# Patient Record
Sex: Female | Born: 1940 | Race: Asian | Hispanic: No | Smoking: Never smoker
Health system: Southern US, Community
[De-identification: ages and names within clinical notes are randomized; demographics above are authoritative.]

## PROBLEM LIST (undated history)

## (undated) DIAGNOSIS — I5042 Chronic combined systolic (congestive) and diastolic (congestive) heart failure: Secondary | ICD-10-CM

## (undated) DIAGNOSIS — E785 Hyperlipidemia, unspecified: Secondary | ICD-10-CM

## (undated) DIAGNOSIS — N183 Chronic kidney disease, stage 3 (moderate): Secondary | ICD-10-CM

## (undated) DIAGNOSIS — N2889 Other specified disorders of kidney and ureter: Secondary | ICD-10-CM

## (undated) DIAGNOSIS — D649 Anemia, unspecified: Secondary | ICD-10-CM

## (undated) DIAGNOSIS — R945 Abnormal results of liver function studies: Secondary | ICD-10-CM

## (undated) DIAGNOSIS — R079 Chest pain, unspecified: Secondary | ICD-10-CM

## (undated) DIAGNOSIS — R7303 Prediabetes: Secondary | ICD-10-CM

## (undated) DIAGNOSIS — I639 Cerebral infarction, unspecified: Secondary | ICD-10-CM

## (undated) DIAGNOSIS — G459 Transient cerebral ischemic attack, unspecified: Secondary | ICD-10-CM

## (undated) DIAGNOSIS — R001 Bradycardia, unspecified: Secondary | ICD-10-CM

## (undated) DIAGNOSIS — R7989 Other specified abnormal findings of blood chemistry: Secondary | ICD-10-CM

## (undated) DIAGNOSIS — I340 Nonrheumatic mitral (valve) insufficiency: Secondary | ICD-10-CM

## (undated) HISTORY — DX: Hyperlipidemia, unspecified: E78.5

## (undated) HISTORY — DX: Other specified disorders of kidney and ureter: N28.89

## (undated) HISTORY — DX: Chest pain, unspecified: R07.9

## (undated) HISTORY — DX: Anemia, unspecified: D64.9

## (undated) HISTORY — DX: Bradycardia, unspecified: R00.1

## (undated) HISTORY — PX: APPENDECTOMY: SHX54

## (undated) HISTORY — DX: Chronic combined systolic (congestive) and diastolic (congestive) heart failure: I50.42

## (undated) HISTORY — DX: Cerebral infarction, unspecified: I63.9

## (undated) HISTORY — DX: Other specified abnormal findings of blood chemistry: R79.89

## (undated) HISTORY — DX: Chronic kidney disease, stage 3 (moderate): N18.3

## (undated) HISTORY — DX: Nonrheumatic mitral (valve) insufficiency: I34.0

## (undated) HISTORY — DX: Abnormal results of liver function studies: R94.5

---

## 2015-09-03 HISTORY — PX: OTHER SURGICAL HISTORY: SHX169

## 2017-12-27 ENCOUNTER — Inpatient Hospital Stay (HOSPITAL_COMMUNITY)
Admission: EM | Admit: 2017-12-27 | Discharge: 2017-12-29 | DRG: 065 | Disposition: A | Discharge status: Home Health | Payer: Medicaid Other | Attending: Internal Medicine | Admitting: Internal Medicine

## 2017-12-27 ENCOUNTER — Emergency Department (HOSPITAL_COMMUNITY): Payer: Medicaid Other

## 2017-12-27 ENCOUNTER — Inpatient Hospital Stay (HOSPITAL_COMMUNITY): Payer: Medicaid Other

## 2017-12-27 ENCOUNTER — Other Ambulatory Visit: Payer: Self-pay

## 2017-12-27 ENCOUNTER — Encounter (HOSPITAL_COMMUNITY): Payer: Self-pay | Admitting: Emergency Medicine

## 2017-12-27 DIAGNOSIS — I63511 Cerebral infarction due to unspecified occlusion or stenosis of right middle cerebral artery: Secondary | ICD-10-CM | POA: Diagnosis not present

## 2017-12-27 DIAGNOSIS — I248 Other forms of acute ischemic heart disease: Secondary | ICD-10-CM | POA: Diagnosis present

## 2017-12-27 DIAGNOSIS — E1122 Type 2 diabetes mellitus with diabetic chronic kidney disease: Secondary | ICD-10-CM | POA: Diagnosis present

## 2017-12-27 DIAGNOSIS — R1310 Dysphagia, unspecified: Secondary | ICD-10-CM | POA: Diagnosis present

## 2017-12-27 DIAGNOSIS — I517 Cardiomegaly: Secondary | ICD-10-CM | POA: Diagnosis present

## 2017-12-27 DIAGNOSIS — R7989 Other specified abnormal findings of blood chemistry: Secondary | ICD-10-CM

## 2017-12-27 DIAGNOSIS — R471 Dysarthria and anarthria: Secondary | ICD-10-CM | POA: Diagnosis present

## 2017-12-27 DIAGNOSIS — R0609 Other forms of dyspnea: Secondary | ICD-10-CM | POA: Diagnosis present

## 2017-12-27 DIAGNOSIS — R748 Abnormal levels of other serum enzymes: Secondary | ICD-10-CM

## 2017-12-27 DIAGNOSIS — R5383 Other fatigue: Secondary | ICD-10-CM | POA: Diagnosis not present

## 2017-12-27 DIAGNOSIS — I10 Essential (primary) hypertension: Secondary | ICD-10-CM

## 2017-12-27 DIAGNOSIS — R2681 Unsteadiness on feet: Secondary | ICD-10-CM | POA: Diagnosis present

## 2017-12-27 DIAGNOSIS — Z79899 Other long term (current) drug therapy: Secondary | ICD-10-CM

## 2017-12-27 DIAGNOSIS — G8194 Hemiplegia, unspecified affecting left nondominant side: Secondary | ICD-10-CM | POA: Diagnosis present

## 2017-12-27 DIAGNOSIS — N183 Chronic kidney disease, stage 3 (moderate): Secondary | ICD-10-CM | POA: Diagnosis present

## 2017-12-27 DIAGNOSIS — R29701 NIHSS score 1: Secondary | ICD-10-CM | POA: Diagnosis present

## 2017-12-27 DIAGNOSIS — H538 Other visual disturbances: Secondary | ICD-10-CM | POA: Diagnosis present

## 2017-12-27 DIAGNOSIS — R531 Weakness: Secondary | ICD-10-CM | POA: Diagnosis not present

## 2017-12-27 DIAGNOSIS — R29810 Facial weakness: Secondary | ICD-10-CM | POA: Diagnosis present

## 2017-12-27 DIAGNOSIS — R4701 Aphasia: Secondary | ICD-10-CM | POA: Diagnosis present

## 2017-12-27 DIAGNOSIS — R778 Other specified abnormalities of plasma proteins: Secondary | ICD-10-CM

## 2017-12-27 DIAGNOSIS — G479 Sleep disorder, unspecified: Secondary | ICD-10-CM | POA: Diagnosis present

## 2017-12-27 DIAGNOSIS — I639 Cerebral infarction, unspecified: Secondary | ICD-10-CM | POA: Diagnosis not present

## 2017-12-27 DIAGNOSIS — R079 Chest pain, unspecified: Secondary | ICD-10-CM

## 2017-12-27 DIAGNOSIS — I129 Hypertensive chronic kidney disease with stage 1 through stage 4 chronic kidney disease, or unspecified chronic kidney disease: Secondary | ICD-10-CM | POA: Diagnosis present

## 2017-12-27 HISTORY — DX: Other specified abnormalities of plasma proteins: R77.8

## 2017-12-27 HISTORY — DX: Transient cerebral ischemic attack, unspecified: G45.9

## 2017-12-27 HISTORY — DX: Other specified abnormal findings of blood chemistry: R79.89

## 2017-12-27 HISTORY — DX: Prediabetes: R73.03

## 2017-12-27 LAB — CBC
HCT: 36.4 % (ref 36.0–46.0)
Hemoglobin: 12.1 g/dL (ref 12.0–15.0)
MCH: 32 pg (ref 26.0–34.0)
MCHC: 33.2 g/dL (ref 30.0–36.0)
MCV: 96.3 fL (ref 78.0–100.0)
PLATELETS: 180 10*3/uL (ref 150–400)
RBC: 3.78 MIL/uL — AB (ref 3.87–5.11)
RDW: 13.5 % (ref 11.5–15.5)
WBC: 6.5 10*3/uL (ref 4.0–10.5)

## 2017-12-27 LAB — I-STAT CHEM 8, ED
BUN: 17 mg/dL (ref 6–20)
CALCIUM ION: 1.08 mmol/L — AB (ref 1.15–1.40)
CHLORIDE: 103 mmol/L (ref 101–111)
Creatinine, Ser: 1.1 mg/dL — ABNORMAL HIGH (ref 0.44–1.00)
Glucose, Bld: 107 mg/dL — ABNORMAL HIGH (ref 65–99)
HCT: 37 % (ref 36.0–46.0)
Hemoglobin: 12.6 g/dL (ref 12.0–15.0)
POTASSIUM: 4.2 mmol/L (ref 3.5–5.1)
SODIUM: 139 mmol/L (ref 135–145)
TCO2: 26 mmol/L (ref 22–32)

## 2017-12-27 LAB — PROTIME-INR
INR: 1.12
PROTHROMBIN TIME: 14.3 s (ref 11.4–15.2)

## 2017-12-27 LAB — DIFFERENTIAL
Basophils Absolute: 0 10*3/uL (ref 0.0–0.1)
Basophils Relative: 1 %
EOS PCT: 1 %
Eosinophils Absolute: 0 10*3/uL (ref 0.0–0.7)
LYMPHS PCT: 32 %
Lymphs Abs: 2.1 10*3/uL (ref 0.7–4.0)
Monocytes Absolute: 0.5 10*3/uL (ref 0.1–1.0)
Monocytes Relative: 8 %
NEUTROS ABS: 3.8 10*3/uL (ref 1.7–7.7)
NEUTROS PCT: 58 %

## 2017-12-27 LAB — COMPREHENSIVE METABOLIC PANEL
ALBUMIN: 3.5 g/dL (ref 3.5–5.0)
ALK PHOS: 86 U/L (ref 38–126)
ALT: 25 U/L (ref 14–54)
ANION GAP: 10 (ref 5–15)
AST: 41 U/L (ref 15–41)
BUN: 16 mg/dL (ref 6–20)
CALCIUM: 9 mg/dL (ref 8.9–10.3)
CHLORIDE: 101 mmol/L (ref 101–111)
CO2: 24 mmol/L (ref 22–32)
Creatinine, Ser: 1.16 mg/dL — ABNORMAL HIGH (ref 0.44–1.00)
GFR calc Af Amer: 52 mL/min — ABNORMAL LOW (ref >60)
GFR calc non Af Amer: 45 mL/min — ABNORMAL LOW (ref >60)
Glucose, Bld: 108 mg/dL — ABNORMAL HIGH (ref 65–99)
Potassium: 4.1 mmol/L (ref 3.5–5.1)
SODIUM: 135 mmol/L (ref 135–145)
Total Bilirubin: 1.6 mg/dL — ABNORMAL HIGH (ref 0.3–1.2)
Total Protein: 7.7 g/dL (ref 6.5–8.1)

## 2017-12-27 LAB — TROPONIN I
TROPONIN I: 0.1 ng/mL — AB (ref <0.03)
TROPONIN I: 0.13 ng/mL — AB (ref <0.03)

## 2017-12-27 LAB — APTT: aPTT: 31 seconds (ref 24–36)

## 2017-12-27 LAB — I-STAT TROPONIN, ED: Troponin i, poc: 0.1 ng/mL (ref 0.00–0.08)

## 2017-12-27 MED ORDER — IOPAMIDOL (ISOVUE-370) INJECTION 76%
50.0000 mL | Freq: Once | INTRAVENOUS | Status: AC | PRN
  Administered 2017-12-27: 50 mL via INTRAVENOUS

## 2017-12-27 MED ORDER — SODIUM CHLORIDE 0.9 % IV SOLN
INTRAVENOUS | Status: DC
  Administered 2017-12-27: 50 mL/h via INTRAVENOUS
  Administered 2017-12-28: 18:00:00 via INTRAVENOUS

## 2017-12-27 MED ORDER — ASPIRIN EC 81 MG PO TBEC
81.0000 mg | DELAYED_RELEASE_TABLET | Freq: Every day | ORAL | Status: DC
Start: 2017-12-27 — End: 2017-12-29
  Administered 2017-12-29: 81 mg via ORAL
  Filled 2017-12-27: qty 1

## 2017-12-27 MED ORDER — STROKE: EARLY STAGES OF RECOVERY BOOK
Freq: Once | Status: DC
  Filled 2017-12-27: qty 1

## 2017-12-27 MED ORDER — ASPIRIN 325 MG PO TABS: 325.0000 mg | ORAL_TABLET | Freq: Every day | ORAL | Status: DC

## 2017-12-27 MED ORDER — ASPIRIN 300 MG RE SUPP
300.0000 mg | Freq: Every day | RECTAL | Status: DC
  Administered 2017-12-27: 300 mg via RECTAL
  Filled 2017-12-27: qty 1

## 2017-12-27 MED ORDER — ATORVASTATIN CALCIUM 40 MG PO TABS
40.0000 mg | ORAL_TABLET | Freq: Every day | ORAL | Status: DC
  Administered 2017-12-28: 40 mg via ORAL
  Filled 2017-12-27 (×2): qty 1

## 2017-12-27 MED ORDER — IOPAMIDOL (ISOVUE-370) INJECTION 76%
INTRAVENOUS | Status: AC
  Filled 2017-12-27: qty 50

## 2017-12-27 NOTE — ED Triage Notes (Signed)
Family reports Pt had surgery on the Lt  Index finger and  Lt middle finger . Pt normal base line is to not bend those two fingers.  The grip is weak with the remaining fingers.

## 2017-12-27 NOTE — Consult Note (Signed)
Patient ID: Mikayla Reynolds MRN: [id number], DOB/AGE: 76-Aug-1942  Admit date: 12/27/2017 Primary Physician: Patient, No Pcp Per Primary Cardiologist: None  CARDIOLOGY ADMISSION History & Physical   HISTORY OF PRESENT ILLNESS   77 year old woman with history of TIA, hypertension, who presents to the ED with acute CVA. Cardiology is consulted for evaluation of elevated troponin.  Patient is [place]. Family interpreted for her. Thursday she was found to have confusion, slurred speech, and left sided weakness. She refused to seek medical care because she thought she would improve, but today when her symptoms persisted they brought her to ED. 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  She is comfortable and pleasant. No CP, SOB, edema, PND, syncope or presyncope. She lives with her son. She recently moved from [place]. She rarely leaves the house and is not physically active. She had a cold two weeks ago with some chest discomfort and cough, but that subsequently improved.  Review of Systems General:  No chills, fever, night sweats or weight changes.  Cardiovascular: see above Dermatological: No rash, lesions/masses Respiratory: see above Urologic: No hematuria, dysuria Abdominal:   No nausea, vomiting, diarrhea, bright red blood per rectum, melena, or hematemesis Neurologic:  See above All other systems reviewed and are otherwise negative except as noted above.   MEDICAL, FAMILY, AND SOCIAL HISTORY   Past Medical History:  Diagnosis Date  . Pre-diabetes   . TIA (transient ischemic attack)     Past Surgical History:  Procedure Laterality Date  . APPENDECTOMY      No Known Allergies Prior to Admission medications   Medication Sig Start Date End Date Taking? Authorizing Provider  Omega-3 Fatty Acids (FISH OIL PO) Take 2 capsules by mouth every evening.   Yes [provider]  PRESCRIPTION MEDICATION Take 20 mg by mouth See admin instructions. "Vastarel" 20 mg -trimetazidine  dihydrochloride - from [place] pharmacy/ take one tablet (20 mg) twice daily   Yes [provider]  PRESCRIPTION MEDICATION Take 4 mg by mouth See admin instructions. Candesartan 8 mg - from [place] pharmacy - take 1/2 tablet (4 mg) by mouth every morning   Yes [provider]  PRESCRIPTION MEDICATION Take 0.25 mg by mouth See admin instructions. Alprazolam 0.5 mg - from [place] pharmacy/ take 1/2 tablet (0.25 mg) by mouth daily at bedtime   Yes [provider]   History reviewed. No pertinent family history. Social History   Socioeconomic History  . Marital status: Widowed    Spouse name: Not on file  . Number of children: Not on file  . Years of education: Not on file  . Highest education level: Not on file  Occupational History  . Not on file  Social Needs  . Financial resource strain: Not on file  . Food insecurity:    Worry: Not on file    Inability: Not on file  . Transportation needs:    Medical: Not on file    Non-medical: Not on file  Tobacco Use  . Smoking status: Never Smoker  . Smokeless tobacco: Never Used  Substance and Sexual Activity  . Alcohol use: Not on file  . Drug use: Not on file  . Sexual activity: Not on file  Lifestyle  . Physical activity:    Days per week: Not on file    Minutes per session: Not on file  . Stress: Not on file  Relationships  . Social connections:    Talks on phone: Not on file  Gets together: Not on file    Attends religious service: Not on file    Active member of club or organization: Not on file    Attends meetings of clubs or organizations: Not on file    Relationship status: Not on file  . Intimate partner violence:    Fear of current or ex partner: Not on file    Emotionally abused: Not on file    Physically abused: Not on file    Forced sexual activity: Not on file  Other Topics Concern  . Not on file  Social History Narrative  . Not on file      PHYSICAL EXAM  Blood pressure 119/70,  pulse 61, temperature 97.8 F (36.6 C), temperature source Oral, resp. rate 20, height 5\' 3"  (1.6 m), weight 54.2 kg (119 lb 7.8 oz), SpO2 98 %.  General: Pleasant, NAD Psych: Normal affect. Neuro: left sided facial droop. HEENT: Sclera anicteric, noninjected. MMM.  Neck: JVP is not elevated. Lungs:  clear Heart: RRR no m/r/g Abdomen: Soft, non-tender, non-distended, BS + x 4.  Extremities: No clubbing or cyanosis. Edema: none. DP/PT/Radials 2+ and equal bilaterally.   LABS and STUDIES  Troponin (Point of Care Test) Recent Labs    12/27/17 1315  TROPIPOC 0.10*   Recent Labs    12/27/17 1630 12/27/17 2139  TROPONINI 0.10* 0.13*   Lab Results  Component Value Date   WBC 6.5 12/27/2017   HGB 12.6 12/27/2017   HCT 37.0 12/27/2017   MCV 96.3 12/27/2017   PLT 180 12/27/2017    Recent Labs  Lab 12/27/17 1252 12/27/17 1316  NA 135 139  K 4.1 4.2  CL 101 103  CO2 24  --   BUN 16 17  CREATININE 1.16* 1.10*  CALCIUM 9.0  --   PROT 7.7  --   BILITOT 1.6*  --   ALKPHOS 86  --   ALT 25  --   AST 41  --   GLUCOSE 108* 107*   No results found for: CHOL, HDL, LDLCALC, TRIG No results found for: DDIMER   Radiology/Studies. Independently Reviewed CXR shows clear lungs  ECG was independently reviewed. Sinus with PACs    ASSESSMENT and PLAN   77 year old woman with history of HTN, TIAs, who presents with MCA infarct. Troponin mildly elevated.  Mild troponin elevation. May be 2/2 demand ischemia (CVA). No signs of active ischemia or acute coronary syndrome. - Aspirin 81 mg daily - Atorvastatin 80 mg daily or other high potency statin - Please check lipids - Do not recommend heparin given low suspicion for ACS and potential for hemorrhagic conversion of recent stroke - Echocardiogram ordered - Consider ischemic evaluation (inpatient vs outpatient) - Continue to trend troponin until downtrending (then stop)  Recent CVA Outside of window for TPA or intervention -  Recommendations per neurology - Will need to evaluate for arhythmia (afib)   Hypertension - Per primary team and neurology   PAULEY, ERIC D, MD 12/27/2017, 10:38 PM

## 2017-12-27 NOTE — ED Notes (Signed)
Neuro MD at bedside with family.

## 2017-12-27 NOTE — ED Triage Notes (Signed)
Pt speaks vietnamese. Thursday evening her family noted she had sudden onset slurred speech, some confusion, expressive aphasia, right sided facial droop, disoriented to month at present, thinks it is march. Unable to answer her age but can speak to DOB. Some left arm and left hand grip weakness noted. Pt family also states she has trouble swallowing.

## 2017-12-27 NOTE — Consult Note (Addendum)
Referring Physician: Gareth Morgan, MD    Chief Complaint: Aphasia; difficulty swallowing; and Altered Mental Status   HPI: Patriece Reynolds is an 77 y.o. [place] female with a past medical history of 2 previous TIAs, hypertension who presents to the ED with a 2-day complaint of left sided hemiparesis, left paresthesia, dysphagia, dysarthria left facial droop and impaired mobility.  Patient is [place] speaking and she refused for iPad [place] interpreter, and opted for granddaughter at the bedside to interpret for her. Per family, patient's son last saw her at 1 PM on Wednesday, 12/25/2017, he went out and returned around 8 PM.  At that time patient was confused, was speaking gibberish, with slurred speech, left-sided facial droop, unsteady gait but was still walking around as well as left sided weakness.  At baseline patient is left-handed, when she was eating dinner she was unable to hold the spoon in her left hand and had drooling of the fluid out of the left side of her mouth with some difficulty swallowing.  Patient refused to come to the ER per family but they just had to bring her in today due to the persistency of her symptoms. Per patient through interpretation by her granddaughter, she had a headache starting late Thursday, which continues to be persistent up until now, difficulty sleeping, blurred vision which has since resolved, denies trauma or falls. Per family patient has had 2 previous TIAs, first 1 in [place] 2 years ago with the exact same symptoms which resolved spontaneously, second one was about 9 months ago with the exact same symptoms, patient refused to go to the hospital at that time and by the time she had woken up the next morning symptoms had resolved. On admission to the ER patient is very compliant, does answer questions appropriately, per family continues to have slurred speech, but more lucid with more coherent speech.  Initial CT of the head done in the ER showed  acute right MCA distribution infarct.  Neuro was consulted for further evaluation.  Patient denies dizziness, nausea, vomiting she admits to intermittent chest pain and family says has some anxiety.  Troponin on admission was elevated at 0.1, EKG was normal sinus rhythm no ST-t wave changes consistent with ischemia, chest x-ray pending, normal electrolytes within normal limits.  LSN: 12/25/17 at 1pm tPA Given: No: Out of the time frame window  Premorbid modified Rankin scale (mRS): 1   Past Medical History:  Diagnosis Date  . Pre-diabetes   . TIA (transient ischemic attack)   HTN  Past Surgical History:  Procedure Laterality Date  . APPENDECTOMY     Family hx; Discussed with patient and family, deny hx of Stroke  Social History:  reports that she has never smoked. She has never used smokeless tobacco. Her alcohol and drug histories are not on file.  Allergies: No Known Allergies  Medications:  .  stroke: mapping our early stages of recovery book   Does not apply Once    ROS: 10 point systems reviewed with patient and are negative except for above in HPI.  Physical Examination: Blood pressure (!) 123/57, pulse (!) 59, temperature 98 F (36.7 C), resp. rate 20, height _0  (1.626 m), weight 60 kg (132 lb 4.4 oz), SpO2 99 %. HEENT-  Normocephalic, no lesions, without obvious abnormality.  Normal external eye and conjunctiva.   Cardiovascular- S1-S2 audible, pulses palpable throughout   Lungs-no rhonchi or wheezing noted, no excessive working breathing.  Saturations within normal limits Abdomen- All 4 quadrants  palpated and nontender Musculoskeletal-no joint tenderness, deformity or swelling Skin-warm and dry,   Neurological Examination Mental Status: Alert, oriented, to self place time and situation.  Patient is [place] speaking with mild dysarthria without evidence of aphasia.  Able to follow 3 step commands without difficulty. Cranial Nerves: II: Visual fields grossly  normal,  III,IV, VI: ptosis not present, extra-ocular motions intact bilaterally pupils equal, round, reactive to light and accommodation V,VII: Left facial droop with decreased sensation to sharp and dull, decreased sensation to light touch. VIII: Hearing intact to voice IX,X: uvula rises symmetrically XI: bilateral shoulder shrug XII: midline tongue extension Motor: Patient with good hip flexion, abduction and abduction bilaterally, at baseline patient is unable to bend left hand index and middle finger due to surgery in [place] many years ago.  Has impaired left hand 5 motor skills.   Right : Upper extremity   5/5    Left:     Upper extremity   3/5  Lower extremity   5/5     Lower extremity   4/5 Tone and bulk:normal tone throughout; no atrophy noted Sensory: Left hemiparesis Deep Tendon Reflexes: 2+ and symmetric throughout Plantars: Right: downgoing   Left: downgoing Cerebellar: normal finger-to-nose, normal rapid alternating movements and normal heel-to-shin test Gait: Not assessed secondary to safety    Results for orders placed or performed during the hospital encounter of 12/27/17 (from the past 48 hour(s))  Protime-INR     Status: None   Collection Time: 12/27/17 12:52 PM  Result Value Ref Range   Prothrombin Time 14.3 11.4 - 15.2 seconds   INR 1.12     Comment: Performed at [facility], [place] [address] [place], [place] [address]  APTT     Status: None   Collection Time: 12/27/17 12:52 PM  Result Value Ref Range   aPTT 31 24 - 36 seconds    Comment: Performed at [facility] [address] [place], [place] [address]  CBC     Status: Abnormal   Collection Time: 12/27/17 12:52 PM  Result Value Ref Range   WBC 6.5 4.0 - 10.5 K/uL   RBC 3.78 (L) 3.87 - 5.11 MIL/uL   Hemoglobin 12.1 12.0 - 15.0 g/dL   HCT 36.4 36.0 - 46.0 %   MCV 96.3 78.0 - 100.0 fL   MCH 32.0 26.0 - 34.0 pg   MCHC 33.2 30.0 - 36.0 g/dL   RDW 13.5 11.5 - 15.5 %   Platelets 180 150 -  400 K/uL    Comment: Performed at [facility], [place] [address] [place], [place] [address]  Differential     Status: None   Collection Time: 12/27/17 12:52 PM  Result Value Ref Range   Neutrophils Relative % 58 %   Neutro Abs 3.8 1.7 - 7.7 K/uL   Lymphocytes Relative 32 %   Lymphs Abs 2.1 0.7 - 4.0 K/uL   Monocytes Relative 8 %   Monocytes Absolute 0.5 0.1 - 1.0 K/uL   Eosinophils Relative 1 %   Eosinophils Absolute 0.0 0.0 - 0.7 K/uL   Basophils Relative 1 %   Basophils Absolute 0.0 0.0 - 0.1 K/uL    Comment: Performed at [facility] [address] [place],  [address]  Comprehensive metabolic panel     Status: Abnormal   Collection Time: 12/27/17 12:52 PM  Result Value Ref Range   Sodium 135 135 - 145 mmol/L   Potassium 4.1 3.5 -  5.1 mmol/L   Chloride 101 101 - 111 mmol/L   CO2 24 22 - 32 mmol/L   Glucose, Bld 108 (H) 65 - 99 mg/dL   BUN 16 6 - 20 mg/dL   Creatinine, Ser 1.16 (H) 0.44 - 1.00 mg/dL   Calcium 9.0 8.9 - 10.3 mg/dL   Total Protein 7.7 6.5 - 8.1 g/dL   Albumin 3.5 3.5 - 5.0 g/dL   AST 41 15 - 41 U/L   ALT 25 14 - 54 U/L   Alkaline Phosphatase 86 38 - 126 U/L   Total Bilirubin 1.6 (H) 0.3 - 1.2 mg/dL   GFR calc non Af Amer 45 (L) >60 mL/min   GFR calc Af Amer 52 (L) >60 mL/min    Comment: (NOTE) The eGFR has been calculated using the CKD EPI equation. This calculation has not been validated in all clinical situations. eGFR's persistently <60 mL/min signify possible Chronic Kidney Disease.    Anion gap 10 5 - 15    Comment: Performed at [facility] [address] [place], [place] [address]  I-stat troponin, ED     Status: Abnormal   Collection Time: 12/27/17  1:15 PM  Result Value Ref Range   Troponin i, poc 0.10 (HH) 0.00 - 0.08 ng/mL   Comment 3            Comment: Due to the release kinetics of cTnI, a negative result within the first hours of the onset of symptoms does not rule out myocardial infarction with  certainty. If myocardial infarction is still suspected, repeat the test at appropriate intervals.   I-Stat Chem 8, ED     Status: Abnormal   Collection Time: 12/27/17  1:16 PM  Result Value Ref Range   Sodium 139 135 - 145 mmol/L   Potassium 4.2 3.5 - 5.1 mmol/L   Chloride 103 101 - 111 mmol/L   BUN 17 6 - 20 mg/dL   Creatinine, Ser 1.10 (H) 0.44 - 1.00 mg/dL   Glucose, Bld 107 (H) 65 - 99 mg/dL   Calcium, Ion 1.08 (L) 1.15 - 1.40 mmol/L   TCO2 26 22 - 32 mmol/L   Hemoglobin 12.6 12.0 - 15.0 g/dL   HCT 37.0 36.0 - 46.0 %   Ct Head Wo Contrast 12/27/2017 IMPRESSION:  Acute right MCA distribution infarct. Moderate confluent infarct centered at the right insula and frontal operculum. Patchy infarct in the right frontal parietal cortex. Negative for hemorrhage. Electronically Signed   By: Monte Fantasia M.D.   On: 12/27/2017 14:53    Assessment: 77 y.o.  [place] female with a past medical history of 2 previous TIAs, hypertension who presents to the ED with a 2-day complaint of left sided hemiparesis, left paresthesia, dysphagia, dysarthria, left facial droop and impaired mobility.  1.  Left acute right MCA infarct.  Essentially started 2 days ago, and presented to the hospital today with symptoms above. CTA shows R M1 occlusion, unfortunately outside window for any intervention ( tPA or thrombectomy)  Will pursue full stroke work-up with MRI of the brain, echocardiogram to rule out cardiogenic source.  Continue secondary stroke prevention with aspirin 81 mg and atorvastatin 40 mg.  Continue early rehabilitation with physical therapy outpatient therapy and speech therapy.  Patient currently has reported dizziness aphasia per family, will recommend early swallow evaluation.  Continue monitoring on telemetry as well as neuro monitoring with frequent neuro checks.  Lipid panel and hemoglobin A1c pending Stroke Risk Factors - hyperlipidemia,  hypertension and 2 previous TIAs  2.  Hypertension 3.   Elevated troponin 4.  Prediabetes with glucose intolerance -  awaiting hemoglobin A1c  Plan: - HgbA1c, fasting lipid panel - CTA of the head and neck   - PT consult, OT consult, Speech consult - Echocardiogram - Prophylactic therapy-Antiplatelet med: Aspirin - dose 73m - Atorvastatin 436m-Check troponin every 6 hours x3 - Allow for permissive hypertension for the first 24-48h - only treat PRN if SBP >220 mmHg.  Blood pressures can be gradually normalized to SBP<140 upon discharge - Risk factor modification - Telemetry monitoring -  Frequent neuro checks   PeLetha CapeNP Neuro-hospitalist Team [phone]/27/2019, 4:01 PM     NEUROHOSPITALIST ADDENDUM Seen and examined the patient today. I have reviewed the contents of history and physical exam as documented by PA/ARNP/Resident and agree with above documentation.  I have discussed and formulated the above plan as documented. Edits to the note have been made as needed.    SuKarena Addisonroor MD [facility] [phone] If 7pm to 7am, please call on call as listed on AMION.

## 2017-12-27 NOTE — ED Provider Notes (Signed)
[facility] EMERGENCY DEPARTMENT Provider Note   CSN: [id number] Arrival date & time: 12/27/17  1208     History   Chief Complaint Chief Complaint  Patient presents with  . Aphasia  . difficulty swallowing  . Altered Mental Status    HPI Mikayla Reynolds is a 77 y.o. female.  HPI   Family reports saw her Thursday am and she was at baseline, however returned home Thursday evening and she had left sided facial droop, seemed like slurred speech Thursday evening developed left sided facial droop, difficulty communicating, seemed altered AMS resolved but developed dysarthria and dysphagia, inability to move left upper extremity, reports couldn't move it then couldn't move it correctly. Did not improve. Had similar symptoms months ago that resolved spontaneously and did not seek medical care.  Granddaughter not sure of medical problems. Intermittently altered and not following commands. History limited by patient's mental status. Has DM that is diet controlled, htn, heart problem of some kind but granddaughter not sure.   Past Medical History:  Diagnosis Date  . Pre-diabetes   . TIA (transient ischemic attack)     Patient Active Problem List   Diagnosis Date Noted  . Stroke ([place]) 12/27/2017  . Elevated troponin 12/27/2017  . Chest pain 12/27/2017    Past Surgical History:  Procedure Laterality Date  . APPENDECTOMY       OB History   None      Home Medications    Prior to Admission medications   Medication Sig Start Date End Date Taking? Authorizing Provider  Omega-3 Fatty Acids (FISH OIL PO) Take 2 capsules by mouth every evening.   Yes [provider]    Family History History reviewed. No pertinent family history.  Social History Social History   Tobacco Use  . Smoking status: Never Smoker  . Smokeless tobacco: Never Used  Substance Use Topics  . Alcohol use: Not on file  . Drug use: Not on file     Allergies   Patient has  no known allergies.   Review of Systems Review of Systems  Unable to perform ROS: Mental status change  Constitutional: Positive for fatigue. Negative for fever (granddaughter did not think so).  Respiratory: Negative for shortness of breath.   Cardiovascular: Positive for chest pain (intermittent over time, none today).  Gastrointestinal: Negative for abdominal pain and nausea.  Neurological: Positive for facial asymmetry, speech difficulty, weakness, numbness and headaches (bifrontal, constant). Negative for dizziness and light-headedness.     Physical Exam Updated Vital Signs BP 126/69   Pulse 64   Temp 98 F (36.7 C)   Resp 14   Ht 5\' 4"  (1.626 m)   Wt 60 kg (132 lb 4.4 oz)   SpO2 100%   BMI 22.71 kg/m   Physical Exam  Constitutional: She appears well-developed and well-nourished. No distress.  HENT:  Head: Normocephalic and atraumatic.  Eyes: Conjunctivae and EOM are normal.  Neck: Normal range of motion.  Cardiovascular: Normal rate, regular rhythm, normal heart sounds and intact distal pulses. Exam reveals no gallop and no friction rub.  No murmur heard. Pulmonary/Chest: Effort normal and breath sounds normal. No respiratory distress. She has no wheezes. She has no rales.  Abdominal: Soft. She exhibits no distension. There is no tenderness. There is no guarding.  Musculoskeletal: She exhibits no edema or tenderness.  Neurological: She is alert. A cranial nerve deficit (left facial droop) and sensory deficit (left upper and lower extremities) is present. Coordination (left  upper and lower likely limited by strength) abnormal.  Left upper extremity numbness Left facial droop   Skin: Skin is warm and dry. No rash noted. She is not diaphoretic. No erythema.  Nursing note and vitals reviewed.    ED Treatments / Results  Labs (all labs ordered are listed, but only abnormal results are displayed) Labs Reviewed  CBC - Abnormal; Notable for the following components:        Result Value   RBC 3.78 (*)    All other components within normal limits  COMPREHENSIVE METABOLIC PANEL - Abnormal; Notable for the following components:   Glucose, Bld 108 (*)    Creatinine, Ser 1.16 (*)    Total Bilirubin 1.6 (*)    GFR calc non Af Amer 45 (*)    GFR calc Af Amer 52 (*)    All other components within normal limits  TROPONIN I - Abnormal; Notable for the following components:   Troponin I 0.10 (*)    All other components within normal limits  I-STAT TROPONIN, ED - Abnormal; Notable for the following components:   Troponin i, poc 0.10 (*)    All other components within normal limits  I-STAT CHEM 8, ED - Abnormal; Notable for the following components:   Creatinine, Ser 1.10 (*)    Glucose, Bld 107 (*)    Calcium, Ion 1.08 (*)    All other components within normal limits  PROTIME-INR  APTT  DIFFERENTIAL  TROPONIN I  TROPONIN I  HEMOGLOBIN A1C  LIPID PANEL  HEMOGLOBIN A1C  LIPID PANEL  COMPREHENSIVE METABOLIC PANEL  CBC  CBG MONITORING, ED    EKG EKG Interpretation  Date/Time:  Saturday December 27 2017 12:49:33 EDT Ventricular Rate:  60 PR Interval:  146 QRS Duration: 102 QT Interval:  516 QTC Calculation: 516 R Axis:   -19 Text Interpretation:  Suspect sinus rhythm, artifact makes interpretation difficult Low voltage QRS Cannot rule out Anterior infarct , age undetermined Abnormal ECG No previous ECGs available Confirmed by Gareth Morgan [phone]) on 12/27/2017 1:32:21 PM   Radiology Ct Angio Head W Or Wo Contrast  Result Date: 12/27/2017 CLINICAL DATA:  Right MCA distribution stroke. EXAM: CT ANGIOGRAPHY HEAD AND NECK TECHNIQUE: Multidetector CT imaging of the head and neck was performed using the standard protocol during bolus administration of intravenous contrast. Multiplanar CT image reconstructions and MIPs were obtained to evaluate the vascular anatomy. Carotid stenosis measurements (when applicable) are obtained utilizing NASCET criteria,  using the distal internal carotid diameter as the denominator. CONTRAST:  51mL ISOVUE-370 IOPAMIDOL (ISOVUE-370) INJECTION 76% COMPARISON:  Noncontrast head CT from earlier today FINDINGS: CTA NECK FINDINGS Aortic arch: Atherosclerotic plaque.  Three vessel branching. Right carotid system: Limited atheromatous changes. No stenosis or ulceration. Left carotid system: No noted atheromatous changes. No stenosis or ulceration. Vertebral arteries: No proximal subclavian stenosis. Mild left vertebral artery dominance. The vertebral arteries are smooth and diffusely patent to the dura Skeleton: No acute or aggressive finding Other neck: Negative Upper chest: No acute finding Review of the MIP images confirms the above findings CTA HEAD FINDINGS Anterior circulation: Atherosclerotic plaque on the carotid siphons without flow limiting stenosis. Right distal M1 occlusion. There is prompt reconstitution but the subsequent branches are under filled compared to the left. Posterior circulation: Vertebrobasilar arteries are smooth and diffusely patent. Robust flow in bilateral posterior cerebral arteries. Negative for aneurysm Venous sinuses: Negative Anatomic variants: None significant Delayed phase: No abnormal intracranial enhancement These results were called by telephone  at the time of interpretation on 12/27/2017 at 6:06 pm to Dr. Jacob Moores , who verbally acknowledged these results. Review of the MIP images confirms the above findings IMPRESSION: 1. Right distal M1 occlusion. Right M2 and M3 branch reconstitution with underfilling compared to the left. 2. No embolic source identified. Mild atheromatous changes without atheromatous stenosis. Electronically Signed   By: Monte Fantasia M.D.   On: 12/27/2017 18:07   Dg Chest 2 View  Result Date: 12/27/2017 CLINICAL DATA:  77 y.o. [place] female with a past medical history of 2 previous TIAs, hypertension who presents to the ED with a 2-day complaint of left sided  hemiparesis, left paresthesia, dysphagia, dysarthria left facial droop and impaired mobility EXAM: CHEST - 2 VIEW COMPARISON:  None. FINDINGS: Cardiac silhouette is mildly enlarged. No mediastinal or hilar masses. No evidence of adenopathy. Mild prominence of the bronchovascular markings diffusely. Lungs are clear. No pleural effusion or pneumothorax. Skeletal structures are demineralized but intact. IMPRESSION: No acute cardiopulmonary disease.  Mild cardiomegaly. Electronically Signed   By: Lajean Manes M.D.   On: 12/27/2017 18:44   Ct Head Wo Contrast  Result Date: 12/27/2017 CLINICAL DATA:  Sudden onset of slurred speech. EXAM: CT HEAD WITHOUT CONTRAST TECHNIQUE: Contiguous axial images were obtained from the base of the skull through the vertex without intravenous contrast. COMPARISON:  None. FINDINGS: Brain: Moderate confluent cortically based infarct in the anterior right insula and frontal operculum. Discrete patchy infarct in the posterior right putamen and along the right frontal parietal cortex. No hemorrhage, hydrocephalus, or masslike finding. Vascular: No definite hyperdense vessel. Tiny area of high density near the right M1 segment on coronal reformats is not localized or persist on the sagittal reformats. Skull: No acute or aggressive finding Sinuses/Orbits: Negative Other: These results were called by telephone at the time of interpretation on 12/27/2017 at 2:45 pm to Dr. Gareth Morgan , who verbally acknowledged these results. IMPRESSION: Acute right MCA distribution infarct. Moderate confluent infarct centered at the right insula and frontal operculum. Patchy infarct in the right frontal parietal cortex. Negative for hemorrhage. Electronically Signed   By: Monte Fantasia M.D.   On: 12/27/2017 14:53   Ct Angio Neck W Or Wo Contrast  Result Date: 12/27/2017 CLINICAL DATA:  Right MCA distribution stroke. EXAM: CT ANGIOGRAPHY HEAD AND NECK TECHNIQUE: Multidetector CT imaging of the head  and neck was performed using the standard protocol during bolus administration of intravenous contrast. Multiplanar CT image reconstructions and MIPs were obtained to evaluate the vascular anatomy. Carotid stenosis measurements (when applicable) are obtained utilizing NASCET criteria, using the distal internal carotid diameter as the denominator. CONTRAST:  18mL ISOVUE-370 IOPAMIDOL (ISOVUE-370) INJECTION 76% COMPARISON:  Noncontrast head CT from earlier today FINDINGS: CTA NECK FINDINGS Aortic arch: Atherosclerotic plaque.  Three vessel branching. Right carotid system: Limited atheromatous changes. No stenosis or ulceration. Left carotid system: No noted atheromatous changes. No stenosis or ulceration. Vertebral arteries: No proximal subclavian stenosis. Mild left vertebral artery dominance. The vertebral arteries are smooth and diffusely patent to the dura Skeleton: No acute or aggressive finding Other neck: Negative Upper chest: No acute finding Review of the MIP images confirms the above findings CTA HEAD FINDINGS Anterior circulation: Atherosclerotic plaque on the carotid siphons without flow limiting stenosis. Right distal M1 occlusion. There is prompt reconstitution but the subsequent branches are under filled compared to the left. Posterior circulation: Vertebrobasilar arteries are smooth and diffusely patent. Robust flow in bilateral posterior cerebral arteries. Negative for aneurysm  Venous sinuses: Negative Anatomic variants: None significant Delayed phase: No abnormal intracranial enhancement These results were called by telephone at the time of interpretation on 12/27/2017 at 6:06 pm to Dr. Jacob Moores , who verbally acknowledged these results. Review of the MIP images confirms the above findings IMPRESSION: 1. Right distal M1 occlusion. Right M2 and M3 branch reconstitution with underfilling compared to the left. 2. No embolic source identified. Mild atheromatous changes without atheromatous stenosis.  Electronically Signed   By: Monte Fantasia M.D.   On: 12/27/2017 18:07    Procedures Procedures (including critical care time)  Medications Ordered in ED Medications   stroke: mapping our early stages of recovery book (has no administration in time range)  aspirin suppository 300 mg (has no administration in time range)    Or  aspirin tablet 325 mg (has no administration in time range)   stroke: mapping our early stages of recovery book (has no administration in time range)  iopamidol (ISOVUE-370) 76 % injection (has no administration in time range)  aspirin EC tablet 81 mg (has no administration in time range)  atorvastatin (LIPITOR) tablet 40 mg (has no administration in time range)  iopamidol (ISOVUE-370) 76 % injection 50 mL (50 mLs Intravenous Contrast Given 12/27/17 1749)     Initial Impression / Assessment and Plan / ED Course  I have reviewed the triage vital signs and the nursing notes.  Pertinent labs & imaging results that were available during my care of the patient were reviewed by me and considered in my medical decision making (see chart for details).     77yo female with history of diet controlled DM, hx of likely TIAs, htn presents with concern for left facial droop, left sided weakness and numbness and slurred speech.  Code stroke not called given last known normal days ago.  History and exam concerning for CVA. CT confirms large right MCA stroke. Neurology consulted.  Admitted to hospitalist for further care.   Final Clinical Impressions(s) / ED Diagnoses   Final diagnoses:  Chest pain  Acute ischemic right MCA stroke [facility])    ED Discharge Orders    None       Gareth Morgan, MD 12/27/17 [phone]

## 2017-12-27 NOTE — H&P (Signed)
[facility] H&P   Patient Demographics:    Mikayla Reynolds, is a 77 y.o. female  MRN: [id number]   DOB - [birth year]  Admit Date - 12/27/2017  Outpatient Primary MD for the patient is No primary care provider on file.  Referring MD/NP/PA: Billy Fischer  Outpatient Specialists:    Patient coming from: home  Chief Complaint  Patient presents with  . Aphasia  . difficulty swallowing  . Altered Mental Status      HPI:    Mikayla Reynolds  is a 77 y.o. female, w ?Glucose intolerance, has tia like symptoms over the past 1 month.  Pt has c/o left arm/ left weakness along with dysphagia, and left facial droop for the past 2 days.  Unable to hold a cup is what prompted her to come in.   Pt also admits to chest pain intermittently over the past 1 month, as well as dyspnea on exertion.    In Ed,  CT brain IMPRESSION: Acute right MCA distribution infarct. Moderate confluent infarct centered at the right insula and frontal operculum. Patchy infarct in the right frontal parietal cortex. Negative for hemorrhage.  INR 1.12 PTT 31 Wbc 6.5, Hgb 12.1, Plt 180 Na 135, K 4.1, Bun 16, Creatinie 1.16 Ast 41, Alt 25,   Trop 0.10  EKG nsr at 60, lad, poor R progression, no st-t changes c/w ischemia  CXR pending  Pt will be admitted for R MCA CVA, and for troponin elevation.      Review of systems:    In addition to the HPI above, + dysphagia, + dysarthria No Fever-chills, No Headache, No changes with Vision or hearing, No problems swallowing food or Liquids, No Cough  No Abdominal pain, No Nausea or Vommitting, Bowel movements are regular, No Blood in stool or Urine, No dysuria, No new skin rashes or bruises, No new joints pains-aches,  + weakness in the left upper and lower ext No recent weight gain or loss, No polyuria, polydypsia or polyphagia, No significant Mental Stressors.  A full  10 point Review of Systems was done, except as stated above, all other Review of Systems were negative.   With Past History of the following :    Past Medical History:  Diagnosis Date  . Pre-diabetes   . TIA (transient ischemic attack)       Past Surgical History:  Procedure Laterality Date  . APPENDECTOMY        Social History:     Social History   Tobacco Use  . Smoking status: Never Smoker  . Smokeless tobacco: Never Used  Substance Use Topics  . Alcohol use: Not on file     Lives - at home  Mobility - walks by self   Family History :    History reviewed. No pertinent family history. Her parents are both healthy,  Mother >33 years old and alive.  No  family hx of stroke   Home Medications:   Prior to Admission medications   Not on File     Allergies:    No Known Allergies   Physical Exam:   Vitals  Blood pressure 118/62, pulse 86, temperature 98.1 F (36.7 C), resp. rate 19, height 5\' 4"  (1.626 m), weight 60 kg (132 lb 4.4 oz), SpO2 100 %.   1. General  lying in bed in NAD,   2. Normal affect and insight, Not Suicidal or Homicidal, Awake Alert, Oriented X 3.  3. No F.N deficits, ALL C.Nerves Intact, Strength 5/5 right upper and lower ext 5-/5 in the left upper and left lower ext, Sensation intact all 4 extremities, Plantars down going on the right, equivocal on the left L facial droop Slight dysarthria ? aphasia  4. Ears and Eyes appear Normal, Conjunctivae clear, PERRLA. Moist Oral Mucosa.  5. Supple Neck, No JVD, No cervical lymphadenopathy appriciated, No Carotid Bruits.  6. Symmetrical Chest wall movement, Good air movement bilaterally, CTAB.  7. RRR, No Gallops, Rubs or Murmurs, No Parasternal Heave.  8. Positive Bowel Sounds, Abdomen Soft, No tenderness, No organomegaly appriciated,No rebound -guarding or rigidity.  9.  No Cyanosis, Normal Skin Turgor, No Skin Rash or Bruise.  10. Good muscle tone,  joints appear normal , no  effusions, Normal ROM.  11. No Palpable Lymph Nodes in Neck or Axillae      Data Review:    CBC Recent Labs  Lab 12/27/17 1252 12/27/17 1316  WBC 6.5  --   HGB 12.1 12.6  HCT 36.4 37.0  PLT 180  --   MCV 96.3  --   MCH 32.0  --   MCHC 33.2  --   RDW 13.5  --   LYMPHSABS 2.1  --   MONOABS 0.5  --   EOSABS 0.0  --   BASOSABS 0.0  --    ------------------------------------------------------------------------------------------------------------------  Chemistries  Recent Labs  Lab 12/27/17 1252 12/27/17 1316  NA 135 139  K 4.1 4.2  CL 101 103  CO2 24  --   GLUCOSE 108* 107*  BUN 16 17  CREATININE 1.16* 1.10*  CALCIUM 9.0  --   AST 41  --   ALT 25  --   ALKPHOS 86  --   BILITOT 1.6*  --    ------------------------------------------------------------------------------------------------------------------ estimated creatinine clearance is 38.2 mL/min (A) (by C-G formula based on SCr of 1.1 mg/dL (H)). ------------------------------------------------------------------------------------------------------------------ No results for input(s): TSH, T4TOTAL, T3FREE, THYROIDAB in the last 72 hours.  Invalid input(s): FREET3  Coagulation profile Recent Labs  Lab 12/27/17 1252  INR 1.12   ------------------------------------------------------------------------------------------------------------------- No results for input(s): DDIMER in the last 72 hours. -------------------------------------------------------------------------------------------------------------------  Cardiac Enzymes No results for input(s): CKMB, TROPONINI, MYOGLOBIN in the last 168 hours.  Invalid input(s): CK ------------------------------------------------------------------------------------------------------------------ No results found for: BNP   ---------------------------------------------------------------------------------------------------------------  Urinalysis No results found  for: COLORURINE, APPEARANCEUR, LABSPEC, PHURINE, GLUCOSEU, HGBUR, BILIRUBINUR, KETONESUR, PROTEINUR, UROBILINOGEN, NITRITE, LEUKOCYTESUR  ----------------------------------------------------------------------------------------------------------------   Imaging Results:    Ct Head Wo Contrast  Result Date: 12/27/2017 CLINICAL DATA:  Sudden onset of slurred speech. EXAM: CT HEAD WITHOUT CONTRAST TECHNIQUE: Contiguous axial images were obtained from the base of the skull through the vertex without intravenous contrast. COMPARISON:  None. FINDINGS: Brain: Moderate confluent cortically based infarct in the anterior right insula and frontal operculum. Discrete patchy infarct in the posterior right putamen and along the right frontal parietal cortex. No hemorrhage, hydrocephalus, or masslike finding. Vascular:  No definite hyperdense vessel. Tiny area of high density near the right M1 segment on coronal reformats is not localized or persist on the sagittal reformats. Skull: No acute or aggressive finding Sinuses/Orbits: Negative Other: These results were called by telephone at the time of interpretation on 12/27/2017 at 2:45 pm to Dr. Gareth Morgan , who verbally acknowledged these results. IMPRESSION: Acute right MCA distribution infarct. Moderate confluent infarct centered at the right insula and frontal operculum. Patchy infarct in the right frontal parietal cortex. Negative for hemorrhage. Electronically Signed   By: Monte Fantasia M.D.   On: 12/27/2017 14:53       Assessment & Plan:    Principal Problem:   Stroke [facility]) Active Problems:   Elevated troponin    Acute R MCA stroke MRI/ MRA brain Carotid ultrasound Cardiac echo Check hga1c, lipid NPO til passes RN bedside swallow Start Aspirin 300mg  pr qday Start Lipitor 80mg  po qhs if able to swallow appropriately  PT/OT, speech therapy to evaluate Neurology consulted by ED, appreciate input  Chest pain , dyspnea with exertion Elevated  troponin Tele Trop I q6h x3 Cpk, ck mb Check cardiac echo  Glucose intolerance Check hga1c  DVT Prophylaxis - SCDs  AM Labs Ordered, also please review Full Orders  Family Communication: Admission, patients condition and plan of care including tests being ordered have been discussed with the patient  who indicate understanding and agree with the plan and Code Status.  Code Status FULL CODE  Likely DC to  home  Condition GUARDED    Consults called: neurology by ED,   Admission status: inpatient  Time spent in minutes : 45   Jani Gravel M.D on 12/27/2017 at 3:17 PM  Between 7am to 7pm - Pager - [phone] . After 7pm go to [email or web address] - password [facility]  [facility] - Office  [phone]

## 2017-12-27 NOTE — ED Notes (Signed)
Report called to [facility]

## 2017-12-27 NOTE — ED Triage Notes (Signed)
Reporft called to [facility]

## 2017-12-28 ENCOUNTER — Encounter (HOSPITAL_COMMUNITY): Payer: Self-pay

## 2017-12-28 LAB — COMPREHENSIVE METABOLIC PANEL
ALBUMIN: 3.2 g/dL — AB (ref 3.5–5.0)
ALK PHOS: 80 U/L (ref 38–126)
ALT: 20 U/L (ref 14–54)
AST: 37 U/L (ref 15–41)
Anion gap: 12 (ref 5–15)
BILIRUBIN TOTAL: 2 mg/dL — AB (ref 0.3–1.2)
BUN: 13 mg/dL (ref 6–20)
CALCIUM: 8.7 mg/dL — AB (ref 8.9–10.3)
CO2: 22 mmol/L (ref 22–32)
Chloride: 103 mmol/L (ref 101–111)
Creatinine, Ser: 1.1 mg/dL — ABNORMAL HIGH (ref 0.44–1.00)
GFR calc Af Amer: 55 mL/min — ABNORMAL LOW (ref >60)
GFR calc non Af Amer: 48 mL/min — ABNORMAL LOW (ref >60)
GLUCOSE: 73 mg/dL (ref 65–99)
POTASSIUM: 3.9 mmol/L (ref 3.5–5.1)
Sodium: 137 mmol/L (ref 135–145)
TOTAL PROTEIN: 6.9 g/dL (ref 6.5–8.1)

## 2017-12-28 LAB — CBC
HEMATOCRIT: 37.1 % (ref 36.0–46.0)
Hemoglobin: 12.2 g/dL (ref 12.0–15.0)
MCH: 31.3 pg (ref 26.0–34.0)
MCHC: 32.9 g/dL (ref 30.0–36.0)
MCV: 95.1 fL (ref 78.0–100.0)
Platelets: 159 10*3/uL (ref 150–400)
RBC: 3.9 MIL/uL (ref 3.87–5.11)
RDW: 13.2 % (ref 11.5–15.5)
WBC: 5.3 10*3/uL (ref 4.0–10.5)

## 2017-12-28 LAB — LIPID PANEL
Cholesterol: 170 mg/dL (ref 0–200)
HDL: 35 mg/dL — ABNORMAL LOW (ref >40)
LDL CALC: 120 mg/dL — AB (ref 0–99)
Total CHOL/HDL Ratio: 4.9 RATIO
Triglycerides: 76 mg/dL (ref <150)
VLDL: 15 mg/dL (ref 0–40)

## 2017-12-28 LAB — HEMOGLOBIN A1C
HEMOGLOBIN A1C: 6 % — AB (ref 4.8–5.6)
Mean Plasma Glucose: 125.5 mg/dL

## 2017-12-28 LAB — TROPONIN I: TROPONIN I: 0.12 ng/mL — AB (ref <0.03)

## 2017-12-28 NOTE — Evaluation (Addendum)
Clinical/Bedside Swallow Evaluation Patient Details  Name: Mikayla Reynolds MRN: [id number] Date of Birth: 08/15/1941  Today's Date: 12/28/2017 Time: SLP Start Time (ACUTE ONLY): 0900 SLP Stop Time (ACUTE ONLY): 0910 SLP Time Calculation (min) (ACUTE ONLY): 10 min  Past Medical History:  Past Medical History:  Diagnosis Date  . Pre-diabetes   . TIA (transient ischemic attack)    Past Surgical History:  Past Surgical History:  Procedure Laterality Date  . APPENDECTOMY     HPI:  Mikayla Reynolds is an 77 y.o. [place] female with a past medical history of 2 previous TIAs, hypertension who presents to the ED with a 2-day complaint of left sided hemiparesis, left paresthesia, dysphagia, dysarthria left facial droop and impaired mobility. 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  Assessment / Plan / Recommendation Clinical Impression   Patient presents with mild to moderate left sided facial and lingual weakness, resulting in mild oral dysphagia. Cognitive impairments also contributing; slight left inattention noted with decreased awareness of left anterior spill of thin liquids, purees; pt clears with cues. Weak labial seal resulting in significant anterior spill with cup sips of liquid; straw aids bolus retrieval and containment. Airway protection appears adequate with thin liquids, purees, and regular solids. Pt passes 3 oz water swallow without difficulty. Will initiate dys 2, thin liquids given prolonged mastication with solids and left sided weakness, inattention with potential for L pocketing. No pocketing observed during examination, however pt consumed limited amounts of solids. Will follow up for tolerance, advancement.     SLP Visit Diagnosis: Dysphagia, oral phase (R13.11)    Aspiration Risk  Mild aspiration risk    Diet Recommendation Thin  liquid;Dysphagia 2 (Fine chop)   Liquid Administration via: Cup;Straw Medication Administration: Whole meds with puree Supervision: Staff to assist with self feeding;Intermittent supervision to cue for compensatory strategies Compensations: Slow rate;Small sips/bites;Minimize environmental distractions    Other  Recommendations Oral Care Recommendations: Oral care BID   Follow up Recommendations Other (comment)(tbd)      Frequency and Duration min 2x/week  2 weeks       Prognosis Prognosis for Safe Diet Advancement: Good Barriers to Reach Goals: Cognitive deficits      Swallow Study   General Date of Onset: 12/27/17 HPI: Mikayla Reynolds is an 77 y.o. [place] female with a past medical history of 2 previous TIAs, hypertension who presents to the ED with a 2-day complaint of left sided hemiparesis, left paresthesia, dysphagia, dysarthria left facial droop and impaired mobility. 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 Pt is left-handed. Type of Study: Bedside Swallow Evaluation Previous Swallow Assessment: none on file Diet Prior to this Study: NPO Temperature Spikes Noted: No Respiratory Status: Room air History of Recent Intubation: No Behavior/Cognition: Alert;Cooperative Oral Cavity Assessment: Within Functional Limits Oral Care Completed by SLP: No Oral Cavity - Dentition: Adequate natural dentition Vision: Functional for self-feeding Self-Feeding Abilities: Able to feed self Patient Positioning: Upright in bed Baseline Vocal Quality: Low vocal intensity Volitional Cough: Weak Volitional Swallow: Able to elicit    Oral/Motor/Sensory Function Overall Oral Motor/Sensory Function: Moderate impairment Facial ROM: Reduced left;Suspected CN VII (facial) dysfunction Facial Symmetry: Abnormal symmetry left;Suspected CN VII (facial)  dysfunction Facial Strength: Reduced left;Suspected CN VII (facial) dysfunction Facial Sensation: Within Functional  Limits Lingual ROM: Reduced left;Suspected CN XII (hypoglossal) dysfunction Lingual Strength: Reduced;Suspected CN XII (hypoglossal) dysfunction Velum: Within Functional Limits Mandible: Within Functional Limits   Ice Chips Ice chips: Within functional limits Presentation: Spoon   Thin Liquid Thin Liquid: Impaired Presentation: Cup;Straw Oral Phase Impairments: Reduced labial seal Oral Phase Functional Implications: Left anterior spillage Pharyngeal  Phase Impairments: Other (comments)(non)    Nectar Thick Nectar Thick Liquid: Not tested   Honey Thick Honey Thick Liquid: Not tested   Puree Puree: Impaired Presentation: Self Fed;Spoon Oral Phase Impairments: Reduced labial seal;Poor awareness of bolus Oral Phase Functional Implications: Left anterior spillage Pharyngeal Phase Impairments: (none)   Solid   GO   Solid: Impaired Presentation: Self Fed Oral Phase Impairments: Impaired mastication;Reduced lingual movement/coordination Oral Phase Functional Implications: Prolonged oral transit;Impaired mastication Pharyngeal Phase Impairments: Other (comments)(none)       Deneise Lever, MS, CCC-SLP Speech-Language Pathologist [phone]  Aliene Altes 12/28/2017,9:49 AM

## 2017-12-28 NOTE — Progress Notes (Signed)
DAILY PROGRESS NOTE   Patient Name: Mikayla Reynolds Date of Encounter: 12/28/2017  Chief Complaint   No chest pain  Patient Profile   77 year old [place] woman with history of HTN, TIAs, who presents with MCA infarct. Troponin mildly elevated.  Subjective   Very mildly elevated troponin, with flat trend (0.1, 0.1, 0.13, 0.12)- not suggestive of ACS. 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 LDL cholesterol elevated at 120 (goal <70)- on atorvastatin 40 mg daily. Echo is pending.  Objective   Vitals:   12/27/17 2102 12/27/17 2248 12/28/17 0048 12/28/17 1042  BP:  120/70 (!) 111/57 117/60  Pulse:  (!) 55 (!) 57 69  Resp:   18   Temp:  98.1 F (36.7 C)  98.2 F (36.8 C)  TempSrc:  Oral  Oral  SpO2:  98% 99% 97%  Weight: 119 lb 7.8 oz (54.2 kg)     Height: 5' 3" (1.6 m)      No intake or output data in the 24 hours ending 12/28/17 1047 Filed Weights   12/27/17 1252 12/27/17 2102  Weight: 132 lb 4.4 oz (60 kg) 119 lb 7.8 oz (54.2 kg)    Physical Exam   General appearance: alert and no distress Neck: no carotid bruit, no JVD and thyroid not enlarged, symmetric, no tenderness/mass/nodules Lungs: clear to auscultation bilaterally Heart: regular rate and rhythm Abdomen: soft, non-tender; bowel sounds normal; no masses,  no organomegaly Extremities: extremities normal, atraumatic, no cyanosis or edema Pulses: 2+ and symmetric Skin: Skin color, texture, turgor normal. No rashes or lesions Neurologic: Grossly normal Psych: Pleasant  Inpatient Medications    Scheduled Meds: .  stroke: mapping our early stages of recovery book   Does not apply Once  .  stroke: mapping our early stages of recovery book   Does not apply Once  . aspirin EC  81 mg Oral Daily  . aspirin  300 mg Rectal Daily   Or  . aspirin  325 mg Oral Daily  . atorvastatin  40 mg Oral q1800    Continuous Infusions: . sodium chloride 50 mL/hr (12/27/17 2330)    PRN Meds:    Labs   Results for orders  placed or performed during the hospital encounter of 12/27/17 (from the past 48 hour(s))  Protime-INR     Status: None   Collection Time: 12/27/17 12:52 PM  Result Value Ref Range   Prothrombin Time 14.3 11.4 - 15.2 seconds   INR 1.12     Comment: Performed at [facility] [address], [place], [place] [address]  APTT     Status: None   Collection Time: 12/27/17 12:52 PM  Result Value Ref Range   aPTT 31 24 - 36 seconds    Comment: Performed at [facility] [address] [place], [place] [address]  CBC     Status: Abnormal   Collection Time: 12/27/17 12:52 PM  Result Value Ref Range   WBC 6.5 4.0 - 10.5 K/uL   RBC 3.78 (L) 3.87 - 5.11 MIL/uL   Hemoglobin 12.1 12.0 - 15.0 g/dL   HCT 36.4 36.0 - 46.0 %   MCV 96.3 78.0 - 100.0 fL   MCH 32.0 26.0 - 34.0 pg   MCHC 33.2 30.0 - 36.0 g/dL   RDW 13.5 11.5 - 15.5 %   Platelets 180 150 - 400 K/uL    Comment: Performed at [facility], [place] [address], [place], [place] [address]  Differential     Status: None   Collection Time: 12/27/17 12:52 PM  Result Value Ref Range   Neutrophils Relative % 58 %   Neutro Abs 3.8 1.7 - 7.7 K/uL   Lymphocytes Relative 32 %   Lymphs Abs 2.1 0.7 - 4.0 K/uL   Monocytes Relative 8 %   Monocytes Absolute 0.5 0.1 - 1.0 K/uL   Eosinophils Relative 1 %   Eosinophils Absolute 0.0 0.0 - 0.7 K/uL   Basophils Relative 1 %   Basophils Absolute 0.0 0.0 - 0.1 K/uL    Comment: Performed at [facility] [address] [place], [place] [address]  Comprehensive metabolic panel     Status: Abnormal   Collection Time: 12/27/17 12:52 PM  Result Value Ref Range   Sodium 135 135 - 145 mmol/L   Potassium 4.1 3.5 - 5.1 mmol/L   Chloride 101 101 - 111 mmol/L   CO2 24 22 - 32 mmol/L   Glucose, Bld 108 (H) 65 - 99 mg/dL   BUN 16 6 - 20 mg/dL   Creatinine, Ser 1.16 (H) 0.44 - 1.00 mg/dL   Calcium 9.0 8.9 - 10.3 mg/dL   Total Protein 7.7 6.5 - 8.1 g/dL   Albumin 3.5 3.5 - 5.0 g/dL   AST 41  15 - 41 U/L   ALT 25 14 - 54 U/L   Alkaline Phosphatase 86 38 - 126 U/L   Total Bilirubin 1.6 (H) 0.3 - 1.2 mg/dL   GFR calc non Af Amer 45 (L) >60 mL/min   GFR calc Af Amer 52 (L) >60 mL/min    Comment: (NOTE) The eGFR has been calculated using the CKD EPI equation. This calculation has not been validated in all clinical situations. eGFR's persistently <60 mL/min signify possible Chronic Kidney Disease.    Anion gap 10 5 - 15    Comment: Performed at [facility] [address] [place], [place] [address]  I-stat troponin, ED     Status: Abnormal   Collection Time: 12/27/17  1:15 PM  Result Value Ref Range   Troponin i, poc 0.10 (HH) 0.00 - 0.08 ng/mL   Comment 3            Comment: Due to the release kinetics of cTnI, a negative result within the first hours of the onset of symptoms does not rule out myocardial infarction with certainty. If myocardial infarction is still suspected, repeat the test at appropriate intervals.   I-Stat Chem 8, ED     Status: Abnormal   Collection Time: 12/27/17  1:16 PM  Result Value Ref Range   Sodium 139 135 - 145 mmol/L   Potassium 4.2 3.5 - 5.1 mmol/L   Chloride 103 101 - 111 mmol/L   BUN 17 6 - 20 mg/dL   Creatinine, Ser 1.10 (H) 0.44 - 1.00 mg/dL   Glucose, Bld 107 (H) 65 - 99 mg/dL   Calcium, Ion 1.08 (L) 1.15 - 1.40 mmol/L   TCO2 26 22 - 32 mmol/L   Hemoglobin 12.6 12.0 - 15.0 g/dL   HCT 37.0 36.0 - 46.0 %  Troponin I (q 6hr x 3)     Status: Abnormal   Collection Time: 12/27/17  4:30 PM  Result Value Ref Range   Troponin I 0.10 (HH) <0.03 ng/mL    Comment: CRITICAL RESULT CALLED TO, READ BACK BY AND VERIFIED WITH: Gordan Payment, RN 1735 12/27/2017 THOMPSON V Performed at [facility], [place]  [address], [place], [place] [address]   Troponin I (q 6hr x 3)     Status: Abnormal   Collection Time: 12/27/17  9:39 PM  Result Value Ref Range   Troponin I 0.13 (HH) <0.03 ng/mL    Comment: CRITICAL VALUE NOTED.  VALUE IS  CONSISTENT WITH PREVIOUSLY REPORTED AND CALLED VALUE. Performed at [facility], [place] [address] [place], [place] [address]   Troponin I (q 6hr x 3)     Status: Abnormal   Collection Time: 12/28/17  5:29 AM  Result Value Ref Range   Troponin I 0.12 (HH) <0.03 ng/mL    Comment: CRITICAL VALUE NOTED.  VALUE IS CONSISTENT WITH PREVIOUSLY REPORTED AND CALLED VALUE. Performed at [facility], [place] [address] [place], [place] [address]   Hemoglobin A1c     Status: Abnormal   Collection Time: 12/28/17  5:29 AM  Result Value Ref Range   Hgb A1c MFr Bld 6.0 (H) 4.8 - 5.6 %    Comment: (NOTE) Pre diabetes:          5.7%-6.4% Diabetes:              >6.4% Glycemic control for   <7.0% adults with diabetes    Mean Plasma Glucose 125.5 mg/dL    Comment: Performed at [facility] [address] [place], [place] [address]  Lipid panel     Status: Abnormal   Collection Time: 12/28/17  5:29 AM  Result Value Ref Range   Cholesterol 170 0 - 200 mg/dL   Triglycerides 76 <150 mg/dL   HDL 35 (L) >40 mg/dL   Total CHOL/HDL Ratio 4.9 RATIO   VLDL 15 0 - 40 mg/dL   LDL Cholesterol 120 (H) 0 - 99 mg/dL    Comment:        Total Cholesterol/HDL:CHD Risk Coronary Heart Disease Risk Table                     Men   Women  1/2 Average Risk   3.4   3.3  Average Risk       5.0   4.4  2 X Average Risk   9.6   7.1  3 X Average Risk  23.4   11.0        Use the calculated Patient Ratio above and the CHD Risk Table to determine the patient's CHD Risk.        ATP III CLASSIFICATION (LDL):  <100     mg/dL   Optimal  100-129  mg/dL   Near or Above                    Optimal  130-159  mg/dL   Borderline  160-189  mg/dL   High  >190     mg/dL   Very High Performed at [facility] [address] [place], [place] [address]   Comprehensive metabolic panel     Status: Abnormal   Collection Time: 12/28/17  5:29 AM  Result Value Ref Range   Sodium 137 135 - 145 mmol/L   Potassium 3.9  3.5 - 5.1 mmol/L   Chloride 103 101 - 111 mmol/L   CO2 22 22 - 32 mmol/L   Glucose, Bld 73 65 - 99 mg/dL   BUN 13 6 - 20 mg/dL   Creatinine, Ser 1.10 (H) 0.44 - 1.00 mg/dL   Calcium 8.7 (L) 8.9 - 10.3 mg/dL   Total Protein  6.9 6.5 - 8.1 g/dL   Albumin 3.2 (L) 3.5 - 5.0 g/dL   AST 37 15 - 41 U/L   ALT 20 14 - 54 U/L   Alkaline Phosphatase 80 38 - 126 U/L   Total Bilirubin 2.0 (H) 0.3 - 1.2 mg/dL   GFR calc non Af Amer 48 (L) >60 mL/min   GFR calc Af Amer 55 (L) >60 mL/min    Comment: (NOTE) The eGFR has been calculated using the CKD EPI equation. This calculation has not been validated in all clinical situations. eGFR's persistently <60 mL/min signify possible Chronic Kidney Disease.    Anion gap 12 5 - 15    Comment: Performed at [facility] [address], [place] [address]  CBC     Status: None   Collection Time: 12/28/17  5:29 AM  Result Value Ref Range   WBC 5.3 4.0 - 10.5 K/uL   RBC 3.90 3.87 - 5.11 MIL/uL   Hemoglobin 12.2 12.0 - 15.0 g/dL   HCT 37.1 36.0 - 46.0 %   MCV 95.1 78.0 - 100.0 fL   MCH 31.3 26.0 - 34.0 pg   MCHC 32.9 30.0 - 36.0 g/dL   RDW 13.2 11.5 - 15.5 %   Platelets 159 150 - 400 K/uL    Comment: Performed at [facility] [address] [place], [place] [address]    ECG   N/A  Telemetry   Sinus rhythm - Personally Reviewed  Radiology    Ct Angio Head W Or Wo Contrast  Result Date: 12/27/2017 CLINICAL DATA:  Right MCA distribution stroke. EXAM: CT ANGIOGRAPHY HEAD AND NECK TECHNIQUE: Multidetector CT imaging of the head and neck was performed using the standard protocol during bolus administration of intravenous contrast. Multiplanar CT image reconstructions and MIPs were obtained to evaluate the vascular anatomy. Carotid stenosis measurements (when applicable) are obtained utilizing NASCET criteria, using the distal internal carotid diameter as the denominator. CONTRAST:  15m ISOVUE-370 IOPAMIDOL (ISOVUE-370) INJECTION  76% COMPARISON:  Noncontrast head CT from earlier today FINDINGS: CTA NECK FINDINGS Aortic arch: Atherosclerotic plaque.  Three vessel branching. Right carotid system: Limited atheromatous changes. No stenosis or ulceration. Left carotid system: No noted atheromatous changes. No stenosis or ulceration. Vertebral arteries: No proximal subclavian stenosis. Mild left vertebral artery dominance. The vertebral arteries are smooth and diffusely patent to the dura Skeleton: No acute or aggressive finding Other neck: Negative Upper chest: No acute finding Review of the MIP images confirms the above findings CTA HEAD FINDINGS Anterior circulation: Atherosclerotic plaque on the carotid siphons without flow limiting stenosis. Right distal M1 occlusion. There is prompt reconstitution but the subsequent branches are under filled compared to the left. Posterior circulation: Vertebrobasilar arteries are smooth and diffusely patent. Robust flow in bilateral posterior cerebral arteries. Negative for aneurysm Venous sinuses: Negative Anatomic variants: None significant Delayed phase: No abnormal intracranial enhancement These results were called by telephone at the time of interpretation on 12/27/2017 at 6:06 pm to Dr. PJacob Moores, who verbally acknowledged these results. Review of the MIP images confirms the above findings IMPRESSION: 1. Right distal M1 occlusion. Right M2 and M3 branch reconstitution with underfilling compared to the left. 2. No embolic source identified. Mild atheromatous changes without atheromatous stenosis. Electronically Signed   By: JMonte FantasiaM.D.   On: 12/27/2017 18:07   Dg Chest 2 View  Result Date: 12/27/2017 CLINICAL DATA:  77y.o. [place] with a past medical history of  2 previous TIAs, hypertension who presents to the ED with a 2-day complaint of left sided hemiparesis, left paresthesia, dysphagia, dysarthria left facial droop and impaired mobility EXAM: CHEST - 2 VIEW COMPARISON:   None. FINDINGS: Cardiac silhouette is mildly enlarged. No mediastinal or hilar masses. No evidence of adenopathy. Mild prominence of the bronchovascular markings diffusely. Lungs are clear. No pleural effusion or pneumothorax. Skeletal structures are demineralized but intact. IMPRESSION: No acute cardiopulmonary disease.  Mild cardiomegaly. Electronically Signed   By: Lajean Manes M.D.   On: 12/27/2017 18:44   Ct Head Wo Contrast  Result Date: 12/27/2017 CLINICAL DATA:  Sudden onset of slurred speech. EXAM: CT HEAD WITHOUT CONTRAST TECHNIQUE: Contiguous axial images were obtained from the base of the skull through the vertex without intravenous contrast. COMPARISON:  None. FINDINGS: Brain: Moderate confluent cortically based infarct in the anterior right insula and frontal operculum. Discrete patchy infarct in the posterior right putamen and along the right frontal parietal cortex. No hemorrhage, hydrocephalus, or masslike finding. Vascular: No definite hyperdense vessel. Tiny area of high density near the right M1 segment on coronal reformats is not localized or persist on the sagittal reformats. Skull: No acute or aggressive finding Sinuses/Orbits: Negative Other: These results were called by telephone at the time of interpretation on 12/27/2017 at 2:45 pm to Dr. Gareth Morgan , who verbally acknowledged these results. IMPRESSION: Acute right MCA distribution infarct. Moderate confluent infarct centered at the right insula and frontal operculum. Patchy infarct in the right frontal parietal cortex. Negative for hemorrhage. Electronically Signed   By: Monte Fantasia M.D.   On: 12/27/2017 14:53   Ct Angio Neck W Or Wo Contrast  Result Date: 12/27/2017 CLINICAL DATA:  Right MCA distribution stroke. EXAM: CT ANGIOGRAPHY HEAD AND NECK TECHNIQUE: Multidetector CT imaging of the head and neck was performed using the standard protocol during bolus administration of intravenous contrast. Multiplanar CT image  reconstructions and MIPs were obtained to evaluate the vascular anatomy. Carotid stenosis measurements (when applicable) are obtained utilizing NASCET criteria, using the distal internal carotid diameter as the denominator. CONTRAST:  57m ISOVUE-370 IOPAMIDOL (ISOVUE-370) INJECTION 76% COMPARISON:  Noncontrast head CT from earlier today FINDINGS: CTA NECK FINDINGS Aortic arch: Atherosclerotic plaque.  Three vessel branching. Right carotid system: Limited atheromatous changes. No stenosis or ulceration. Left carotid system: No noted atheromatous changes. No stenosis or ulceration. Vertebral arteries: No proximal subclavian stenosis. Mild left vertebral artery dominance. The vertebral arteries are smooth and diffusely patent to the dura Skeleton: No acute or aggressive finding Other neck: Negative Upper chest: No acute finding Review of the MIP images confirms the above findings CTA HEAD FINDINGS Anterior circulation: Atherosclerotic plaque on the carotid siphons without flow limiting stenosis. Right distal M1 occlusion. There is prompt reconstitution but the subsequent branches are under filled compared to the left. Posterior circulation: Vertebrobasilar arteries are smooth and diffusely patent. Robust flow in bilateral posterior cerebral arteries. Negative for aneurysm Venous sinuses: Negative Anatomic variants: None significant Delayed phase: No abnormal intracranial enhancement These results were called by telephone at the time of interpretation on 12/27/2017 at 6:06 pm to Dr. PJacob Moores, who verbally acknowledged these results. Review of the MIP images confirms the above findings IMPRESSION: 1. Right distal M1 occlusion. Right M2 and M3 branch reconstitution with underfilling compared to the left. 2. No embolic source identified. Mild atheromatous changes without atheromatous stenosis. Electronically Signed   By: JMonte FantasiaM.D.   On: 12/27/2017 18:07    Cardiac  Studies   Echo pending  Assessment    1. Principal Problem: 2.   Stroke ([place]) 3. Active Problems: 4.   Elevated troponin 5.   Chest pain 6.   Essential hypertension 7.   Plan   1. Doubt ACS - troponin may be related to CKD. Plan echo today. On appropriate medical therapy for stroke. May ultimately need outpatient stress testing.  Time Spent Directly with Patient:  I have spent a total of 15 minutes with the patient reviewing hospital notes, telemetry, EKGs, labs and examining the patient as well as establishing an assessment and plan that was discussed personally with the patient. > 50% of time was spent in direct patient care.  Length of Stay:  LOS: 1 day   Pixie Casino, MD, [facility], [place] Director of the Advanced Lipid Disorders &  Cardiovascular Risk Reduction Clinic Diplomate of the American Board of Clinical Lipidology Attending Cardiologist  Direct Dial: [phone]  Fax: [phone]  Website:  [email or web address] 12/28/2017, 10:47 AM

## 2017-12-28 NOTE — Progress Notes (Signed)
Subjective: Left-sided weakness.  Dysphagia seems to be improving.  Objective: Current vital signs: BP 117/60 (BP Location: Left Arm)   Pulse 69   Temp 98.2 F (36.8 C) (Oral)   Resp 18   Ht 5\' 3"  (1.6 m)   Wt 54.2 kg (119 lb 7.8 oz)   SpO2 97%   BMI 21.17 kg/m  Vital signs in last 24 hours: Temp:  [97.8 F (36.6 C)-98.2 F (36.8 C)] 98.2 F (36.8 C) (04/28 1042) Pulse Rate:  [41-86] 69 (04/28 1042) Resp:  [12-22] 18 (04/28 0048) BP: (105-126)/(54-88) 117/60 (04/28 1042) SpO2:  [86 %-100 %] 97 % (04/28 1042) Weight:  [54.2 kg (119 lb 7.8 oz)-60 kg (132 lb 4.4 oz)] 54.2 kg (119 lb 7.8 oz) (04/27 2102)  Intake/Output from previous day: No intake/output data recorded. Intake/Output this shift: No intake/output data recorded. Nutritional status: Fall precautions Aspiration precautions Fall precautions DIET DYS 2 Room service appropriate? Yes; Fluid consistency: Thin  Neurological exam alert  Pupils are equal and reactive light accommodation, extraocular movement intact, left facial droop upon smiling sensation of the face intact strong shoulder shrug tongue in midline Motor exam left upper extremity 4/5 with mild drift and dysmetria Left lower extremity 4++/5 Right side is within normal limits. Finger-to-nose on left is intact, on the right mild dysmetria Speech mild dysarthria   Lab Results: Basic Metabolic Panel: Recent Labs  Lab 12/27/17 1252 12/27/17 1316 12/28/17 0529  NA 135 139 137  K 4.1 4.2 3.9  CL 101 103 103  CO2 24  --  22  GLUCOSE 108* 107* 73  BUN 16 17 13   CREATININE 1.16* 1.10* 1.10*  CALCIUM 9.0  --  8.7*    Liver Function Tests: Recent Labs  Lab 12/27/17 1252 12/28/17 0529  AST 41 37  ALT 25 20  ALKPHOS 86 80  BILITOT 1.6* 2.0*  PROT 7.7 6.9  ALBUMIN 3.5 3.2*   No results for input(s): AMMONIA in the last 168 hours.  CBC: Recent Labs  Lab 12/27/17 1252 12/27/17 1316 12/28/17 0529  WBC 6.5  --  5.3  NEUTROABS 3.8  --   --    HGB 12.1 12.6 12.2  HCT 36.4 37.0 37.1  MCV 96.3  --  95.1  PLT 180  --  159      Lipid Panel: Recent Labs  Lab 12/28/17 0529  CHOL 170  TRIG 76  HDL 35*  CHOLHDL 4.9  VLDL 15  LDLCALC 120*    CBG: No results for input(s): GLUCAP in the last 168 hours.    Imaging: Ct Angio Head W Or Wo Contrast  Result Date: 12/27/2017 CLINICAL DATA:  Right MCA distribution stroke. EXAM: CT ANGIOGRAPHY HEAD AND NECK TECHNIQUE: Multidetector CT imaging of the head and neck was performed using the standard protocol during bolus administration of intravenous contrast. Multiplanar CT image reconstructions and MIPs were obtained to evaluate the vascular anatomy. Carotid stenosis measurements (when applicable) are obtained utilizing NASCET criteria, using the distal internal carotid diameter as the denominator. CONTRAST:  19mL ISOVUE-370 IOPAMIDOL (ISOVUE-370) INJECTION 76% COMPARISON:  Noncontrast head CT from earlier today FINDINGS: CTA NECK FINDINGS Aortic arch: Atherosclerotic plaque.  Three vessel branching. Right carotid system: Limited atheromatous changes. No stenosis or ulceration. Left carotid system: No noted atheromatous changes. No stenosis or ulceration. Vertebral arteries: No proximal subclavian stenosis. Mild left vertebral artery dominance. The vertebral arteries are smooth and diffusely patent to the dura Skeleton: No acute or aggressive finding Other neck: Negative  Upper chest: No acute finding Review of the MIP images confirms the above findings CTA HEAD FINDINGS Anterior circulation: Atherosclerotic plaque on the carotid siphons without flow limiting stenosis. Right distal M1 occlusion. There is prompt reconstitution but the subsequent branches are under filled compared to the left. Posterior circulation: Vertebrobasilar arteries are smooth and diffusely patent. Robust flow in bilateral posterior cerebral arteries. Negative for aneurysm Venous sinuses: Negative Anatomic variants: None  significant Delayed phase: No abnormal intracranial enhancement These results were called by telephone at the time of interpretation on 12/27/2017 at 6:06 pm to Dr. Jacob Moores , who verbally acknowledged these results. Review of the MIP images confirms the above findings IMPRESSION: 1. Right distal M1 occlusion. Right M2 and M3 branch reconstitution with underfilling compared to the left. 2. No embolic source identified. Mild atheromatous changes without atheromatous stenosis. Electronically Signed   By: Monte Fantasia M.D.   On: 12/27/2017 18:07   Dg Chest 2 View  Result Date: 12/27/2017 CLINICAL DATA:  77 y.o. [place] female with a past medical history of 2 previous TIAs, hypertension who presents to the ED with a 2-day complaint of left sided hemiparesis, left paresthesia, dysphagia, dysarthria left facial droop and impaired mobility EXAM: CHEST - 2 VIEW COMPARISON:  None. FINDINGS: Cardiac silhouette is mildly enlarged. No mediastinal or hilar masses. No evidence of adenopathy. Mild prominence of the bronchovascular markings diffusely. Lungs are clear. No pleural effusion or pneumothorax. Skeletal structures are demineralized but intact. IMPRESSION: No acute cardiopulmonary disease.  Mild cardiomegaly. Electronically Signed   By: Lajean Manes M.D.   On: 12/27/2017 18:44   Ct Head Wo Contrast  Result Date: 12/27/2017 CLINICAL DATA:  Sudden onset of slurred speech. EXAM: CT HEAD WITHOUT CONTRAST TECHNIQUE: Contiguous axial images were obtained from the base of the skull through the vertex without intravenous contrast. COMPARISON:  None. FINDINGS: Brain: Moderate confluent cortically based infarct in the anterior right insula and frontal operculum. Discrete patchy infarct in the posterior right putamen and along the right frontal parietal cortex. No hemorrhage, hydrocephalus, or masslike finding. Vascular: No definite hyperdense vessel. Tiny area of high density near the right M1 segment on coronal  reformats is not localized or persist on the sagittal reformats. Skull: No acute or aggressive finding Sinuses/Orbits: Negative Other: These results were called by telephone at the time of interpretation on 12/27/2017 at 2:45 pm to Dr. Gareth Morgan , who verbally acknowledged these results. IMPRESSION: Acute right MCA distribution infarct. Moderate confluent infarct centered at the right insula and frontal operculum. Patchy infarct in the right frontal parietal cortex. Negative for hemorrhage. Electronically Signed   By: Monte Fantasia M.D.   On: 12/27/2017 14:53   Ct Angio Neck W Or Wo Contrast  Result Date: 12/27/2017 CLINICAL DATA:  Right MCA distribution stroke. EXAM: CT ANGIOGRAPHY HEAD AND NECK TECHNIQUE: Multidetector CT imaging of the head and neck was performed using the standard protocol during bolus administration of intravenous contrast. Multiplanar CT image reconstructions and MIPs were obtained to evaluate the vascular anatomy. Carotid stenosis measurements (when applicable) are obtained utilizing NASCET criteria, using the distal internal carotid diameter as the denominator. CONTRAST:  53mL ISOVUE-370 IOPAMIDOL (ISOVUE-370) INJECTION 76% COMPARISON:  Noncontrast head CT from earlier today FINDINGS: CTA NECK FINDINGS Aortic arch: Atherosclerotic plaque.  Three vessel branching. Right carotid system: Limited atheromatous changes. No stenosis or ulceration. Left carotid system: No noted atheromatous changes. No stenosis or ulceration. Vertebral arteries: No proximal subclavian stenosis. Mild left vertebral artery dominance. The  vertebral arteries are smooth and diffusely patent to the dura Skeleton: No acute or aggressive finding Other neck: Negative Upper chest: No acute finding Review of the MIP images confirms the above findings CTA HEAD FINDINGS Anterior circulation: Atherosclerotic plaque on the carotid siphons without flow limiting stenosis. Right distal M1 occlusion. There is prompt  reconstitution but the subsequent branches are under filled compared to the left. Posterior circulation: Vertebrobasilar arteries are smooth and diffusely patent. Robust flow in bilateral posterior cerebral arteries. Negative for aneurysm Venous sinuses: Negative Anatomic variants: None significant Delayed phase: No abnormal intracranial enhancement These results were called by telephone at the time of interpretation on 12/27/2017 at 6:06 pm to Dr. Jacob Moores , who verbally acknowledged these results. Review of the MIP images confirms the above findings IMPRESSION: 1. Right distal M1 occlusion. Right M2 and M3 branch reconstitution with underfilling compared to the left. 2. No embolic source identified. Mild atheromatous changes without atheromatous stenosis. Electronically Signed   By: Monte Fantasia M.D.   On: 12/27/2017 18:07    Medications:  Scheduled: .  stroke: mapping our early stages of recovery book   Does not apply Once  .  stroke: mapping our early stages of recovery book   Does not apply Once  . aspirin EC  81 mg Oral Daily  . aspirin  300 mg Rectal Daily   Or  . aspirin  325 mg Oral Daily  . atorvastatin  40 mg Oral q1800    Assessment/Plan:  77 year old female  [place] presented with the left-sided weakness facial droop and dysphagia.  Symptoms started 24 hours prior to presentation so she was not a candidate for intervention or IV TPA. Work-up includes CT scan of the head showed right MCA stroke, CTA head and neck showed right distal M1 occlusion.  EKG showed sinus rhythm, LDL 120, hemoglobin A1c 6.0. Echocardiogram results are pending. Continue symptomatic pretreatment. PT OT and speech therapy. Aspirin and statin therapy for long-term stroke prevention.         Etta Quill PA-C [facility] [phone]  12/28/2017, 11:22 AM

## 2017-12-28 NOTE — Progress Notes (Signed)
PROGRESS NOTE    Mikayla Reynolds  IRS:[id number] DOB: Oct 20, 1940 DOA: 12/27/2017 PCP: Patient, No Pcp Per   Brief Narrative: Patient is a 77 female , [place] speaking , with past medical history of TIA, hypertension who presented to the emergency department with complaints of  left-sided weakness along with dysphagia and left facial droop for last 2 days.  Patient not  able to swallow the food.  Patient also complained of intermittent chest pain for last 1 month and dyspnea on exertion.  CT of the brain showed right MCA CVA.  Troponin mildly elevated. Cardiology consulted.  Assessment & Plan:   Principal Problem:   Stroke [facility]) Active Problems:   Elevated troponin   Chest pain   Essential hypertension   Acute ischemic stroke: CT of the brain showed right MCA territory CVA. CT head /neck showed right distal M1 occlusion.  Neurology following.  PT/OT/speech evaluation. Speech recommended dysphagia 2 diet. LDL is 120. Hemoglobin A1c of 6. Continue aspirin, Lipitor. She has left-sided weakness more on the upper extremity. We will follow-up echocardiogram.  Chest pain/dyspnea with exertion: Cardiology following.  Mildly elevated troponin.  Did not trend upward.  Currently denies any chest pain. We will follow-up echocardiogram. Cardiology recommending outpatient stress testing.  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  No previous reports available.    DVT prophylaxis: SCD Code Status: Full Family Communication: Son present at the bedside Disposition Plan: Pending PT/OT evaluation   Consultants: Neurology, cardiology  Procedures: None  Antimicrobials: None  Subjective: Patient seen and examined at bedside this morning.  He was comfortable.  Still has left-sided weakness.  Currently alert and oriented.  Objective: Vitals:   12/27/17 2102 12/27/17 2248 12/28/17 0048 12/28/17 1042  BP:  120/70 (!) 111/57 117/60  Pulse:  (!) 55 (!) 57 69  Resp:   18   Temp:  98.1 F  (36.7 C)  98.2 F (36.8 C)  TempSrc:  Oral  Oral  SpO2:  98% 99% 97%  Weight: 54.2 kg (119 lb 7.8 oz)     Height: 5\' 3"  (1.6 m)      No intake or output data in the 24 hours ending 12/28/17 1103 Filed Weights   12/27/17 1252 12/27/17 2102  Weight: 60 kg (132 lb 4.4 oz) 54.2 kg (119 lb 7.8 oz)    Examination:  General exam: Appears calm and comfortable ,Not in distress,average built HEENT:PERRL,Oral mucosa moist, Ear/Nose normal on gross exam, left facial droop Respiratory system: Bilateral equal air entry, normal vesicular breath sounds, no wheezes or crackles  Cardiovascular system: S1 & S2 heard, RRR. No JVD, murmurs, rubs, gallops or clicks. No pedal edema. Gastrointestinal system: Abdomen is nondistended, soft and nontender. No organomegaly or masses felt. Normal bowel sounds heard. Central nervous system: Alert and oriented.  Left-sided weakness.  Power 4/5 on the left upper extremity,4+/5 on the left lower extremity,5/5 on the right side Extremities: No edema, no clubbing ,no cyanosis, distal peripheral pulses palpable. Skin: No rashes, lesions or ulcers,no icterus ,no pallor MSK: Normal muscle bulk,tone ,power Psychiatry: Judgement and insight appear normal. Mood & affect appropriate.     Data Reviewed: I have personally reviewed following labs and imaging studies  CBC: Recent Labs  Lab 12/27/17 1252 12/27/17 1316 12/28/17 0529  WBC 6.5  --  5.3  NEUTROABS 3.8  --   --   HGB 12.1 12.6 12.2  HCT 36.4 37.0 37.1  MCV 96.3  --  95.1  PLT 180  --  902   Basic Metabolic Panel: Recent Labs  Lab 12/27/17 1252 12/27/17 1316 12/28/17 0529  NA 135 139 137  K 4.1 4.2 3.9  CL 101 103 103  CO2 24  --  22  GLUCOSE 108* 107* 73  BUN 16 17 13   CREATININE 1.16* 1.10* 1.10*  CALCIUM 9.0  --  8.7*   GFR: Estimated Creatinine Clearance: 36.6 mL/min (A) (by C-G formula based on SCr of 1.1 mg/dL (H)). Liver Function Tests: Recent Labs  Lab 12/27/17 1252 12/28/17 0529    AST 41 37  ALT 25 20  ALKPHOS 86 80  BILITOT 1.6* 2.0*  PROT 7.7 6.9  ALBUMIN 3.5 3.2*   No results for input(s): LIPASE, AMYLASE in the last 168 hours. No results for input(s): AMMONIA in the last 168 hours. Coagulation Profile: Recent Labs  Lab 12/27/17 1252  INR 1.12   Cardiac Enzymes: Recent Labs  Lab 12/27/17 1630 12/27/17 2139 12/28/17 0529  TROPONINI 0.10* 0.13* 0.12*   BNP (last 3 results) No results for input(s): PROBNP in the last 8760 hours. HbA1C: Recent Labs    12/28/17 0529  HGBA1C 6.0*   CBG: No results for input(s): GLUCAP in the last 168 hours. Lipid Profile: Recent Labs    12/28/17 0529  CHOL 170  HDL 35*  LDLCALC 120*  TRIG 76  CHOLHDL 4.9   Thyroid Function Tests: No results for input(s): TSH, T4TOTAL, FREET4, T3FREE, THYROIDAB in the last 72 hours. Anemia Panel: No results for input(s): VITAMINB12, FOLATE, FERRITIN, TIBC, IRON, RETICCTPCT in the last 72 hours. Sepsis Labs: No results for input(s): PROCALCITON, LATICACIDVEN in the last 168 hours.  No results found for this or any previous visit (from the past 240 hour(s)).       Radiology Studies: Ct Angio Head W Or Wo Contrast  Result Date: 12/27/2017 CLINICAL DATA:  Right MCA distribution stroke. EXAM: CT ANGIOGRAPHY HEAD AND NECK TECHNIQUE: Multidetector CT imaging of the head and neck was performed using the standard protocol during bolus administration of intravenous contrast. Multiplanar CT image reconstructions and MIPs were obtained to evaluate the vascular anatomy. Carotid stenosis measurements (when applicable) are obtained utilizing NASCET criteria, using the distal internal carotid diameter as the denominator. CONTRAST:  66mL ISOVUE-370 IOPAMIDOL (ISOVUE-370) INJECTION 76% COMPARISON:  Noncontrast head CT from earlier today FINDINGS: CTA NECK FINDINGS Aortic arch: Atherosclerotic plaque.  Three vessel branching. Right carotid system: Limited atheromatous changes. No stenosis  or ulceration. Left carotid system: No noted atheromatous changes. No stenosis or ulceration. Vertebral arteries: No proximal subclavian stenosis. Mild left vertebral artery dominance. The vertebral arteries are smooth and diffusely patent to the dura Skeleton: No acute or aggressive finding Other neck: Negative Upper chest: No acute finding Review of the MIP images confirms the above findings CTA HEAD FINDINGS Anterior circulation: Atherosclerotic plaque on the carotid siphons without flow limiting stenosis. Right distal M1 occlusion. There is prompt reconstitution but the subsequent branches are under filled compared to the left. Posterior circulation: Vertebrobasilar arteries are smooth and diffusely patent. Robust flow in bilateral posterior cerebral arteries. Negative for aneurysm Venous sinuses: Negative Anatomic variants: None significant Delayed phase: No abnormal intracranial enhancement These results were called by telephone at the time of interpretation on 12/27/2017 at 6:06 pm to Dr. Jacob Moores , who verbally acknowledged these results. Review of the MIP images confirms the above findings IMPRESSION: 1. Right distal M1 occlusion. Right M2 and M3 branch reconstitution with underfilling compared to the left. 2. No embolic source  identified. Mild atheromatous changes without atheromatous stenosis. Electronically Signed   By: Monte Fantasia M.D.   On: 12/27/2017 18:07   Dg Chest 2 View  Result Date: 12/27/2017 CLINICAL DATA:  77 y.o. [place] female with a past medical history of 2 previous TIAs, hypertension who presents to the ED with a 2-day complaint of left sided hemiparesis, left paresthesia, dysphagia, dysarthria left facial droop and impaired mobility EXAM: CHEST - 2 VIEW COMPARISON:  None. FINDINGS: Cardiac silhouette is mildly enlarged. No mediastinal or hilar masses. No evidence of adenopathy. Mild prominence of the bronchovascular markings diffusely. Lungs are clear. No pleural effusion  or pneumothorax. Skeletal structures are demineralized but intact. IMPRESSION: No acute cardiopulmonary disease.  Mild cardiomegaly. Electronically Signed   By: Lajean Manes M.D.   On: 12/27/2017 18:44   Ct Head Wo Contrast  Result Date: 12/27/2017 CLINICAL DATA:  Sudden onset of slurred speech. EXAM: CT HEAD WITHOUT CONTRAST TECHNIQUE: Contiguous axial images were obtained from the base of the skull through the vertex without intravenous contrast. COMPARISON:  None. FINDINGS: Brain: Moderate confluent cortically based infarct in the anterior right insula and frontal operculum. Discrete patchy infarct in the posterior right putamen and along the right frontal parietal cortex. No hemorrhage, hydrocephalus, or masslike finding. Vascular: No definite hyperdense vessel. Tiny area of high density near the right M1 segment on coronal reformats is not localized or persist on the sagittal reformats. Skull: No acute or aggressive finding Sinuses/Orbits: Negative Other: These results were called by telephone at the time of interpretation on 12/27/2017 at 2:45 pm to Dr. Gareth Morgan , who verbally acknowledged these results. IMPRESSION: Acute right MCA distribution infarct. Moderate confluent infarct centered at the right insula and frontal operculum. Patchy infarct in the right frontal parietal cortex. Negative for hemorrhage. Electronically Signed   By: Monte Fantasia M.D.   On: 12/27/2017 14:53   Ct Angio Neck W Or Wo Contrast  Result Date: 12/27/2017 CLINICAL DATA:  Right MCA distribution stroke. EXAM: CT ANGIOGRAPHY HEAD AND NECK TECHNIQUE: Multidetector CT imaging of the head and neck was performed using the standard protocol during bolus administration of intravenous contrast. Multiplanar CT image reconstructions and MIPs were obtained to evaluate the vascular anatomy. Carotid stenosis measurements (when applicable) are obtained utilizing NASCET criteria, using the distal internal carotid diameter as the  denominator. CONTRAST:  79mL ISOVUE-370 IOPAMIDOL (ISOVUE-370) INJECTION 76% COMPARISON:  Noncontrast head CT from earlier today FINDINGS: CTA NECK FINDINGS Aortic arch: Atherosclerotic plaque.  Three vessel branching. Right carotid system: Limited atheromatous changes. No stenosis or ulceration. Left carotid system: No noted atheromatous changes. No stenosis or ulceration. Vertebral arteries: No proximal subclavian stenosis. Mild left vertebral artery dominance. The vertebral arteries are smooth and diffusely patent to the dura Skeleton: No acute or aggressive finding Other neck: Negative Upper chest: No acute finding Review of the MIP images confirms the above findings CTA HEAD FINDINGS Anterior circulation: Atherosclerotic plaque on the carotid siphons without flow limiting stenosis. Right distal M1 occlusion. There is prompt reconstitution but the subsequent branches are under filled compared to the left. Posterior circulation: Vertebrobasilar arteries are smooth and diffusely patent. Robust flow in bilateral posterior cerebral arteries. Negative for aneurysm Venous sinuses: Negative Anatomic variants: None significant Delayed phase: No abnormal intracranial enhancement These results were called by telephone at the time of interpretation on 12/27/2017 at 6:06 pm to Dr. Jacob Moores , who verbally acknowledged these results. Review of the MIP images confirms the above findings IMPRESSION: 1. Right  distal M1 occlusion. Right M2 and M3 branch reconstitution with underfilling compared to the left. 2. No embolic source identified. Mild atheromatous changes without atheromatous stenosis. Electronically Signed   By: Monte Fantasia M.D.   On: 12/27/2017 18:07        Scheduled Meds: .  stroke: mapping our early stages of recovery book   Does not apply Once  .  stroke: mapping our early stages of recovery book   Does not apply Once  . aspirin EC  81 mg Oral Daily  . aspirin  300 mg Rectal Daily   Or  .  aspirin  325 mg Oral Daily  . atorvastatin  40 mg Oral q1800   Continuous Infusions: . sodium chloride 50 mL/hr (12/27/17 2330)     LOS: 1 day    Time spent: 35 mins.More than 50% of that time was spent in counseling and/or coordination of care.      Shelly Coss, MD [facility] Pager [phone]  If 7PM-7AM, please contact night-coverage [email or web address] Password [facility] 12/28/2017, 11:03 AM

## 2017-12-28 NOTE — Progress Notes (Signed)
VASCULAR LAB    Patient had normal CTA of neck. Please advise if carotid duplex still needed.  Thank you.      Kenly Xiao, RVT 12/28/2017, 7:29 AM

## 2017-12-28 NOTE — Evaluation (Signed)
Physical Therapy Evaluation Patient Details Name: Mikayla Reynolds MRN: [id number] DOB: 1941/06/06 Today's Date: 12/28/2017   History of Present Illness  Pt admitted with R MCA stroke and elevated troponins. PMH significant for 2 prior TIAs and HTN.   Clinical Impression  Pt admitted with above diagnosis. Pt currently with functional limitations due to the deficits listed below (see PT Problem List).  Pt will benefit from skilled PT to increase their independence and safety with mobility to allow discharge to home with 24 hour care from her family. Recommend HHPT f/u.      Follow Up Recommendations Home health PT;Supervision/Assistance - 24 hour    Equipment Recommendations  None recommended by PT    Recommendations for Other Services OT consult;Speech consult     Precautions / Restrictions Precautions Precautions: Fall Restrictions Weight Bearing Restrictions: No      Mobility  Bed Mobility Overal bed mobility: Needs Assistance Bed Mobility: Supine to Sit;Sit to Supine     Supine to sit: Supervision Sit to supine: Min assist      Transfers Overall transfer level: Needs assistance Equipment used: None Transfers: Sit to/from Stand Sit to Stand: Min guard            Ambulation/Gait Ambulation/Gait assistance: Min assist Ambulation Distance (Feet): 80 Feet Assistive device: Rolling walker (2 wheeled) Gait Pattern/deviations: Step-to pattern;Decreased stride length     General Gait Details: pt had difficulty maintaining grip with left hand, therefore tended to run walker into things on the right  Stairs            Wheelchair Mobility    Modified Rankin (Stroke Patients Only) Modified Rankin (Stroke Patients Only) Pre-Morbid Rankin Score: No symptoms Modified Rankin: Moderately severe disability     Balance Overall balance assessment: Needs assistance   Sitting balance-Leahy Scale: Fair       Standing balance-Leahy Scale: Poor                                Pertinent Vitals/Pain Pain Assessment: No/denies pain    Home Living Family/patient expects to be discharged to:: Private residence Living Arrangements: Children Available Help at Discharge: Family;Available 24 hours/day Type of Home: House Home Access: Stairs to enter   [employer] of Steps: 3 Home Layout: One level Home Equipment: Cane - single point      Prior Function Level of Independence: Independent(Used cane when ambulating long distances, such as at an air )               Hand Dominance   Dominant Hand: Right(per charting pt left handed, pt reports R dominant)    Extremity/Trunk Assessment   Upper Extremity Assessment Upper Extremity Assessment: Defer to OT evaluation(LUE weakness and difficluty with coordination  noted)    Lower Extremity Assessment Lower Extremity Assessment: LLE deficits/detail LLE Deficits / Details: decreased functional strength and coordination LLE Coordination: decreased gross motor    Cervical / Trunk Assessment Cervical / Trunk Assessment: Normal  Communication   Communication: Prefers language other than [place];Interpreter utilized(Son preferred to interpret and pt was in agreement with this)  Cognition Arousal/Alertness: Awake/alert Behavior During Therapy: Flat affect Overall Cognitive Status: Impaired/Different from baseline                                        General Comments General comments (skin  integrity, edema, etc.): son present throughout session. Pt demonstrates left side inattention    Exercises     Assessment/Plan    PT Assessment Patient needs continued PT services  PT Problem List Decreased strength;Decreased balance;Decreased mobility;Decreased coordination;Decreased knowledge of use of DME;Decreased safety awareness       PT Treatment Interventions DME instruction;Gait training;Stair training;Therapeutic activities;Balance  training;Neuromuscular re-education;Patient/family education    PT Goals (Current goals can be found in the Care Plan section)  Acute Rehab PT Goals Patient Stated Goal: pt did not state a goal PT Goal Formulation: With patient/family Time For Goal Achievement: 01/04/18 Potential to Achieve Goals: Good    Frequency Min 4X/week   Barriers to discharge        Co-evaluation               AM-PAC PT "6 Clicks" Daily Activity  Outcome Measure Difficulty turning over in bed (including adjusting bedclothes, sheets and blankets)?: A Little Difficulty moving from lying on back to sitting on the side of the bed? : A Little Difficulty sitting down on and standing up from a chair with arms (e.g., wheelchair, bedside commode, etc,.)?: A Little Help needed moving to and from a bed to chair (including a wheelchair)?: A Little Help needed walking in hospital room?: A Little Help needed climbing 3-5 steps with a railing? : A Lot 6 Click Score: 17    End of Session Equipment Utilized During Treatment: Gait belt Activity Tolerance: Patient tolerated treatment well Patient left: in bed;with call bell/phone within reach;with bed alarm set;with family/visitor present   PT Visit Diagnosis: Unsteadiness on feet (R26.81)    Time: 9767-3419 PT Time Calculation (min) (ACUTE ONLY): 25 min   Charges:   PT Evaluation $PT Eval Moderate Complexity: 1 Mod PT Treatments $Gait Training: 8-22 mins   PT G CodesLavonia Reynolds, PT  [phone] 12/28/2017   Mikayla Reynolds 12/28/2017, 11:32 AM

## 2017-12-28 NOTE — Evaluation (Signed)
Occupational Therapy Evaluation Patient Details Name: Mikayla Reynolds MRN: [id number] DOB: 24-Jun-1941 Today's Date: 12/28/2017    History of Present Illness Pt admitted with R MCA stroke and elevated troponins. PMH significant for 2 prior TIAs and HTN.    Clinical Impression   PTA, pt was independent with ADL and functional mobility per her report. She speaks only [place] and thus video interpreting system used for communication. Pt able to follow simple commands this session but did demonstrate difficulty sequencing movements during grooming and toileting hygiene tasks. Pt additionally presents with decreased strength and coordination in L UE as well as poor attention to L side of body and visual field. She also demonstrated difficulty sustaining eye position to the L of midline today. As a result, pt requires overall min assist for ADL participation. Pt would benefit from continued OT services while admitted to improve independence with ADL and functional mobility. Recommend home health OT follow-up as well as 24 hour assistance from family.     Follow Up Recommendations  Home health OT;Supervision/Assistance - 24 hour    Equipment Recommendations  3 in 1 bedside commode    Recommendations for Other Services       Precautions / Restrictions Precautions Precautions: Fall Restrictions Weight Bearing Restrictions: No      Mobility Bed Mobility Overal bed mobility: Needs Assistance Bed Mobility: Supine to Sit;Sit to Supine     Supine to sit: Supervision Sit to supine: Supervision   General bed mobility comments: Supervision for safety.   Transfers Overall transfer level: Needs assistance Equipment used: None Transfers: Sit to/from Stand Sit to Stand: Min guard         General transfer comment: Min guard assist for safety to power up to standing. Min assist when ambulating due to instability.     Balance Overall balance assessment: Needs assistance Sitting-balance  support: No upper extremity supported;Feet supported Sitting balance-Leahy Scale: Fair     Standing balance support: Bilateral upper extremity supported Standing balance-Leahy Scale: Fair Standing balance comment: Requires support from OT for dynamic tasks.                            ADL either performed or assessed with clinical judgement   ADL Overall ADL's : Needs assistance/impaired Eating/Feeding: Sitting;Supervision/ safety Eating/Feeding Details (indicate cue type and reason): Decreased oral motor skills for safe eating.  Grooming: Min guard;Standing Grooming Details (indicate cue type and reason): Decreased awareness of L side of sink during hand washing.  Upper Body Bathing: Minimal assistance;Sitting   Lower Body Bathing: Sit to/from stand;Minimal assistance   Upper Body Dressing : Minimal assistance;Sitting   Lower Body Dressing: Minimal assistance;Sit to/from stand   Toilet Transfer: Minimal assistance;Ambulation;Regular Toilet;Grab bars   Toileting- Clothing Manipulation and Hygiene: Minimal assistance;Sit to/from stand Toileting - Clothing Manipulation Details (indicate cue type and reason): Difficulty manipulating toilet paper and continuing with task.      Functional mobility during ADLs: Minimal assistance General ADL Comments: Pt with noted decreased awareness of L side of visual field and body.      Vision Patient Visual Report: No change from baseline Vision Assessment?: Yes Eye Alignment: Within Functional Limits Ocular Range of Motion: Within Functional Limits Alignment/Gaze Preference: Within Defined Limits Tracking/Visual Pursuits: Impaired - to be further tested in functional context(difficulty sustaining position of eyes to the L of midline) Visual Fields: Impaired-to be further tested in functional context(inattention to L side of visual field during hand  washing) Additional Comments: Pt with difficulty tracking to the L due to  difficulty holding eye position out of midline.      Perception     Praxis      Pertinent Vitals/Pain Pain Assessment: No/denies pain     Hand Dominance Right(pt states she is R hand dominant; however, per charting L)   Extremity/Trunk Assessment Upper Extremity Assessment Upper Extremity Assessment: LUE deficits/detail LUE Deficits / Details: Difficulty with L middle and fourth finger flexion, poor coordination limiting ability to manipulate toilet paper. Decreased strength noted 3/5 grossly.  LUE Sensation: WNL LUE Coordination: decreased fine motor;decreased gross motor   Lower Extremity Assessment Lower Extremity Assessment: Defer to PT evaluation LLE Deficits / Details: decreased functional strength and coordination LLE Coordination: decreased gross motor   Cervical / Trunk Assessment Cervical / Trunk Assessment: Normal   Communication Communication Communication: Prefers language other than English(ipad interpreter Tram [phone])   Cognition Arousal/Alertness: Awake/alert Behavior During Therapy: Flat affect Overall Cognitive Status: Difficult to assess                                 General Comments: Pt able to follow simple commands. However, she did demonstrate decreased ability to sequence toileting hygiene tasks.    General Comments  son present throughout session. Pt demonstrates left side inattention    Exercises     Shoulder Instructions      Home Living Family/patient expects to be discharged to:: Private residence Living Arrangements: Children Available Help at Discharge: Family;Available 24 hours/day Type of Home: House Home Access: Stairs to enter [employer] of Steps: 3   Home Layout: One level               Home Equipment: Kasandra Knudsen - single point      Lives With: Family    Prior Functioning/Environment Level of Independence: Independent        Comments: Reported to PT that she uses         OT Problem  List: Decreased strength;Decreased range of motion;Decreased activity tolerance;Decreased safety awareness;Decreased knowledge of use of DME or AE;Decreased knowledge of precautions;Impaired UE functional use;Impaired balance (sitting and/or standing);Impaired vision/perception;Decreased coordination;Decreased cognition      OT Treatment/Interventions: Self-care/ADL training;Therapeutic exercise;Energy conservation;DME and/or AE instruction;Therapeutic activities;Patient/family education;Cognitive remediation/compensation;Visual/perceptual remediation/compensation;Balance training    OT Goals(Current goals can be found in the care plan section) Acute Rehab OT Goals Patient Stated Goal: pt did not state a goal OT Goal Formulation: With patient Time For Goal Achievement: 01/11/18 Potential to Achieve Goals: Good ADL Goals Pt Will Perform Grooming: with modified independence;standing Pt Will Perform Lower Body Dressing: with modified independence;sit to/from stand Pt Will Transfer to Toilet: with modified independence;ambulating;bedside commode Pt Will Perform Toileting - Clothing Manipulation and hygiene: with modified independence;sit to/from stand Pt/caregiver will Perform Home Exercise Program: Increased strength;Increased ROM;Left upper extremity;With written HEP provided;With Supervision Additional ADL Goal #1: Pt will utilize 2 compensatory strategies to improve attention to L side of visual field during self feeding tasks.  OT Frequency: Min 2X/week   Barriers to D/C:            Co-evaluation              AM-PAC PT "6 Clicks" Daily Activity     Outcome Measure Help from another person eating meals?: A Little Help from another person taking care of personal grooming?: A Little Help from another person toileting, which  includes using toliet, bedpan, or urinal?: A Little Help from another person bathing (including washing, rinsing, drying)?: A Little Help from another person to  put on and taking off regular upper body clothing?: A Little Help from another person to put on and taking off regular lower body clothing?: A Little 6 Click Score: 18   End of Session Nurse Communication: Mobility status  Activity Tolerance: Patient tolerated treatment well Patient left: in bed;with call bell/phone within reach;with bed alarm set  OT Visit Diagnosis: Other abnormalities of gait and mobility (R26.89);Hemiplegia and hemiparesis Hemiplegia - Right/Left: Left Hemiplegia - caused by: Cerebral infarction                Time: 1157-2620 OT Time Calculation (min): 23 min Charges:  OT General Charges $OT Visit: 1 Visit OT Evaluation $OT Eval Moderate Complexity: 1 Mod OT Treatments $Self Care/Home Management : 8-22 mins G-Codes:     Norman Herrlich, MS OTR/L  Pager: [place] [facility] [place] [facility] 12/28/2017, 12:28 PM

## 2017-12-28 NOTE — Evaluation (Signed)
Speech Language Pathology Evaluation Patient Details Name: Mikayla Reynolds MRN: [id number] DOB: 1941-01-26 Today's Date: 12/28/2017 Time: 3536-1443 SLP Time Calculation (min) (ACUTE ONLY): 33 min  Problem List:  Patient Active Problem List   Diagnosis Date Noted  . Stroke ([place]) 12/27/2017  . Elevated troponin 12/27/2017  . Chest pain 12/27/2017  . Essential hypertension    Past Medical History:  Past Medical History:  Diagnosis Date  . Pre-diabetes   . TIA (transient ischemic attack)    Past Surgical History:  Past Surgical History:  Procedure Laterality Date  . APPENDECTOMY     HPI:  Mikayla Reynolds is an 77 y.o. [place] female with a past medical history of 2 previous TIAs, hypertension who presents to the ED with a 2-day complaint of left sided hemiparesis, left paresthesia, dysphagia, dysarthria left facial droop and impaired mobility. 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   Assessment / Plan / Recommendation Clinical Impression   Patient presents with what appear to be moderate cognitive deficits and mild dysarthria; impairments noted today in sustained/selective attention, immediate and delayed recall, orientation, and visuoperception. Per pt's son and language interpreter, pt's intelligibility decreased primarily due to low vocal intensity; repetition in louder voice improves clarity, though there is mild articulatory imprecision. Overall slow processing, decreased initiation. Administered portions of MOCA-Basic via teleinterpretation services in [place]. Pt scored 11/26 (pt's max possible score would be 15/30 for full adminstration, which indicates severe impairment; >26 is WNL). In visual attention and visuoperception tasks, pt naming numbers/objects beginning at midline and to right side only; attends to left with visual/verbal  cues. Decreased awareness of left side places pt at risk for falls; recommend 24 hour supervision and continued ST services (HHSLP) in order to maximize cognition and safety, decrease caregiver burden and improve quality of life. Pt, son in agreement. Will follow acutely.      SLP Assessment  SLP Recommendation/Assessment: Patient needs continued Speech Lanaguage Pathology Services SLP Visit Diagnosis: Attention and concentration deficit;Cognitive communication deficit (R41.841) Attention and concentration deficit following: Cerebral infarction    Follow Up Recommendations  24 hour supervision/assistance;Home health SLP    Frequency and Duration min 2x/week  2 weeks      SLP Evaluation Cognition  Overall Cognitive Status: Impaired/Different from baseline Arousal/Alertness: Awake/alert Orientation Level: Oriented to person;Oriented to place;Disoriented to time;Oriented to situation(k) Attention: Focused;Sustained;Selective Focused Attention: Appears intact Sustained Attention: Impaired Sustained Attention Impairment: Verbal basic;Functional basic(cues for sustained attention to simple cognitive tasks) Selective Attention: Impaired Selective Attention Impairment: Verbal basic;Functional basic(selective digit naming impaired) Memory: Impaired Memory Impairment: Storage deficit;Decreased short term memory;Decreased recall of new information Decreased Short Term Memory: Verbal basic;Functional basic(delayed recall 1/5, immediate recall 3/5) Problem Solving: Impaired Problem Solving Impairment: Functional basic Executive Function: Decision Making;Initiating Decision Making: Impaired Decision Making Impairment: Functional basic Initiating: Impaired Initiating Impairment: Functional basic;Verbal basic Safety/Judgment: Impaired       Comprehension  Auditory Comprehension Overall Auditory Comprehension: Appears within functional limits for tasks assessed Yes/No Questions: Within  Functional Limits Commands: Within Functional Limits(slow processing, repetition required) Conversation: Simple Interfering Components: Attention;Processing speed;Working Curator: (left inattention) Reading Comprehension Reading Status: Not tested    Expression Expression Primary Mode of Expression: Verbal Verbal Expression Overall Verbal Expression: Impaired Naming: Impairment Confrontation: Within functional limits(4/4 ) Divergent: 25-49% accurate(4 fruits in 60 seconds) Pragmatics: Impairment Impairments: Abnormal affect;Eye contact Interfering Components: Attention Non-Verbal  Means of Communication: Not applicable Written Expression Dominant Hand: Right(per charting pt left handed, pt reports R dominant) Written Expression: Not tested   Oral / Motor  Oral Motor/Sensory Function Overall Oral Motor/Sensory Function: Moderate impairment Facial ROM: Reduced left;Suspected CN VII (facial) dysfunction Facial Symmetry: Abnormal symmetry left;Suspected CN VII (facial) dysfunction Facial Strength: Reduced left;Suspected CN VII (facial) dysfunction Facial Sensation: Within Functional Limits Lingual ROM: Reduced left;Suspected CN XII (hypoglossal) dysfunction Lingual Symmetry: Within Functional Limits Lingual Strength: Reduced;Suspected CN XII (hypoglossal) dysfunction Velum: Within Functional Limits Mandible: Within Functional Limits Motor Speech Overall Motor Speech: Impaired Respiration: Impaired Level of Impairment: Word Phonation: Low vocal intensity Resonance: Within functional limits Articulation: Impaired Intelligibility: Intelligibility reduced Conversation: 75-100% accurate(interpreter able to understand with repetition, louder speec) Motor Planning: Witnin functional limits Effective Techniques: Increased vocal intensity   GO                   Deneise Lever, MS, CCC-SLP Speech-Language  Pathologist [phone]  Aliene Altes 12/28/2017, 10:14 AM

## 2017-12-29 ENCOUNTER — Inpatient Hospital Stay (HOSPITAL_COMMUNITY): Payer: Medicaid Other

## 2017-12-29 DIAGNOSIS — I340 Nonrheumatic mitral (valve) insufficiency: Secondary | ICD-10-CM

## 2017-12-29 LAB — BASIC METABOLIC PANEL
ANION GAP: 7 (ref 5–15)
BUN: 10 mg/dL (ref 6–20)
CALCIUM: 8.6 mg/dL — AB (ref 8.9–10.3)
CO2: 23 mmol/L (ref 22–32)
Chloride: 106 mmol/L (ref 101–111)
Creatinine, Ser: 1.05 mg/dL — ABNORMAL HIGH (ref 0.44–1.00)
GFR calc non Af Amer: 51 mL/min — ABNORMAL LOW (ref >60)
GFR, EST AFRICAN AMERICAN: 59 mL/min — AB (ref >60)
Glucose, Bld: 81 mg/dL (ref 65–99)
Potassium: 3.7 mmol/L (ref 3.5–5.1)
Sodium: 136 mmol/L (ref 135–145)

## 2017-12-29 LAB — GLUCOSE, CAPILLARY: Glucose-Capillary: 90 mg/dL (ref 65–99)

## 2017-12-29 LAB — ECHOCARDIOGRAM COMPLETE
HEIGHTINCHES: 63 in
Weight: 1911.83 oz

## 2017-12-29 MED ORDER — CLOPIDOGREL BISULFATE 75 MG PO TABS: 75.0000 mg | ORAL_TABLET | Freq: Every day | ORAL | 0 refills | Status: DC

## 2017-12-29 MED ORDER — ASPIRIN 81 MG PO TBEC: 81.0000 mg | DELAYED_RELEASE_TABLET | Freq: Every day | ORAL | 0 refills | Status: DC

## 2017-12-29 MED ORDER — ACETAMINOPHEN 325 MG PO TABS
650.0000 mg | ORAL_TABLET | Freq: Four times a day (QID) | ORAL | Status: DC | PRN
  Administered 2017-12-29: 650 mg via ORAL
  Filled 2017-12-29: qty 2

## 2017-12-29 MED ORDER — ATORVASTATIN CALCIUM 40 MG PO TABS: 40.0000 mg | ORAL_TABLET | Freq: Every day | ORAL | 0 refills | Status: DC

## 2017-12-29 NOTE — Care Management Note (Signed)
Case Management Note  Patient Details  Name: Mikayla Reynolds MRN: [id number] Date of Birth: 22-Aug-1941  Subjective/Objective:    Pt admitted with CVA. She is from home with her son. Pt without PCP and insurance.                 Action/Plan: CM was able to obtain her an appointment at [facility] Information on the AVS.  Pt with orders for [facility] services. CM notified Butch Penny with [facility] to see if she will qualify for charity [facility]. Per Butch Penny she should qualify.  Pt with recommendations for 3 in 1. Son refused.  Son to provide supervision at home and transportation to home.   Expected Discharge Date:                  Expected Discharge Plan:  [place]  In-House Referral:     Discharge planning Services  CM Consult  Post Acute Care Choice:  Home Health Choice offered to:  Adult Children  DME Arranged:    DME Agency:     HH Arranged:  PT, Speech Therapy HH Agency:  [facility](charity)  Status of Service:  Completed, signed off  If discussed at [place] of Stay Meetings, dates discussed:    Additional Comments:  Pollie Friar, RN 12/29/2017, 1:02 PM

## 2017-12-29 NOTE — Progress Notes (Signed)
Pt. With discharge orders.  D/C instructions given in [place] and English to the patient and family. Grandson who understands and speaks both languages verbalized understanding in english to this Probation officer and asked questions to clarify instructions. Discharged via wheelchair.

## 2017-12-29 NOTE — Progress Notes (Signed)
  Echocardiogram 2D Echocardiogram has been performed.  Mikayla Reynolds 12/29/2017, 11:35 AM

## 2017-12-29 NOTE — Progress Notes (Signed)
Physical Therapy Treatment Patient Details Name: Mikayla Reynolds MRN: [id number] DOB: Apr 02, 1941 Today's Date: 12/29/2017    History of Present Illness Pt admitted with R MCA stroke and elevated troponins. PMH significant for 2 prior TIAs and HTN.     PT Comments    Patient received in bed, family present and assisted in translation today. She is able to complete functional bed mobility with Mod(I) today, however continues to require Min guard and cues for safety for functional transfers today. She continues to demonstrate ongoing L inattention as well as difficultly keeping L hand on grip of walker and requiring intermittent MinA to maintain hand placement as well as straight line navigation today. She declines up to chair and was left in bed with all needs met, family present.    Follow Up Recommendations  Home health PT;Supervision/Assistance - 24 hour     Equipment Recommendations  Rolling walker with 5" wheels    Recommendations for Other Services OT consult;Speech consult     Precautions / Restrictions Precautions Precautions: Fall Restrictions Weight Bearing Restrictions: No    Mobility  Bed Mobility Overal bed mobility: Needs Assistance Bed Mobility: Supine to Sit;Sit to Supine     Supine to sit: Modified independent (Device/Increase time) Sit to supine: Modified independent (Device/Increase time)      Transfers Overall transfer level: Needs assistance Equipment used: Rolling walker (2 wheeled) Transfers: Sit to/from Stand Sit to Stand: Min guard         General transfer comment: Min guard for safety, verbal cues for safety and sequencing   Ambulation/Gait Ambulation/Gait assistance: Min assist Ambulation Distance (Feet): 100 Feet Assistive device: Rolling walker (2 wheeled) Gait Pattern/deviations: Step-through pattern;Decreased step length - right;Decreased step length - left;Decreased stride length;Narrow base of support     General Gait Details:  ongoing difficulty maintaining grip with L hand and veering to the R, asked OT to assess if patient appropriate for larger hand grip on L to assist in abiilty to grip walker    Stairs             Wheelchair Mobility    Modified Rankin (Stroke Patients Only) Modified Rankin (Stroke Patients Only) Pre-Morbid Rankin Score: No symptoms Modified Rankin: Moderate disability     Balance Overall balance assessment: Needs assistance Sitting-balance support: No upper extremity supported;Feet supported Sitting balance-Leahy Scale: Good     Standing balance support: Bilateral upper extremity supported Standing balance-Leahy Scale: Fair                              Cognition Arousal/Alertness: Awake/alert Behavior During Therapy: Flat affect Overall Cognitive Status: Difficult to assess                                        Exercises      General Comments General comments (skin integrity, edema, etc.): son present and assisted with translation during session, patient with ongoing L inattention       Pertinent Vitals/Pain Pain Assessment: No/denies pain    Home Living                      Prior Function            PT Goals (current goals can now be found in the care plan section) Acute Rehab PT Goals Patient Stated Goal: pt did  not state a goal PT Goal Formulation: With patient/family Time For Goal Achievement: 01/04/18 Potential to Achieve Goals: Good Progress towards PT goals: Progressing toward goals    Frequency    Min 4X/week      PT Plan Current plan remains appropriate;Equipment recommendations need to be updated    Co-evaluation              AM-PAC PT "6 Clicks" Daily Activity  Outcome Measure  Difficulty turning over in bed (including adjusting bedclothes, sheets and blankets)?: None Difficulty moving from lying on back to sitting on the side of the bed? : None Difficulty sitting down on and standing  up from a chair with arms (e.g., wheelchair, bedside commode, etc,.)?: A Little Help needed moving to and from a bed to chair (including a wheelchair)?: A Little Help needed walking in hospital room?: A Little Help needed climbing 3-5 steps with a railing? : A Lot 6 Click Score: 19    End of Session Equipment Utilized During Treatment: Gait belt Activity Tolerance: Patient tolerated treatment well Patient left: in bed;with call bell/phone within reach;with family/visitor present   PT Visit Diagnosis: Unsteadiness on feet (R26.81)     Time: 1040-1100 PT Time Calculation (min) (ACUTE ONLY): 20 min  Charges:  $Gait Training: 8-22 mins                    G Codes:       Deniece Ree PT, DPT, CBIS  Supplemental Physical Therapist [place]   Pager [phone]

## 2017-12-29 NOTE — Discharge Summary (Signed)
Physician Discharge Summary  Mikayla Reynolds HCW:[id number] DOB: 09/28/1940 DOA: 12/27/2017  PCP: Mikayla Reynolds, No Pcp Per  Admit date: 12/27/2017 Discharge date: 12/29/2017  Admitted From: Home Disposition:  Home  Discharge Condition:Stable CODE STATUS:FULL Diet recommendation: Dysphagia 2 diet  Brief/Interim Summary:  Mikayla Reynolds is a 77 female , [place] speaking , with past medical history of TIA, hypertension who presented to the emergency department with complaints of  left-sided weakness along with dysphagia and left facial droop for  2 days.  Mikayla Reynolds not  able to swallow the food.  Mikayla Reynolds also complained of intermittent chest pain for last 1 month and dyspnea on exertion.  CT of the brain showed right MCA CVA.  Troponin mildly elevated. Cardiology and neurology consulted. Mikayla Reynolds's hospital course remained stable she was evaluated by physical therapy and recommended home health .Mikayla Reynolds was seen by cardiology and no further testing recommended right now and she might need to follow-up with cardiology for outpatient stress testing.  She does not have any chest pain right now. Mikayla Reynolds has been started on dual antiplatelet therapy.  She is stable to be discharged to home with home health.  She will follow-up with neurology and cardiology as an outpatient.  Following problems were addressed during hospitalization:  Acute ischemic stroke: CT of the brain showed right MCA territory CVA. CT head /neck showed right distal M1 occlusion.  Neurology was  following.   Speech recommended dysphagia 2 diet.  She will follow-up with speech therapy at home. LDL is 120. Hemoglobin A1c of 6. Continue aspirin,plavix and Lipitor for 3 weeks and then discontinue aspirin.Continue Lipitor. She has left-sided weakness more on the upper extremity.  Chest pain/dyspnea with exertion: Cardiology was following.  Mildly elevated troponin.  Did not trend upward.  Currently denies any chest pain. Echocardiogram showed  moderate left ventricular hypertrophy, ejection fraction of 50 to 55%. Cardiology recommending outpatient stress testing.  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  No previous reports available.Stable.  Check BMP test in a week.   Discharge Diagnoses:  Principal Problem:   Stroke [facility]) Active Problems:   Elevated troponin   Chest pain   Essential hypertension    Discharge Instructions  Discharge Instructions    Ambulatory referral to Neurology   Complete by:  As directed    An appointment is requested in approximately: 3 weeks   Diet - low sodium heart healthy   Complete by:  As directed    Discharge instructions   Complete by:  As directed    1) Take prescribed medications as instructed.  Take aspirin and Plavix for 3 weeks and then continue just aspirin. 2) Follow up at [facility] .Do a BMP test during the follow up. 3)Follow up with neurology as an outpatient.  Name and number of the provider group has been attached. 4) Follow up with home physical therapy.   Increase activity slowly   Complete by:  As directed      Allergies as of 12/29/2017   No Known Allergies     Medication List    TAKE these medications   aspirin 81 MG EC tablet Take 1 tablet (81 mg total) by mouth daily. Start taking on:  12/30/2017   atorvastatin 40 MG tablet Commonly known as:  LIPITOR Take 1 tablet (40 mg total) by mouth daily at 6 PM.   clopidogrel 75 MG tablet Commonly known as:  PLAVIX Take 1 tablet (75 mg total) by mouth daily for 21 days.  FISH OIL PO Take 2 capsules by mouth every evening.   PRESCRIPTION MEDICATION Take 20 mg by mouth See admin instructions. "Vastarel" 20 mg -trimetazidine dihydrochloride - from [place] [facility]/ take one tablet (20 mg) twice daily   PRESCRIPTION MEDICATION Take 4 mg by mouth See admin instructions. Candesartan 8 mg - from [place] [facility] - take 1/2 tablet (4 mg) by mouth every morning    PRESCRIPTION MEDICATION Take 0.25 mg by mouth See admin instructions. Alprazolam 0.5 mg - from [place] pharmacy/ take 1/2 tablet (0.25 mg) by mouth daily at bedtime      Follow-up [facility] up on 01/08/2018.   Why:  Your appointment is at 3 pm. Please arrive 15 min early. Please bring ID and your current medications.  Contact information: [place] [phone] [facility].   Why:  They will contact you for the first appointment. Contact information: [address] [address] [phone]        [facility]. Schedule an appointment as soon as possible for a visit in 3 week(s).   Specialty:  Neurology Contact information: [address] [place] [phone]         No Known Allergies  Consultations: Neurology, cardiology  Procedures/Studies: Ct Angio Head W Or Wo Contrast  Result Date: 12/27/2017 CLINICAL DATA:  Right MCA distribution stroke. EXAM: CT ANGIOGRAPHY HEAD AND NECK TECHNIQUE: Multidetector CT imaging of the head and neck was performed using the standard protocol during bolus administration of intravenous contrast. Multiplanar CT image reconstructions and MIPs were obtained to evaluate the vascular anatomy. Carotid stenosis measurements (when applicable) are obtained utilizing NASCET criteria, using the distal internal carotid diameter as the denominator. CONTRAST:  6mL ISOVUE-370 IOPAMIDOL (ISOVUE-370) INJECTION 76% COMPARISON:  Noncontrast head CT from earlier today FINDINGS: CTA NECK FINDINGS Aortic arch: Atherosclerotic plaque.  Three vessel branching. Right carotid system: Limited atheromatous changes. No stenosis or ulceration. Left carotid system: No noted atheromatous changes. No stenosis or ulceration. Vertebral arteries: No proximal subclavian  stenosis. Mild left vertebral artery dominance. The vertebral arteries are smooth and diffusely patent to the dura Skeleton: No acute or aggressive finding Other neck: Negative Upper chest: No acute finding Review of the MIP images confirms the above findings CTA HEAD FINDINGS Anterior circulation: Atherosclerotic plaque on the carotid siphons without flow limiting stenosis. Right distal M1 occlusion. There is prompt reconstitution but the subsequent branches are under filled compared to the left. Posterior circulation: Vertebrobasilar arteries are smooth and diffusely patent. Robust flow in bilateral posterior cerebral arteries. Negative for aneurysm Venous sinuses: Negative Anatomic variants: None significant Delayed phase: No abnormal intracranial enhancement These results were called by telephone at the time of interpretation on 12/27/2017 at 6:06 pm to Dr. Jacob Moores , who verbally acknowledged these results. Review of the MIP images confirms the above findings IMPRESSION: 1. Right distal M1 occlusion. Right M2 and M3 branch reconstitution with underfilling compared to the left. 2. No embolic source identified. Mild atheromatous changes without atheromatous stenosis. Electronically Signed   By: Monte Fantasia M.D.   On: 12/27/2017 18:07   Dg Chest 2 View  Result Date: 12/27/2017 CLINICAL DATA:  77 y.o. [place] female with a past medical history of 2 previous TIAs, hypertension who presents to the ED with a 2-day complaint of left sided hemiparesis,  left paresthesia, dysphagia, dysarthria left facial droop and impaired mobility EXAM: CHEST - 2 VIEW COMPARISON:  None. FINDINGS: Cardiac silhouette is mildly enlarged. No mediastinal or hilar masses. No evidence of adenopathy. Mild prominence of the bronchovascular markings diffusely. Lungs are clear. No pleural effusion or pneumothorax. Skeletal structures are demineralized but intact. IMPRESSION: No acute cardiopulmonary disease.  Mild cardiomegaly.  Electronically Signed   By: Lajean Manes M.D.   On: 12/27/2017 18:44   Ct Head Wo Contrast  Result Date: 12/27/2017 CLINICAL DATA:  Sudden onset of slurred speech. EXAM: CT HEAD WITHOUT CONTRAST TECHNIQUE: Contiguous axial images were obtained from the base of the skull through the vertex without intravenous contrast. COMPARISON:  None. FINDINGS: Brain: Moderate confluent cortically based infarct in the anterior right insula and frontal operculum. Discrete patchy infarct in the posterior right putamen and along the right frontal parietal cortex. No hemorrhage, hydrocephalus, or masslike finding. Vascular: No definite hyperdense vessel. Tiny area of high density near the right M1 segment on coronal reformats is not localized or persist on the sagittal reformats. Skull: No acute or aggressive finding Sinuses/Orbits: Negative Other: These results were called by telephone at the time of interpretation on 12/27/2017 at 2:45 pm to Dr. Gareth Morgan , who verbally acknowledged these results. IMPRESSION: Acute right MCA distribution infarct. Moderate confluent infarct centered at the right insula and frontal operculum. Patchy infarct in the right frontal parietal cortex. Negative for hemorrhage. Electronically Signed   By: Monte Fantasia M.D.   On: 12/27/2017 14:53   Ct Angio Neck W Or Wo Contrast  Result Date: 12/27/2017 CLINICAL DATA:  Right MCA distribution stroke. EXAM: CT ANGIOGRAPHY HEAD AND NECK TECHNIQUE: Multidetector CT imaging of the head and neck was performed using the standard protocol during bolus administration of intravenous contrast. Multiplanar CT image reconstructions and MIPs were obtained to evaluate the vascular anatomy. Carotid stenosis measurements (when applicable) are obtained utilizing NASCET criteria, using the distal internal carotid diameter as the denominator. CONTRAST:  56mL ISOVUE-370 IOPAMIDOL (ISOVUE-370) INJECTION 76% COMPARISON:  Noncontrast head CT from earlier today  FINDINGS: CTA NECK FINDINGS Aortic arch: Atherosclerotic plaque.  Three vessel branching. Right carotid system: Limited atheromatous changes. No stenosis or ulceration. Left carotid system: No noted atheromatous changes. No stenosis or ulceration. Vertebral arteries: No proximal subclavian stenosis. Mild left vertebral artery dominance. The vertebral arteries are smooth and diffusely patent to the dura Skeleton: No acute or aggressive finding Other neck: Negative Upper chest: No acute finding Review of the MIP images confirms the above findings CTA HEAD FINDINGS Anterior circulation: Atherosclerotic plaque on the carotid siphons without flow limiting stenosis. Right distal M1 occlusion. There is prompt reconstitution but the subsequent branches are under filled compared to the left. Posterior circulation: Vertebrobasilar arteries are smooth and diffusely patent. Robust flow in bilateral posterior cerebral arteries. Negative for aneurysm Venous sinuses: Negative Anatomic variants: None significant Delayed phase: No abnormal intracranial enhancement These results were called by telephone at the time of interpretation on 12/27/2017 at 6:06 pm to Dr. Jacob Moores , who verbally acknowledged these results. Review of the MIP images confirms the above findings IMPRESSION: 1. Right distal M1 occlusion. Right M2 and M3 branch reconstitution with underfilling compared to the left. 2. No embolic source identified. Mild atheromatous changes without atheromatous stenosis. Electronically Signed   By: Monte Fantasia M.D.   On: 12/27/2017 18:07       Subjective: Mikayla Reynolds seen and examined at bedside this morning.  Remains comfortable.  Left-sided weakness  improving.  Stable to discharge to home today.  Discharge Exam: Vitals:   12/29/17 0758 12/29/17 1200  BP: 118/63 118/71  Pulse: 67 (!) 57  Resp: 18 18  Temp: 98.2 F (36.8 C) 98.3 F (36.8 C)  SpO2: 99% 100%   Vitals:   12/29/17 0042 12/29/17 0410 12/29/17  0758 12/29/17 1200  BP: (!) 109/57 107/70 118/63 118/71  Pulse: 60 (!) 59 67 (!) 57  Resp: 20 18 18 18   Temp: 98.4 F (36.9 C) 98.1 F (36.7 C) 98.2 F (36.8 C) 98.3 F (36.8 C)  TempSrc: Oral Oral Oral Oral  SpO2: 98% 97% 99% 100%  Weight:      Height:        General: Pt is alert, awake, not in acute distress Cardiovascular: RRR, S1/S2 +, no rubs, no gallops Respiratory: CTA bilaterally, no wheezing, no rhonchi Abdominal: Soft, NT, ND, bowel sounds + Extremities: no edema, no cyanosis    The results of significant diagnostics from this hospitalization (including imaging, microbiology, ancillary and laboratory) are listed below for reference.     Microbiology: No results found for this or any previous visit (from the past 240 hour(s)).   Labs: BNP (last 3 results) No results for input(s): BNP in the last 8760 hours. Basic Metabolic Panel: Recent Labs  Lab 12/27/17 1252 12/27/17 1316 12/28/17 0529 12/29/17 0308  NA 135 139 137 136  K 4.1 4.2 3.9 3.7  CL 101 103 103 106  CO2 24  --  22 23  GLUCOSE 108* 107* 73 81  BUN 16 17 13 10   CREATININE 1.16* 1.10* 1.10* 1.05*  CALCIUM 9.0  --  8.7* 8.6*   Liver Function Tests: Recent Labs  Lab 12/27/17 1252 12/28/17 0529  AST 41 37  ALT 25 20  ALKPHOS 86 80  BILITOT 1.6* 2.0*  PROT 7.7 6.9  ALBUMIN 3.5 3.2*   No results for input(s): LIPASE, AMYLASE in the last 168 hours. No results for input(s): AMMONIA in the last 168 hours. CBC: Recent Labs  Lab 12/27/17 1252 12/27/17 1316 12/28/17 0529  WBC 6.5  --  5.3  NEUTROABS 3.8  --   --   HGB 12.1 12.6 12.2  HCT 36.4 37.0 37.1  MCV 96.3  --  95.1  PLT 180  --  159   Cardiac Enzymes: Recent Labs  Lab 12/27/17 1630 12/27/17 2139 12/28/17 0529  TROPONINI 0.10* 0.13* 0.12*   BNP: Invalid input(s): POCBNP CBG: Recent Labs  Lab 12/27/17 1411  GLUCAP 90   D-Dimer No results for input(s): DDIMER in the last 72 hours. Hgb A1c Recent Labs     12/28/17 0529  HGBA1C 6.0*   Lipid Profile Recent Labs    12/28/17 0529  CHOL 170  HDL 35*  LDLCALC 120*  TRIG 76  CHOLHDL 4.9   Thyroid function studies No results for input(s): TSH, T4TOTAL, T3FREE, THYROIDAB in the last 72 hours.  Invalid input(s): FREET3 Anemia work up No results for input(s): VITAMINB12, FOLATE, FERRITIN, TIBC, IRON, RETICCTPCT in the last 72 hours. Urinalysis No results found for: COLORURINE, APPEARANCEUR, LABSPEC, [place], GLUCOSEU, HGBUR, BILIRUBINUR, KETONESUR, PROTEINUR, UROBILINOGEN, NITRITE, LEUKOCYTESUR Sepsis Labs Invalid input(s): PROCALCITONIN,  WBC,  LACTICIDVEN Microbiology No results found for this or any previous visit (from the past 240 hour(s)).   Time coordinating discharge: 35 minutes  SIGNED:   Shelly Coss, MD  [facility] 12/29/2017, 1:26 PM Pager [phone]  If 7PM-7AM, please contact night-coverage [email or web address] Password [email or web address]

## 2017-12-31 ENCOUNTER — Ambulatory Visit: Payer: Self-pay

## 2017-12-31 NOTE — Progress Notes (Deleted)
Patient ID: Mikayla Reynolds, female   DOB: May 28, 1941, 77 y.o.   MRN: [id number] Hospitalization 12/27/2017-12/29/2017 Patient is a 54 female,Vietnamese speaking ,with past medical history of TIA, hypertension who presented to the emergencydepartment with complaints of left-sided weakness along with dysphagia and left facial droop for  2 days. Patient not able to swallow the food. Patient also complained of intermittent chest pain for last 1 month and dyspnea on exertion. CT of the brain showed right MCA CVA.Troponin mildly elevated. Cardiology and neurology consulted. Patient's hospital course remained stable she was evaluated by physical therapy and recommended home health .Patient was seen by cardiology and no further testing recommended right now and she might need to follow-up with cardiology for outpatient stress testing.  She does not have any chest pain right now. Patient has been started on dual antiplatelet therapy.  She is stable to be discharged to home with home health.  She will follow-up with neurology and cardiology as an outpatient.  Following problems were addressed during hospitalization:  Acute ischemic stroke: CT of the brain showed right MCAterritoryCVA.CT head /neck showed right distal M1 occlusion.Neurology was  following.  Speechrecommended dysphagia 2 diet.  She will follow-up with speech therapy at home. LDL is 120. Hemoglobin A1c of 6. Continue aspirin,plavix and Lipitor for 3 weeks and then discontinue aspirin.Continue Lipitor. She has left-sided weakness more on the upper extremity.  Chest pain/dyspnea with exertion: Cardiology was following. Mildly elevated troponin. Did not trend upward. Currently denies any chest pain. Echocardiogram showed moderate left ventricular hypertrophy, ejection fraction of 50 to 55%. Cardiology recommending outpatient stress testing.  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 No previous reports available.Stable.   Check BMP test in a week.   Discharge Diagnoses:  Principal Problem:   Stroke [facility]) Active Problems:   Elevated troponin   Chest pain   Essential hypertension    Discharge Instructions      Discharge Instructions    Ambulatory referral to Neurology   Complete by:  As directed    An appointment is requested in approximately: 3 weeks   Diet - low sodium heart healthy   Complete by:  As directed    Discharge instructions   Complete by:  As directed    1) Take prescribed medications as instructed.  Take aspirin and Plavix for 3 weeks and then continue just aspirin. 2) Follow up at [facility] .Do a BMP test during the follow up. 3)Follow up with neurology as an outpatient.  Name and number of the provider group has been attached. 4) Follow up with home physical therapy.   Increase activity slowly

## 2018-01-08 ENCOUNTER — Ambulatory Visit: Payer: Self-pay

## 2018-01-14 ENCOUNTER — Ambulatory Visit: Payer: Medicaid Other | Attending: Family Medicine | Admitting: Physician Assistant

## 2018-01-14 ENCOUNTER — Telehealth: Payer: Self-pay | Admitting: General Practice

## 2018-01-14 VITALS — BP 130/78 | HR 62 | Temp 98.5°F | Resp 16 | Ht 63.39 in | Wt 129.4 lb

## 2018-01-14 DIAGNOSIS — I10 Essential (primary) hypertension: Secondary | ICD-10-CM

## 2018-01-14 DIAGNOSIS — Z7902 Long term (current) use of antithrombotics/antiplatelets: Secondary | ICD-10-CM | POA: Diagnosis not present

## 2018-01-14 DIAGNOSIS — N182 Chronic kidney disease, stage 2 (mild): Secondary | ICD-10-CM | POA: Diagnosis not present

## 2018-01-14 DIAGNOSIS — Z8673 Personal history of transient ischemic attack (TIA), and cerebral infarction without residual deficits: Secondary | ICD-10-CM | POA: Insufficient documentation

## 2018-01-14 DIAGNOSIS — R079 Chest pain, unspecified: Secondary | ICD-10-CM | POA: Insufficient documentation

## 2018-01-14 DIAGNOSIS — Z79899 Other long term (current) drug therapy: Secondary | ICD-10-CM | POA: Insufficient documentation

## 2018-01-14 DIAGNOSIS — R7303 Prediabetes: Secondary | ICD-10-CM | POA: Insufficient documentation

## 2018-01-14 DIAGNOSIS — N289 Disorder of kidney and ureter, unspecified: Secondary | ICD-10-CM | POA: Diagnosis not present

## 2018-01-14 DIAGNOSIS — Z09 Encounter for follow-up examination after completed treatment for conditions other than malignant neoplasm: Secondary | ICD-10-CM | POA: Insufficient documentation

## 2018-01-14 DIAGNOSIS — I129 Hypertensive chronic kidney disease with stage 1 through stage 4 chronic kidney disease, or unspecified chronic kidney disease: Secondary | ICD-10-CM | POA: Diagnosis present

## 2018-01-14 DIAGNOSIS — I639 Cerebral infarction, unspecified: Secondary | ICD-10-CM | POA: Diagnosis not present

## 2018-01-14 MED ORDER — ATORVASTATIN CALCIUM 40 MG PO TABS: 40.0000 mg | ORAL_TABLET | Freq: Every day | ORAL | 0 refills | Status: DC

## 2018-01-14 MED ORDER — CLOPIDOGREL BISULFATE 75 MG PO TABS: 75.0000 mg | ORAL_TABLET | Freq: Every day | ORAL | 5 refills | Status: AC

## 2018-01-14 MED ORDER — ATORVASTATIN CALCIUM 40 MG PO TABS: 40.0000 mg | ORAL_TABLET | Freq: Every day | ORAL | 3 refills | Status: DC

## 2018-01-14 MED FILL — ATORVASTATIN CALCIUM 40 MG: 40 | 30 days supply | Qty: 30 | Fill #0

## 2018-01-14 MED FILL — CLOPIDOGREL 75 MG TABLET: 75 | 30 days supply | Qty: 30 | Fill #0

## 2018-01-14 NOTE — Telephone Encounter (Signed)
CMA called Mikayla Reynolds from [facility] back. Mikayla Reynolds was asking the provider that seen the patient today if the provider thinks the patient need the speech and physical orders that were order from the hospital. Provider, Levada Dy, stated based on what the granddaughter told the provider that patient was able to go back do her normal things and her lifestyle. Provider think the patient do not need the speech and physical order per what granddaughter told her.  Mikayla Reynolds understood and will contact back if further orders are made.

## 2018-01-14 NOTE — Progress Notes (Signed)
Patient ID: Mikayla Reynolds, female   DOB: [birth year], 77 y.o.   MRN: [id number]    Mikayla Reynolds, is a 77 y.o. female  TDV:[id number]  GGY:[id number]  DOB - 20-Jul-1941  Subjective:  Chief Complaint and HPI: Mikayla Reynolds is a 77 y.o. female here today to establish care after Hospitalization 4/27-4/28/2019.  She is feeling good and much stronger.  She says she is back to pre-hospital functioning for ADL.  Grand-daughter is translating.  No referral was made to cardiology yet.  Today, we need to recheck labs, stop aspirin, RF lipitor and Plavix.    From d/c summary: Patient is a 41 female,Vietnamese speaking ,with past medical history of TIA, hypertension who presented to the emergencydepartment with complaints of left-sided weakness along with dysphagia and left facial droop for  2 days. Patient not able to swallow the food. Patient also complained of intermittent chest pain for last 1 month and dyspnea on exertion. CT of the brain showed right MCA CVA.Troponin mildly elevated. Cardiology and neurology consulted. Patient's hospital course remained stable she was evaluated by physical therapy and recommended home health .Patient was seen by cardiology and no further testing recommended right now and she might need to follow-up with cardiology for outpatient stress testing.  She does not have any chest pain right now. Patient has been started on dual antiplatelet therapy.  She is stable to be discharged to home with home health.  She will follow-up with neurology and cardiology as an outpatient.  Following problems were addressed during hospitalization:  Acute ischemic stroke: CT of the brain showed right MCAterritoryCVA.CT head /neck showed right distal M1 occlusion.Neurology was  following.  Speechrecommended dysphagia 2 diet.  She will follow-up with speech therapy at home. LDL is 120. Hemoglobin A1c of 6. Continue aspirin,plavix and Lipitor for 3 weeks and then discontinue  aspirin.Continue Lipitor. She has left-sided weakness more on the upper extremity.  Chest pain/dyspnea with exertion: Cardiology was following. Mildly elevated troponin. Did not trend upward. Currently denies any chest pain. Echocardiogram showed moderate left ventricular hypertrophy, ejection fraction of 50 to 55%. Cardiology recommending outpatient stress testing.  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 No previous reports available.Stable.  Check BMP test in a week.   Discharge Diagnoses:  Principal Problem:   Stroke [facility]) Active Problems:   Elevated troponin   Chest pain   Essential hypertension  ROS:   Constitutional:  No f/c, No night sweats, No unexplained weight loss. EENT:  No vision changes, No blurry vision, No hearing changes. No mouth, throat, or ear problems.  Respiratory: No cough, No SOB Cardiac: No CP, no palpitations GI:  No abd pain, No N/V/D. GU: No Urinary s/sx Musculoskeletal: No joint pain Neuro: No headache, no dizziness, no motor weakness.  Skin: No rash Endocrine:  No polydipsia. No polyuria.  Psych: Denies SI/HI  No problems updated.  ALLERGIES: No Known Allergies  PAST MEDICAL HISTORY: Past Medical History:  Diagnosis Date  . Pre-diabetes   . TIA (transient ischemic attack)     MEDICATIONS AT HOME: Prior to Admission medications   Medication Sig Start Date End Date Taking? Authorizing Provider  atorvastatin (LIPITOR) 40 MG tablet Take 1 tablet (40 mg total) by mouth daily at 6 PM. 01/14/18  Yes Xuan Mateus, Dionne Bucy, PA-C  clopidogrel (PLAVIX) 75 MG tablet Take 1 tablet (75 mg total) by mouth daily for 21 days. 01/14/18 02/04/18 Yes Hiromi Knodel, Dionne Bucy, PA-C  PRESCRIPTION MEDICATION Take 20 mg by mouth See admin instructions. "Vastarel"  20 mg -trimetazidine dihydrochloride - from [place] [facility]/ take one tablet (20 mg) twice daily   Yes [provider]  PRESCRIPTION MEDICATION Take 4 mg by mouth See admin instructions.  Candesartan 8 mg - from [place] [facility] - take 1/2 tablet (4 mg) by mouth every morning   Yes [provider]  PRESCRIPTION MEDICATION Take 0.25 mg by mouth See admin instructions. Alprazolam 0.5 mg - from [place] pharmacy/ take 1/2 tablet (0.25 mg) by mouth daily at bedtime   Yes [provider]  Omega-3 Fatty Acids (FISH OIL PO) Take 2 capsules by mouth every evening.    [provider]     Objective:  EXAM:   Vitals:   01/14/18 1520  BP: 130/78  Pulse: 62  Resp: 16  Temp: 98.5 F (36.9 C)  TempSrc: Oral  SpO2: 99%  Weight: 129 lb 6.4 oz (58.7 kg)  Height: 5' 3.39" (1.61 m)    General appearance : A&OX3. NAD. Non-toxic-appearing HEENT: Atraumatic and Normocephalic.  PERRLA. EOM intact.  Neck: supple, no JVD. No cervical lymphadenopathy. No thyromegaly Chest/Lungs:  Breathing-non-labored, Good air entry bilaterally, breath sounds normal without rales, rhonchi, or wheezing  CVS: S1 S2 regular, no murmurs, gallops, rubs  Extremities: Bilateral Lower Ext shows no edema, both legs are warm to touch with = pulse throughout Neurology:  CN II-XII grossly intact, Non focal.   Psych:  TP linear. J/I WNL. Normal speech. Appropriate eye contact and affect.  Skin:  No Rash  Data Review Lab Results  Component Value Date   HGBA1C 6.0 (H) 12/28/2017     Assessment & Plan   1. Essential hypertension At goal.  Continue current regimen - Ambulatory referral to Cardiology  2. Chest pain, unspecified type No acute CP-but from hospital note - Ambulatory referral to Cardiology  3. Cerebrovascular accident (CVA), unspecified mechanism ([place]) She is back to baseline with no complaints - clopidogrel (PLAVIX) 75 MG tablet; Take 1 tablet (75 mg total) by mouth daily for 21 days.  Dispense: 30 tablet; Refill: 5 - atorvastatin (LIPITOR) 40 MG tablet; Take 1 tablet (40 mg total) by mouth daily at 6 PM.  Dispense: 30 tablet; Refill: 3  4. Kidney function abnormal -  Basic metabolic panel  5.  Hospital discharge follow-up Vast improvement. Will assist with financial packet, etc Spent >45 mins reviewing hospital notes and labs, etc  Patient have been counseled extensively about nutrition and exercise  Return in about 1 month (around 02/14/2018) for assign new PCP; htn/stroke/hyperlipidemia.  The patient was given clear instructions to go to ER or return to medical center if symptoms don't improve, worsen or new problems develop. The patient verbalized understanding. The patient was told to call to get lab results if they haven't heard anything in the next week.     Freeman Caldron, PA-C [facility] [place], [place]   01/14/2018, 3:39 PM

## 2018-01-14 NOTE — Telephone Encounter (Signed)
Sonia Baller from [facility] called to request speech and physical orders for this pt, please follow up [phone]

## 2018-01-15 LAB — BASIC METABOLIC PANEL
BUN/Creatinine Ratio: 22 (ref 12–28)
BUN: 22 mg/dL (ref 8–27)
CHLORIDE: 99 mmol/L (ref 96–106)
CO2: 23 mmol/L (ref 20–29)
Calcium: 9.2 mg/dL (ref 8.7–10.3)
Creatinine, Ser: 0.99 mg/dL (ref 0.57–1.00)
GFR calc Af Amer: 64 mL/min/{1.73_m2} (ref >59)
GFR calc non Af Amer: 56 mL/min/{1.73_m2} — ABNORMAL LOW (ref >59)
GLUCOSE: 101 mg/dL — AB (ref 65–99)
POTASSIUM: 4.6 mmol/L (ref 3.5–5.2)
SODIUM: 136 mmol/L (ref 134–144)

## 2018-01-16 ENCOUNTER — Telehealth: Payer: Self-pay

## 2018-01-16 NOTE — Telephone Encounter (Signed)
-----   Message from [place] Donovan, [place] sent at 01/15/2018 10:42 AM EDT ----- Please call patient.  Kidney function is a little better(almost back to normal) than when she was in the hospital.  Take meds and follow-up as planned.  Thanks, Freeman Caldron, PA-C

## 2018-01-16 NOTE — Telephone Encounter (Signed)
CMA spoke to patient's granddaughter (emergency contact) to inform on lab results.  No questions/concerns.

## 2018-02-19 ENCOUNTER — Ambulatory Visit: Payer: Medicaid Other | Attending: Internal Medicine | Admitting: Internal Medicine

## 2018-02-19 ENCOUNTER — Encounter: Payer: Self-pay | Admitting: Internal Medicine

## 2018-02-19 VITALS — BP 110/68 | HR 60 | Temp 97.6°F | Resp 16 | Wt 130.2 lb

## 2018-02-19 DIAGNOSIS — Z79899 Other long term (current) drug therapy: Secondary | ICD-10-CM | POA: Insufficient documentation

## 2018-02-19 DIAGNOSIS — I10 Essential (primary) hypertension: Secondary | ICD-10-CM

## 2018-02-19 DIAGNOSIS — I639 Cerebral infarction, unspecified: Secondary | ICD-10-CM | POA: Diagnosis not present

## 2018-02-19 DIAGNOSIS — N183 Chronic kidney disease, stage 3 unspecified: Secondary | ICD-10-CM | POA: Insufficient documentation

## 2018-02-19 DIAGNOSIS — Z8673 Personal history of transient ischemic attack (TIA), and cerebral infarction without residual deficits: Secondary | ICD-10-CM | POA: Diagnosis not present

## 2018-02-19 DIAGNOSIS — Z7982 Long term (current) use of aspirin: Secondary | ICD-10-CM | POA: Insufficient documentation

## 2018-02-19 DIAGNOSIS — I129 Hypertensive chronic kidney disease with stage 1 through stage 4 chronic kidney disease, or unspecified chronic kidney disease: Secondary | ICD-10-CM | POA: Insufficient documentation

## 2018-02-19 HISTORY — DX: Chronic kidney disease, stage 3 unspecified: N18.30

## 2018-02-19 MED ORDER — ASPIRIN EC 81 MG PO TBEC: 81.0000 mg | DELAYED_RELEASE_TABLET | Freq: Every day | ORAL | 1 refills | Status: DC

## 2018-02-19 MED FILL — CLOPIDOGREL 75 MG TABLET: 75 | 30 days supply | Qty: 30 | Fill #1

## 2018-02-19 MED FILL — ATORVASTATIN CALCIUM 40 MG: 40 | 30 days supply | Qty: 30 | Fill #1

## 2018-02-19 NOTE — Progress Notes (Signed)
Patient ID: Mikayla Reynolds, female    DOB: [birth year]  MRN: [id number]  CC: Establish Care and Hypertension   Subjective: Mikayla Reynolds is a 77 y.o. female who presents for chronic ds management and to establish with me as PCP. GD, Oriah Leinweber, is interpreting. Her concerns today include:  HTTN, CVA, pre-DM, CKD stage 3  Patient seen by a physician assistants last month for hospital follow-up.  Patient was admitted in April with acute ischemic stroke involving the right MCA territory.  Patient discharged on aspirin, Plavix and Lipitor.  She is still taking the Lipitor.  She was to stop the Plavix after 3 weeks.  However she ended up stopping both aspirin and Plavix.  She has a follow-up appointment coming up with neurology. -She feels she is doing well.  Denies any weakness at this time.  No falls.  No problems swallowing. -Good appetite.  She has history of HTN.  She is currently on candesartan 4 mg.  She has medicine with her from [place] that she is still taking.  She is also on low-dose of alprazolam 0.5 mg taking half a tablet at bedtime.  She also has these with her from [place]   Patient Active Problem List   Diagnosis Date Noted  . Stroke ([place]) 12/27/2017  . Elevated troponin 12/27/2017  . Chest pain 12/27/2017  . Essential hypertension      Current Outpatient Medications on File Prior to Visit  Medication Sig Dispense Refill  . atorvastatin (LIPITOR) 40 MG tablet Take 1 tablet (40 mg total) by mouth daily at 6 PM. 30 tablet 3  . Omega-3 Fatty Acids (FISH OIL PO) Take 2 capsules by mouth every evening.    Marland Kitchen PRESCRIPTION MEDICATION Take 20 mg by mouth See admin instructions. "Vastarel" 20 mg -trimetazidine dihydrochloride - from [place] pharmacy/ take one tablet (20 mg) twice daily    . PRESCRIPTION MEDICATION Take 4 mg by mouth See admin instructions. Candesartan 8 mg - from [place] pharmacy - take 1/2 tablet (4 mg) by mouth every morning    . PRESCRIPTION MEDICATION Take 0.25  mg by mouth See admin instructions. Alprazolam 0.5 mg - from [place] pharmacy/ take 1/2 tablet (0.25 mg) by mouth daily at bedtime     No current facility-administered medications on file prior to visit.     No Known Allergies  Social History   Socioeconomic History  . Marital status: Widowed    Spouse name: Not on file  . Number of children: Not on file  . Years of education: Not on file  . Highest education level: Not on file  Occupational History  . Not on file  Social Needs  . Financial resource strain: Not on file  . Food insecurity:    Worry: Not on file    Inability: Not on file  . Transportation needs:    Medical: Not on file    Non-medical: Not on file  Tobacco Use  . Smoking status: Never Smoker  . Smokeless tobacco: Never Used  Substance and Sexual Activity  . Alcohol use: Not on file  . Drug use: Not on file  . Sexual activity: Not on file  Lifestyle  . Physical activity:    Days per week: Not on file    Minutes per session: Not on file  . Stress: Not on file  Relationships  . Social connections:    Talks on phone: Not on file    Gets together: Not on file  Attends religious service: Not on file    Active member of club or organization: Not on file    Attends meetings of clubs or organizations: Not on file    Relationship status: Not on file  . Intimate partner violence:    Fear of current or ex partner: Not on file    Emotionally abused: Not on file    Physically abused: Not on file    Forced sexual activity: Not on file  Other Topics Concern  . Not on file  Social History Narrative  . Not on file    No family history on file.  Past Surgical History:  Procedure Laterality Date  . APPENDECTOMY      ROS: Review of Systems  Respiratory: Negative for shortness of breath.   Cardiovascular: Negative for chest pain.    PHYSICAL EXAM: BP 110/68   Pulse 60   Temp 97.6 F (36.4 C) (Oral)   Resp 16   Wt 130 lb 3.2 oz (59.1 kg)   SpO2 96%    BMI 22.78 kg/m   Physical Exam  General appearance - alert, well appearing, elderly female in  no distress Mental status - normal mood, behavior, speech, she answers questions appropriately. Mouth - mucous membranes moist, pharynx normal without lesions Neck - supple, no significant adenopathy Chest - clear to auscultation, no wheezes, rales or rhonchi, symmetric air entry Heart - normal rate, regular rhythm, normal S1, S2, no murmurs, rubs, clicks or gallops Neurological -cranial nerves appear grossly intact.  She actively moves all 4 extremities.  She is ambulating without assistive device.  Gait is slowed Extremities -no lower extremity edema.this Results for orders placed or performed in visit on 13/24/40  Basic metabolic panel  Result Value Ref Range   Glucose 101 (H) 65 - 99 mg/dL   BUN 22 8 - 27 mg/dL   Creatinine, Ser 0.99 0.57 - 1.00 mg/dL   GFR calc non Af Amer 56 (L) >59 mL/min/1.73   GFR calc Af Amer 64 >59 mL/min/1.73   BUN/Creatinine Ratio 22 12 - 28   Sodium 136 134 - 144 mmol/L   Potassium 4.6 3.5 - 5.2 mmol/L   Chloride 99 96 - 106 mmol/L   CO2 23 20 - 29 mmol/L   Calcium 9.2 8.7 - 10.3 mg/dL   Lab Results  Component Value Date   WBC 5.3 12/28/2017   HGB 12.2 12/28/2017   HCT 37.1 12/28/2017   MCV 95.1 12/28/2017   PLT 159 12/28/2017      ASSESSMENT AND PLAN: 1. Essential hypertension She will continue the candesartan  2. Cerebrovascular accident (CVA), unspecified mechanism ([place]) Advised to restart aspirin.  Continue Lipitor.  Keep appointment with neurology. - aspirin EC 81 MG tablet; Take 1 tablet (81 mg total) by mouth daily.  Dispense: 100 tablet; Refill: 1  3. CKD (chronic kidney disease), stage III ([facility]) Stable.  We will continue to observe  Patient was given the opportunity to ask questions.  Patient verbalized understanding of the plan and was able to repeat key elements of the plan.   No orders of the defined types were placed in this  encounter.    Requested Prescriptions   Signed Prescriptions Disp Refills  . aspirin EC 81 MG tablet 100 tablet 1    Sig: Take 1 tablet (81 mg total) by mouth daily.    Return in about 3 months (around 05/22/2018).  Karle Plumber, MD, FACP

## 2018-03-13 MED FILL — CLOPIDOGREL 75 MG TABLET: 75 | 30 days supply | Qty: 30 | Fill #2

## 2018-03-13 MED FILL — ATORVASTATIN CALCIUM 40 MG: 40 | 30 days supply | Qty: 30 | Fill #2

## 2018-03-17 ENCOUNTER — Telehealth: Payer: Self-pay

## 2018-03-17 ENCOUNTER — Ambulatory Visit: Payer: MEDICAID | Admitting: Neurology

## 2018-03-17 ENCOUNTER — Encounter: Payer: Self-pay | Admitting: Neurology

## 2018-03-17 VITALS — BP 116/70 | HR 60 | Ht 63.0 in | Wt 128.0 lb

## 2018-03-17 DIAGNOSIS — I639 Cerebral infarction, unspecified: Secondary | ICD-10-CM

## 2018-03-17 NOTE — Patient Instructions (Signed)
I had a long d/w patient and her grand daughter about her recent cryptogenic stroke, risk for recurrent stroke/TIAs, personally independently reviewed imaging studies and stroke evaluation results and answered questions.Continue aspirin 81 mg daily  for secondary stroke prevention  but discontinued Plavix and maintain strict control of hypertension with blood pressure goal below 130/90, diabetes with hemoglobin A1c goal below 6.5% and lipids with LDL cholesterol goal below 70 mg/dL. I also advised the patient to eat a healthy diet with plenty of whole grains, cereals, fruits and vegetables, exercise regularly and maintain ideal body weight. I advised the patient to do regular left hand exercises to improve fine motor skills. Check outpatient TEE and loop recorder for cryptogenic stroke. Followup in the future with my nurse practitioner Janett Billow in 3 months or call earlier if necessary.  Stroke Prevention Some medical conditions and behaviors are associated with a higher chance of having a stroke. You can help prevent a stroke by making nutrition, lifestyle, and other changes, including managing any medical conditions you may have. What nutrition changes can be made?  Eat healthy foods. You can do this by: ? Choosing foods high in fiber, such as fresh fruits and vegetables and whole grains. ? Eating at least 5 or more servings of fruits and vegetables a day. Try to fill half of your plate at each meal with fruits and vegetables. ? Choosing lean protein foods, such as lean cuts of meat, poultry without skin, fish, tofu, beans, and nuts. ? Eating low-fat dairy products. ? Avoiding foods that are high in salt (sodium). This can help lower blood pressure. ? Avoiding foods that have saturated fat, trans fat, and cholesterol. This can help prevent high cholesterol. ? Avoiding processed and premade foods.  Follow your health care provider's specific guidelines for losing weight, controlling high blood pressure  (hypertension), lowering high cholesterol, and managing diabetes. These may include: ? Reducing your daily calorie intake. ? Limiting your daily sodium intake to 1,500 milligrams (mg). ? Using only healthy fats for cooking, such as olive oil, canola oil, or sunflower oil. ? Counting your daily carbohydrate intake. What lifestyle changes can be made?  Maintain a healthy weight. Talk to your health care provider about your ideal weight.  Get at least 30 minutes of moderate physical activity at least 5 days a week. Moderate activity includes brisk walking, biking, and swimming.  Do not use any products that contain nicotine or tobacco, such as cigarettes and e-cigarettes. If you need help quitting, ask your health care provider. It may also be helpful to avoid exposure to secondhand smoke.  Limit alcohol intake to no more than 1 drink a day for nonpregnant women and 2 drinks a day for men. One drink equals 12 oz of beer, 5 oz of wine, or 1 oz of hard liquor.  Stop any illegal drug use.  Avoid taking birth control pills. Talk to your health care provider about the risks of taking birth control pills if: ? You are over 75 years old. ? You smoke. ? You get migraines. ? You have ever had a blood clot. What other changes can be made?  Manage your cholesterol levels. ? Eating a healthy diet is important for preventing high cholesterol. If cholesterol cannot be managed through diet alone, you may also need to take medicines. ? Take any prescribed medicines to control your cholesterol as told by your health care provider.  Manage your diabetes. ? Eating a healthy diet and exercising regularly are important parts  of managing your blood sugar. If your blood sugar cannot be managed through diet and exercise, you may need to take medicines. ? Take any prescribed medicines to control your diabetes as told by your health care provider.  Control your hypertension. ? To reduce your risk of stroke, try  to keep your blood pressure below 130/80. ? Eating a healthy diet and exercising regularly are an important part of controlling your blood pressure. If your blood pressure cannot be managed through diet and exercise, you may need to take medicines. ? Take any prescribed medicines to control hypertension as told by your health care provider. ? Ask your health care provider if you should monitor your blood pressure at home. ? Have your blood pressure checked every year, even if your blood pressure is normal. Blood pressure increases with age and some medical conditions.  Get evaluated for sleep disorders (sleep apnea). Talk to your health care provider about getting a sleep evaluation if you snore a lot or have excessive sleepiness.  Take over-the-counter and prescription medicines only as told by your health care provider. Aspirin or blood thinners (antiplatelets or anticoagulants) may be recommended to reduce your risk of forming blood clots that can lead to stroke.  Make sure that any other medical conditions you have, such as atrial fibrillation or atherosclerosis, are managed. What are the warning signs of a stroke? The warning signs of a stroke can be easily remembered as BEFAST.  B is for balance. Signs include: ? Dizziness. ? Loss of balance or coordination. ? Sudden trouble walking.  E is for eyes. Signs include: ? A sudden change in vision. ? Trouble seeing.  F is for face. Signs include: ? Sudden weakness or numbness of the face. ? The face or eyelid drooping to one side.  A is for arms. Signs include: ? Sudden weakness or numbness of the arm, usually on one side of the body.  S is for speech. Signs include: ? Trouble speaking (aphasia). ? Trouble understanding.  T is for time. ? These symptoms may represent a serious problem that is an emergency. Do not wait to see if the symptoms will go away. Get medical help right away. Call your local emergency services (911 in the  [place] Do not drive yourself to the hospital.  Other signs of stroke may include: ? A sudden, severe headache with no known cause. ? Nausea or vomiting. ? Seizure.  Where to find more information: For more information, visit:  American Stroke Association: www.strokeassociation.org  National Stroke Association: www.stroke.org  Summary  You can prevent a stroke by eating healthy, exercising, not smoking, limiting alcohol intake, and managing any medical conditions you may have.  Do not use any products that contain nicotine or tobacco, such as cigarettes and e-cigarettes. If you need help quitting, ask your health care provider. It may also be helpful to avoid exposure to secondhand smoke.  Remember BEFAST for warning signs of stroke. Get help right away if you or a loved one has any of these signs. This information is not intended to replace advice given to you by your health care provider. Make sure you discuss any questions you have with your health care provider. Document Released: 09/26/2004 Document Revised: 09/24/2016 Document Reviewed: 09/24/2016 Elsevier Interactive Patient Education  [employer].

## 2018-03-17 NOTE — Telephone Encounter (Signed)
Waiver for interpreter services done just for today visit. Daughter and patient will use free interpretation services for future visit via [facility].

## 2018-03-17 NOTE — Progress Notes (Signed)
[facility] [address] [place] [address] [phone]       OFFICE CONSULT NOTE  Mikayla. Mikayla Reynolds Date of Birth:  [birth year] Medical Record Number:  [id number]   Referring MD:  Merlene Laughter  Reason for Referral:  stroke HPI: Mikayla Reynolds is a pleasant 77 year old [place] lady who seen today for first office follow-up visit for hospital admission for stroke in April 2019. She is a accompanied by her granddaughter who translates for her as patient speaks no [place]. History is obtained from them as well as review of electronic medical records. I have personally reviewed imaging for lemons in hospital stroke workup.HPI:( Dr Lorraine Lax)  Mikayla Reynolds is an 77 y.o. [place] female with a past medical history of 2 previous TIAs, hypertension who presents to the ED with a 2-day complaint of left sided hemiparesis, left paresthesia, dysphagia, dysarthria left facial droop and impaired mobility.  Patient is [place] speaking and she refused for iPad [place] interpreter, and opted for granddaughter at the bedside to interpret for her.Per family, patient's son last saw her at 1 PM on Wednesday, 12/25/2017, he went out and returned around 8 PM.  At that time patient was confused, was speaking gibberish, with slurred speech, left-sided facial droop, unsteady gait but was still walking around as well as left sided weakness.  At baseline patient is left-handed, when she was eating dinner she was unable to hold the spoon in her left hand and had drooling of the fluid out of the left side of her mouth with some difficulty swallowing.  Patient refused to come to the ER per family but they just had to bring her in today due to the persistency of her symptoms. Per patient through interpretation by her granddaughter, she had a headache starting late Thursday, which continues to be persistent up until now, difficulty sleeping, blurred vision which has since resolved, denies trauma or falls. Per  family patient has had 2 previous TIAs, first 1 in [place] 2 years ago with the exact same symptoms which resolved spontaneously, second one was about 9 months ago with the exact same symptoms, patient refused to go to the hospital at that time and by the time she had woken up the next morning symptoms had resolved. On admission to the ER patient is very compliant, does answer questions appropriately, per family continues to have slurred speech, but more lucid with more coherent speech.  Initial CT of the head done in the ER showed acute right MCA distribution infarct.  Neuro was consulted for further evaluation.  Patient denies dizziness, nausea, vomiting she admits to intermittent chest pain and family says has some anxiety.  Troponin on admission was elevated at 0.1, EKG was normal sinus rhythm no ST-t wave changes consistent with ischemia, chest x-ray pending, normal electrolytes within normal limits. 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 MRI scan confirmed a right frontal MCA branch infarct. Carotid ultrasound was not done. LDL cholesterol was elevated 120 mg percent. In women A1c was 6. Transthoracic echo showed normal ejection fraction. Telemetric cardiac monitoring did not reveal any paroxysmal A. fib. Patient was started on aspirin for stroke prevention. Physical occupational therapy were consulted. Patient did well and was discharged home. She has finished home therapies. Her speech and swallowing have improved. She still has some diminished fine motor skills in the left hand. She still  drops objects from hand. She is tolerating aspirin well without bleeding or bruising. She is also on Plavix. Transesophageal echocardiogram and prolonged cardiac monitoring for some reason the not ordered. Patient has no new complaints.    ROS:   14 system review of systems is  positive for  anxiety, memory loss, disinterest in activities, hand weakness and all systems negative  PMH:  Past Medical History:  Diagnosis Date  . Pre-diabetes   . Stroke ([place])   . TIA (transient ischemic attack)     Social History:  Social History   Socioeconomic History  . Marital status: Widowed    Spouse name: Not on file  . Number of children: Not on file  . Years of education: Not on file  . Highest education level: Not on file  Occupational History  . Not on file  Social Needs  . Financial resource strain: Not on file  . Food insecurity:    Worry: Not on file    Inability: Not on file  . Transportation needs:    Medical: Not on file    Non-medical: Not on file  Tobacco Use  . Smoking status: Never Smoker  . Smokeless tobacco: Never Used  Substance and Sexual Activity  . Alcohol use: Not Currently  . Drug use: Not Currently  . Sexual activity: Not on file  Lifestyle  . Physical activity:    Days per week: Not on file    Minutes per session: Not on file  . Stress: Not on file  Relationships  . Social connections:    Talks on phone: Not on file    Gets together: Not on file    Attends religious service: Not on file    Active member of club or organization: Not on file    Attends meetings of clubs or organizations: Not on file    Relationship status: Not on file  . Intimate partner violence:    Fear of current or ex partner: Not on file    Emotionally abused: Not on file    Physically abused: Not on file    Forced sexual activity: Not on file  Other Topics Concern  . Not on file  Social History Narrative  . Not on file    Medications:   Current Outpatient Medications on File Prior to Visit  Medication Sig Dispense Refill  . aspirin EC 81 MG tablet Take 1 tablet (81 mg total) by mouth daily. 100 tablet 1  . atorvastatin (LIPITOR) 40 MG tablet Take 1 tablet (40 mg total) by mouth daily at 6 PM. 30 tablet 3  . PRESCRIPTION MEDICATION Take 20 mg by  mouth See admin instructions. "Vastarel" 20 mg -trimetazidine dihydrochloride - from [place] pharmacy/ take one tablet (20 mg) twice daily    . PRESCRIPTION MEDICATION Take 4 mg by mouth See admin instructions. Candesartan 8 mg - from [place] pharmacy - take 1/2 tablet (4 mg) by mouth every morning    . PRESCRIPTION MEDICATION Take 0.25 mg by mouth See admin instructions. Alprazolam 0.5 mg - from [place] pharmacy/ take 1/2 tablet (0.25 mg) by mouth daily at bedtime     No current facility-administered medications on file prior to visit.     Allergies:  No Known Allergies  Physical Exam General: Frail elderly Asian lady seated, in no evident distress Head: head normocephalic and atraumatic.   Neck: supple with no carotid or supraclavicular bruits Cardiovascular: regular rate and rhythm, no murmurs Musculoskeletal: no deformity Skin:  no rash/petichiae  Vascular:  Normal pulses all extremities  Neurologic Exam Mental Status: Awake and fully alert. Oriented to place and time. Recent and remote memory intact. Attention span, concentration and fund of knowledge appropriate. Mood and affect appropriate.  Cranial Nerves: Fundoscopic exam reveals sharp disc margins. Pupils equal, briskly reactive to light. Extraocular movements full without nystagmus. Visual fields full to confrontation. Hearing intact. Facial sensation intact. Mild left lower facial asymmetry., tongue, palate moves normally and symmetrically.  Motor: Normal bulk and tone. Normal strength in all tested extremity muscles. Mild weakness of left grip and intrinsic hand muscles. Left index has trigger finger orbits right over left upper extremity. Sensory.: intact to touch , pinprick , position and vibratory sensation.  Coordination: Rapid alternating movements normal in all extremities. Finger-to-nose and heel-to-shin performed accurately bilaterally. Gait and Station: Arises from chair without difficulty. Stance is normal. Gait  demonstrates normal stride length and balance . Able to heel, toe and tandem walk without difficulty.  Reflexes: 1+ and symmetric. Toes downgoing.   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 Vascular risk factors of hyperlipidemia and age only    PLAN: I had a long d/w patient and her grand daughter about her recent cryptogenic stroke, risk for recurrent stroke/TIAs, personally independently reviewed imaging studies and stroke evaluation results and answered questions.Continue aspirin 81 mg daily  for secondary stroke prevention  but discontinued Plavix and maintain strict control of hypertension with blood pressure goal below 130/90, diabetes with hemoglobin A1c goal below 6.5% and lipids with LDL cholesterol goal below 70 mg/dL. I also advised the patient to eat a healthy diet with plenty of whole grains, cereals, fruits and vegetables, exercise regularly and maintain ideal body weight. I advised the patient to do regular left hand exercises to improve fine motor skills. Check outpatient TEE and loop recorder for cryptogenic stroke. Followup in the future with my nurse practitioner Janett Billow in 3 months or call earlier if necessary. Greater than 50% time during this 45 minute consultation visit was spent on counseling and coordination of care about a cryptogenic stroke and discussion about evaluation and treatment and stroke prevention. Antony Contras, MD  [facility] [address] [place], [place] [address]  Phone [phone] Fax [phone] Note: This document was prepared with digital dictation and possible smart phrase technology. Any transcriptional errors that result from this process are unintentional.

## 2018-04-02 ENCOUNTER — Ambulatory Visit (INDEPENDENT_AMBULATORY_CARE_PROVIDER_SITE_OTHER): Payer: Medicaid Other

## 2018-04-02 ENCOUNTER — Other Ambulatory Visit: Payer: Self-pay | Admitting: Neurology

## 2018-04-02 ENCOUNTER — Encounter (INDEPENDENT_AMBULATORY_CARE_PROVIDER_SITE_OTHER): Payer: Self-pay

## 2018-04-02 DIAGNOSIS — I4891 Unspecified atrial fibrillation: Secondary | ICD-10-CM

## 2018-04-02 DIAGNOSIS — I639 Cerebral infarction, unspecified: Secondary | ICD-10-CM

## 2018-04-15 MED FILL — ATORVASTATIN CALCIUM 40 MG: 40 | 30 days supply | Qty: 30 | Fill #3

## 2018-05-22 ENCOUNTER — Ambulatory Visit: Payer: Medicaid Other | Attending: Internal Medicine | Admitting: Internal Medicine

## 2018-05-22 ENCOUNTER — Encounter: Payer: Self-pay | Admitting: Internal Medicine

## 2018-05-22 VITALS — BP 130/68 | HR 68 | Temp 98.2°F | Resp 16 | Wt 133.0 lb

## 2018-05-22 DIAGNOSIS — R079 Chest pain, unspecified: Secondary | ICD-10-CM | POA: Insufficient documentation

## 2018-05-22 DIAGNOSIS — K5900 Constipation, unspecified: Secondary | ICD-10-CM

## 2018-05-22 DIAGNOSIS — I129 Hypertensive chronic kidney disease with stage 1 through stage 4 chronic kidney disease, or unspecified chronic kidney disease: Secondary | ICD-10-CM | POA: Insufficient documentation

## 2018-05-22 DIAGNOSIS — N183 Chronic kidney disease, stage 3 (moderate): Secondary | ICD-10-CM | POA: Insufficient documentation

## 2018-05-22 DIAGNOSIS — Z8673 Personal history of transient ischemic attack (TIA), and cerebral infarction without residual deficits: Secondary | ICD-10-CM | POA: Diagnosis not present

## 2018-05-22 DIAGNOSIS — R7303 Prediabetes: Secondary | ICD-10-CM

## 2018-05-22 DIAGNOSIS — I639 Cerebral infarction, unspecified: Secondary | ICD-10-CM

## 2018-05-22 DIAGNOSIS — Z23 Encounter for immunization: Secondary | ICD-10-CM | POA: Insufficient documentation

## 2018-05-22 DIAGNOSIS — Z79899 Other long term (current) drug therapy: Secondary | ICD-10-CM | POA: Diagnosis not present

## 2018-05-22 DIAGNOSIS — I10 Essential (primary) hypertension: Secondary | ICD-10-CM | POA: Diagnosis not present

## 2018-05-22 DIAGNOSIS — Z7982 Long term (current) use of aspirin: Secondary | ICD-10-CM | POA: Insufficient documentation

## 2018-05-22 DIAGNOSIS — E1122 Type 2 diabetes mellitus with diabetic chronic kidney disease: Secondary | ICD-10-CM | POA: Diagnosis not present

## 2018-05-22 LAB — POCT GLYCOSYLATED HEMOGLOBIN (HGB A1C): HbA1c, POC (prediabetic range): 5.7 % (ref 5.7–6.4)

## 2018-05-22 MED ORDER — ATORVASTATIN CALCIUM 40 MG PO TABS: 40.0000 mg | ORAL_TABLET | Freq: Every day | ORAL | 11 refills | Status: DC

## 2018-05-22 MED ORDER — POLYETHYLENE GLYCOL 3350 17 GM/SCOOP PO POWD: 17.0000 g | ORAL | 1 refills | Status: DC

## 2018-05-22 MED FILL — ATORVASTATIN 40 MG TABLET: 40 | 30 days supply | Qty: 30 | Fill #0

## 2018-05-22 MED FILL — POLYETHYLENE GLYCOL 3350 PO: 30 days supply | Qty: 238 | Fill #0

## 2018-05-22 NOTE — Progress Notes (Signed)
Patient ID: Mikayla Reynolds, female    DOB: 1941/05/23  MRN: [id number]  CC: Hypertension   Subjective: Mikayla Reynolds is a 77 y.o. female who presents for chronic disease management.  GD, Mikayla Reynolds, is interpreting. Her concerns today include:  HTN, CVA RT MCA, pre-DM, CKD stage 3  CVA: saw neurologist, Dr. Leonie Man since last visit.  Pt taking ASA and Lipitor as prescribed.  She is also still taking candesartan 8 mg half a tablet daily.  She states that she still has quite a supply with her from [place] and does not need refill at this time.   C/o constipation x 1 mth. Use to have a BM every morning but now goes once every 2 days and stools are a little hard.  No blood in stools.  Eats veggies and drinks several bottles of water ever day.     Patient Active Problem List   Diagnosis Date Noted  . CKD (chronic kidney disease), stage III ([place]) 02/19/2018  . Stroke ([place]) 12/27/2017  . Elevated troponin 12/27/2017  . Chest pain 12/27/2017  . Essential hypertension      Current Outpatient Medications on File Prior to Visit  Medication Sig Dispense Refill  . aspirin EC 81 MG tablet Take 1 tablet (81 mg total) by mouth daily. 100 tablet 1  . PRESCRIPTION MEDICATION Take 20 mg by mouth See admin instructions. "Vastarel" 20 mg -trimetazidine dihydrochloride - from [place] pharmacy/ take one tablet (20 mg) twice daily    . PRESCRIPTION MEDICATION Take 4 mg by mouth See admin instructions. Candesartan 8 mg - from [place] pharmacy - take 1/2 tablet (4 mg) by mouth every morning    . PRESCRIPTION MEDICATION Take 0.25 mg by mouth See admin instructions. Alprazolam 0.5 mg - from [place] pharmacy/ take 1/2 tablet (0.25 mg) by mouth daily at bedtime     No current facility-administered medications on file prior to visit.     No Known Allergies  Social History   Socioeconomic History  . Marital status: Widowed    Spouse name: Not on file  . Number of children: Not on file  . Years of  education: Not on file  . Highest education level: Not on file  Occupational History  . Not on file  Social Needs  . Financial resource strain: Not on file  . Food insecurity:    Worry: Not on file    Inability: Not on file  . Transportation needs:    Medical: Not on file    Non-medical: Not on file  Tobacco Use  . Smoking status: Never Smoker  . Smokeless tobacco: Never Used  Substance and Sexual Activity  . Alcohol use: Not Currently  . Drug use: Not Currently  . Sexual activity: Not on file  Lifestyle  . Physical activity:    Days per week: Not on file    Minutes per session: Not on file  . Stress: Not on file  Relationships  . Social connections:    Talks on phone: Not on file    Gets together: Not on file    Attends religious service: Not on file    Active member of club or organization: Not on file    Attends meetings of clubs or organizations: Not on file    Relationship status: Not on file  . Intimate partner violence:    Fear of current or ex partner: Not on file    Emotionally abused: Not on file    Physically abused:  Not on file    Forced sexual activity: Not on file  Other Topics Concern  . Not on file  Social History Narrative  . Not on file    No family history on file.  Past Surgical History:  Procedure Laterality Date  . APPENDECTOMY      ROS: Review of Systems Negative except as above PHYSICAL EXAM: BP 130/68   Pulse 68   Temp 98.2 F (36.8 C) (Oral)   Resp 16   Wt 133 lb (60.3 kg)   SpO2 96%   BMI 23.56 kg/m   Physical Exam  General appearance - alert, well appearing, elderly female in NAD  Mental status - normal mood, behavior, speech, dress, motor activity, and thought processes Mouth - mucous membranes moist, pharynx normal without lesions Chest - clear to auscultation, no wheezes, rales or rhonchi, symmetric air entry Heart - normal rate, regular rhythm, normal S1, S2, no murmurs, rubs, clicks or gallops Abdomen - soft,  nontender, nondistended, no masses or organomegaly Extremities - peripheral pulses normal, no pedal edema, no clubbing or cyanosis  Results for orders placed or performed in visit on 05/22/18  POCT glycosylated hemoglobin (Hb A1C)  Result Value Ref Range   Hemoglobin A1C     HbA1c POC (<> result, manual entry)     HbA1c, POC (prediabetic range) 5.7 5.7 - 6.4 %   HbA1c, POC (controlled diabetic range)      ASSESSMENT AND PLAN: 1. Essential hypertension At goal.  Continue current medication and low-salt diet.  2. Cerebrovascular accident (CVA), unspecified mechanism ([place]) Blood pressure at goal.  Continue aspirin and Lipitor. - atorvastatin (LIPITOR) 40 MG tablet; Take 1 tablet (40 mg total) by mouth daily at 6 PM.  Dispense: 30 tablet; Refill: 11  3. Constipation, unspecified constipation type Encourage increased fiber in the diet.  Prescription given for MiraLAX to use 3 times a week as needed. - polyethylene glycol powder (GLYCOLAX/MIRALAX) powder; Take 17 g by mouth 3 (three) times a week.  Dispense: 3350 g; Refill: 1  4. Prediabetes - POCT glycosylated hemoglobin (Hb A1C)  5. Need for immunization against influenza - Flu Vaccine QUAD 36+ mos IM  6. Need for Tdap vaccination Given.  7. Need for vaccination against Streptococcus pneumoniae using pneumococcal conjugate vaccine 13 Given.   Patient was given the opportunity to ask questions.  Patient verbalized understanding of the plan and was able to repeat key elements of the plan.   Orders Placed This Encounter  Procedures  . Tdap vaccine greater than or equal to 7yo IM  . Pneumococcal conjugate vaccine 13-valent  . Flu Vaccine QUAD 36+ mos IM  . POCT glycosylated hemoglobin (Hb A1C)     Requested Prescriptions   Signed Prescriptions Disp Refills  . atorvastatin (LIPITOR) 40 MG tablet 30 tablet 11    Sig: Take 1 tablet (40 mg total) by mouth daily at 6 PM.  . polyethylene glycol powder (GLYCOLAX/MIRALAX) powder  3350 g 1    Sig: Take 17 g by mouth 3 (three) times a week.    Return in about 4 months (around 09/21/2018).  Karle Plumber, MD, FACP

## 2018-05-22 NOTE — Patient Instructions (Signed)
Td Vaccine (Tetanus and Diphtheria): What You Need to Know 1. Why get vaccinated? Tetanus  and diphtheria are very serious diseases. They are rare in the [place] today, but people who do become infected often have severe complications. Td vaccine is used to protect adolescents and adults from both of these diseases. Both tetanus and diphtheria are infections caused by bacteria. Diphtheria spreads from person to person through coughing or sneezing. Tetanus-causing bacteria enter the body through cuts, scratches, or wounds. TETANUS (lockjaw) causes painful muscle tightening and stiffness, usually all over the body.  It can lead to tightening of muscles in the head and neck so you can't open your mouth, swallow, or sometimes even breathe. Tetanus kills about 1 out of every 10 people who are infected even after receiving the best medical care.  DIPHTHERIA can cause a thick coating to form in the back of the throat.  It can lead to breathing problems, paralysis, heart failure, and death.  Before vaccines, as many as 200,000 cases of diphtheria and hundreds of cases of tetanus were reported in the [place] each year. Since vaccination began, reports of cases for both diseases have dropped by about 99%. 2. Td vaccine Td vaccine can protect adolescents and adults from tetanus and diphtheria. Td is usually given as a booster dose every 10 years but it can also be given earlier after a severe and dirty wound or burn. Another vaccine, called Tdap, which protects against pertussis in addition to tetanus and diphtheria, is sometimes recommended instead of Td vaccine. Your doctor or the person giving you the vaccine can give you more information. Td may safely be given at the same time as other vaccines. 3. Some people should not get this vaccine  A person who has ever had a life-threatening allergic reaction after a previous dose of any tetanus or diphtheria containing vaccine, OR has a severe  allergy to any part of this vaccine, should not get Td vaccine. Tell the person giving the vaccine about any severe allergies.  Talk to your doctor if you: ? had severe pain or swelling after any vaccine containing diphtheria or tetanus, ? ever had a condition called Guillain Barre Syndrome (GBS), ? aren't feeling well on the day the shot is scheduled. 4. What are the risks from Td vaccine? With any medicine, including vaccines, there is a chance of side effects. These are usually mild and go away on their own. Serious reactions are also possible but are rare. Most people who get Td vaccine do not have any problems with it. Mild problems following Td vaccine: (Did not interfere with activities)  Pain where the shot was given (about 8 people in 10)  Redness or swelling where the shot was given (about 1 person in 4)  Mild fever (rare)  Headache (about 1 person in 4)  Tiredness (about 1 person in 4)  Moderate problems following Td vaccine: (Interfered with activities, but did not require medical attention)  Fever over 102F (rare)  Severe problems following Td vaccine: (Unable to perform usual activities; required medical attention)  Swelling, severe pain, bleeding and/or redness in the arm where the shot was given (rare).  Problems that could happen after any vaccine:  People sometimes faint after a medical procedure, including vaccination. Sitting or lying down for about 15 minutes can help prevent fainting, and injuries caused by a fall. Tell your doctor if you feel dizzy, or have vision changes or ringing in the ears.  Some people get   severe pain in the shoulder and have difficulty moving the arm where a shot was given. This happens very rarely.  Any medication can cause a severe allergic reaction. Such reactions from a vaccine are very rare, estimated at fewer than 1 in a million doses, and would happen within a few minutes to a few hours after the vaccination. As with any  medicine, there is a very remote chance of a vaccine causing a serious injury or death. The safety of vaccines is always being monitored. For more information, visit: http://www.aguilar.org/ 5. What if there is a serious reaction? What should I look for? Look for anything that concerns you, such as signs of a severe allergic reaction, very high fever, or unusual behavior. Signs of a severe allergic reaction can include hives, swelling of the face and throat, difficulty breathing, a fast heartbeat, dizziness, and weakness. These would usually start a few minutes to a few hours after the vaccination. What should I do?  If you think it is a severe allergic reaction or other emergency that can't wait, call 9-1-1 or get the person to the nearest hospital. Otherwise, call your doctor.  Afterward, the reaction should be reported to the Vaccine Adverse Event Reporting System (VAERS). Your doctor might file this report, or you can do it yourself through the VAERS web site at www.vaers.SamedayNews.es, or by calling [phone]. ? VAERS does not give medical advice. 6. The National Vaccine Injury Compensation Program The [employer] Vaccine Injury Compensation Program (VICP) is a federal program that was created to compensate people who may have been injured by certain vaccines. Persons who believe they may have been injured by a vaccine can learn about the program and about filing a claim by calling 450 417 4321 or visiting the [place] website at GoldCloset.com.ee. There is a time limit to file a claim for compensation. 7. How can I learn more?  Ask your doctor. He or she can give you the vaccine package insert or suggest other sources of information.  Call your local or state health department.  Contact the Centers for Disease Control and Prevention (CDC): ? Call 8058385471 (1-800-CDC-INFO) ? Visit CDC's website at http://hunter.com/ CDC Td Vaccine VIS (12/12/15) This information is  not intended to replace advice given to you by your health care provider. Make sure you discuss any questions you have with your health care provider. Document Released: 06/16/2006 Document Revised: 05/09/2016 Document Reviewed: 05/09/2016 Elsevier Interactive Patient Education  2017 Elsevier Inc. Pneumococcal Conjugate Vaccine (PCV13) What You Need to Know 1. Why get vaccinated? Vaccination can protect both children and adults from pneumococcal disease. Pneumococcal disease is caused by bacteria that can spread from person to person through close contact. It can cause ear infections, and it can also lead to more serious infections of the:  Lungs (pneumonia),  Blood (bacteremia), and  Covering of the brain and spinal cord (meningitis).  Pneumococcal pneumonia is most common among adults. Pneumococcal meningitis can cause deafness and brain damage, and it kills about 1 child in 10 who get it. Anyone can get pneumococcal disease, but children under 60 years of age and adults 74 years and older, people with certain medical conditions, and cigarette smokers are at the highest risk. Before there was a vaccine, the [place] saw:  more than 700 cases of meningitis,  about 13,000 blood infections,  about 5 million ear infections, and  about 200 deaths  in children under 5 each year from pneumococcal disease. Since vaccine became available, severe pneumococcal disease  in these children has fallen by 88%. About 18,000 older adults die of pneumococcal disease each year in the [place]. Treatment of pneumococcal infections with penicillin and other drugs is not as effective as it used to be, because some strains of the disease have become resistant to these drugs. This makes prevention of the disease, through vaccination, even more important. 2. PCV13 vaccine Pneumococcal conjugate vaccine (called PCV13) protects against 13 types of pneumococcal bacteria. PCV13 is routinely given to  children at 2, 4, 6, and 64-73 months of age. It is also recommended for children and adults 60 to 34 years of age with certain health conditions, and for all adults 45 years of age and older. Your doctor can give you details. 3. Some people should not get this vaccine Anyone who has ever had a life-threatening allergic reaction to a dose of this vaccine, to an earlier pneumococcal vaccine called PCV7, or to any vaccine containing diphtheria toxoid (for example, DTaP), should not get PCV13. Anyone with a severe allergy to any component of PCV13 should not get the vaccine. Tell your doctor if the person being vaccinated has any severe allergies. If the person scheduled for vaccination is not feeling well, your healthcare provider might decide to reschedule the shot on another day. 4. Risks of a vaccine reaction With any medicine, including vaccines, there is a chance of reactions. These are usually mild and go away on their own, but serious reactions are also possible. Problems reported following PCV13 varied by age and dose in the series. The most common problems reported among children were:  About half became drowsy after the shot, had a temporary loss of appetite, or had redness or tenderness where the shot was given.  About 1 out of 3 had swelling where the shot was given.  About 1 out of 3 had a mild fever, and about 1 in 20 had a fever over 102.35F.  Up to about 8 out of 10 became fussy or irritable.  Adults have reported pain, redness, and swelling where the shot was given; also mild fever, fatigue, headache, chills, or muscle pain. Young children who get PCV13 along with inactivated flu vaccine at the same time may be at increased risk for seizures caused by fever. Ask your doctor for more information. Problems that could happen after any vaccine:  People sometimes faint after a medical procedure, including vaccination. Sitting or lying down for about 15 minutes can help prevent fainting,  and injuries caused by a fall. Tell your doctor if you feel dizzy, or have vision changes or ringing in the ears.  Some older children and adults get severe pain in the shoulder and have difficulty moving the arm where a shot was given. This happens very rarely.  Any medication can cause a severe allergic reaction. Such reactions from a vaccine are very rare, estimated at about 1 in a million doses, and would happen within a few minutes to a few hours after the vaccination. As with any medicine, there is a very small chance of a vaccine causing a serious injury or death. The safety of vaccines is always being monitored. For more information, visit: http://www.aguilar.org/ 5. What if there is a serious reaction? What should I look for? Look for anything that concerns you, such as signs of a severe allergic reaction, very high fever, or unusual behavior. Signs of a severe allergic reaction can include hives, swelling of the face and throat, difficulty breathing, a fast heartbeat, dizziness, and weakness-usually  within a few minutes to a few hours after the vaccination. What should I do?  If you think it is a severe allergic reaction or other emergency that can't wait, call 9-1-1 or get the person to the nearest hospital. Otherwise, call your doctor.  Reactions should be reported to the Vaccine Adverse Event Reporting System (VAERS). Your doctor should file this report, or you can do it yourself through the VAERS web site at www.vaers.SamedayNews.es, or by calling 724-312-1305. ? VAERS does not give medical advice. 6. The National Vaccine Injury Compensation Program The [employer] Vaccine Injury Compensation Program (VICP) is a federal program that was created to compensate people who may have been injured by certain vaccines. Persons who believe they may have been injured by a vaccine can learn about the program and about filing a claim by calling (641)062-4594 or visiting the [place] website at  GoldCloset.com.ee. There is a time limit to file a claim for compensation. 7. How can I learn more?  Ask your healthcare provider. He or she can give you the vaccine package insert or suggest other sources of information.  Call your local or state health department.  Contact the Centers for Disease Control and Prevention (CDC): ? Call 401-259-5332 (1-800-CDC-INFO) or ? Visit CDC's website at http://hunter.com/ Vaccine Information Statement, PCV13 Vaccine (07/07/2014) This information is not intended to replace advice given to you by your health care provider. Make sure you discuss any questions you have with your health care provider. Document Released: 06/16/2006 Document Revised: 05/09/2016 Document Reviewed: 05/09/2016 Elsevier Interactive Patient Education  2017 [employer]. Influenza Virus Vaccine injection (Fluarix) What is this medicine? INFLUENZA VIRUS VACCINE (in floo EN zuh VAHY ruhs vak SEEN) helps to reduce the risk of getting influenza also known as the flu. This medicine may be used for other purposes; ask your health care provider or pharmacist if you have questions. COMMON BRAND NAME(S): Fluarix, Fluzone What should I tell my health care provider before I take this medicine? They need to know if you have any of these conditions: -bleeding disorder like hemophilia -fever or infection -Guillain-Barre syndrome or other neurological problems -immune system problems -infection with the human immunodeficiency virus (HIV) or AIDS -low blood platelet counts -multiple sclerosis -an unusual or allergic reaction to influenza virus vaccine, eggs, chicken proteins, latex, gentamicin, other medicines, foods, dyes or preservatives -pregnant or trying to get pregnant -breast-feeding How should I use this medicine? This vaccine is for injection into a muscle. It is given by a health care professional. A copy of Vaccine Information Statements will be given before  each vaccination. Read this sheet carefully each time. The sheet may change frequently. Talk to your pediatrician regarding the use of this medicine in children. Special care may be needed. Overdosage: If you think you have taken too much of this medicine contact a poison control center or emergency room at once. NOTE: This medicine is only for you. Do not share this medicine with others. What if I miss a dose? This does not apply. What may interact with this medicine? -chemotherapy or radiation therapy -medicines that lower your immune system like etanercept, anakinra, infliximab, and adalimumab -medicines that treat or prevent blood clots like warfarin -phenytoin -steroid medicines like prednisone or cortisone -theophylline -vaccines This list may not describe all possible interactions. Give your health care provider a list of all the medicines, herbs, non-prescription drugs, or dietary supplements you use. Also tell them if you smoke, drink alcohol, or use illegal drugs. Some items  may interact with your medicine. What should I watch for while using this medicine? Report any side effects that do not go away within 3 days to your doctor or health care professional. Call your health care provider if any unusual symptoms occur within 6 weeks of receiving this vaccine. You may still catch the flu, but the illness is not usually as bad. You cannot get the flu from the vaccine. The vaccine will not protect against colds or other illnesses that may cause fever. The vaccine is needed every year. What side effects may I notice from receiving this medicine? Side effects that you should report to your doctor or health care professional as soon as possible: -allergic reactions like skin rash, itching or hives, swelling of the face, lips, or tongue Side effects that usually do not require medical attention (report to your doctor or health care professional if they continue or are  bothersome): -fever -headache -muscle aches and pains -pain, tenderness, redness, or swelling at site where injected -weak or tired This list may not describe all possible side effects. Call your doctor for medical advice about side effects. You may report side effects to FDA at 1-800-FDA-1088. Where should I keep my medicine? This vaccine is only given in a clinic, pharmacy, doctor's office, or other health care setting and will not be stored at home. NOTE: This sheet is a summary. It may not cover all possible information. If you have questions about this medicine, talk to your doctor, pharmacist, or health care provider.  2018 Elsevier/Gold Standard (2008-03-16 09:30:40)

## 2018-05-27 ENCOUNTER — Telehealth: Payer: Self-pay

## 2018-05-27 NOTE — Telephone Encounter (Signed)
RN was calling pts granddaughter about another test. RN notice pt did not get her echo tee that was schedule in July 2019. Rn stated message will be sent to referrals.

## 2018-05-27 NOTE — Telephone Encounter (Signed)
Called and left Message with Lenna Sciara  asking if she could please call patein's daughter and have patient scheduled. Patient was suppose to have in July . Asked Melissa to call me back as well. Thanks Hinton Dyer.

## 2018-05-28 NOTE — Telephone Encounter (Addendum)
Melissa called  Me back from [facility] . Patient's daughter was called twice in July to schedule Procedure . Patient's daughter never called back.  Melissa  has called and left another message this am to schedule. I will also call patient's daughter to please call Melissa back to schedule .

## 2018-05-28 NOTE — Telephone Encounter (Signed)
Patient is scheduled for 06/11/2018 daughter is aware is called Melissa back at [place].

## 2018-06-10 NOTE — H&P (View-Only) (Signed)
ELECTROPHYSIOLOGY CONSULT NOTE  Patient ID: Mikayla Reynolds MRN: [id number], DOB/AGE: [birth year]  Date of Consult: 06/11/2018  Primary Physician: Ladell Pier, MD Primary Cardiologist: new to [facility] Reason for Consultation: Cryptogenic stroke; recommendations regarding Implantable Loop Recorder  History of Present Illness EP has been asked to evaluate Mikayla Reynolds for placement of an implantable loop recorder to monitor for atrial fibrillation by Dr Leonie Man.  The patient was admitted in April of this year with slurred speech and left facial droop.  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  She has undergone workup for stroke including echocardiogram and carotid imaging.  The patient was monitored on telemetry which demonstrated sinus rhythm with no arrhythmias.  She was seen by Dr Leonie Man in follow up who recommended TEE and ILR implantation to look for atrial fibrillation.   Echocardiogram 12/2017 demonstrated EF 50-55%, no RWMA, LA 39.  Lab work is reviewed.  The patient denies chest pain, shortness of breath, dizziness, palpitations, or syncope.  Past Medical History:  Diagnosis Date  . Chest pain 12/27/2017  . CKD (chronic kidney disease), stage III ([place]) 02/19/2018  . Elevated troponin 12/27/2017  . Pre-diabetes   . Stroke ([place])   . TIA (transient ischemic attack)      Surgical History:  Past Surgical History:  Procedure Laterality Date  . APPENDECTOMY        Current Outpatient Medications:  .  aspirin EC 81 MG tablet, Take 1 tablet (81 mg total) by mouth daily., Disp: 100 tablet, Rfl: 1 .  atorvastatin (LIPITOR) 40 MG tablet, Take 1 tablet (40 mg total) by mouth daily at 6 PM., Disp: 30 tablet, Rfl: 11 .  polyethylene glycol powder (GLYCOLAX/MIRALAX) powder, Take 17 g by mouth 3 (three) times a week., Disp: 3350 g, Rfl: 1 .  PRESCRIPTION MEDICATION, Take 20 mg by mouth See admin instructions. "Vastarel" 20 mg -trimetazidine dihydrochloride  - from [place] pharmacy/ take one tablet (20 mg) twice daily, Disp: , Rfl:  .  PRESCRIPTION MEDICATION, Take 4 mg by mouth See admin instructions. Candesartan 8 mg - from [place] pharmacy - take 1/2 tablet (4 mg) by mouth every morning, Disp: , Rfl:  .  PRESCRIPTION MEDICATION, Take 0.25 mg by mouth See admin instructions. Alprazolam 0.5 mg - from [place] pharmacy/ take 1/2 tablet (0.25 mg) by mouth daily at bedtime, Disp: , Rfl:    Allergies: No Known Allergies  Social History   Socioeconomic History  . Marital status: Widowed    Spouse name: Not on file  . Number of children: Not on file  . Years of education: Not on file  . Highest education level: Not on file  Occupational History  . Not on file  Social Needs  . Financial resource strain: Not on file  . Food insecurity:    Worry: Not on file    Inability: Not on file  . Transportation needs:    Medical: Not on file    Non-medical: Not on file  Tobacco Use  . Smoking status: Never Smoker  . Smokeless tobacco: Never Used  Substance and Sexual Activity  . Alcohol use: Not Currently  . Drug use: Not Currently  . Sexual activity: Not on file  Lifestyle  . Physical activity:    Days per week: Not on file    Minutes per session: Not on file  . Stress: Not on file  Relationships  . Social connections:    Talks on phone: Not on file  Gets together: Not on file    Attends religious service: Not on file    Active member of club or organization: Not on file    Attends meetings of clubs or organizations: Not on file    Relationship status: Not on file  . Intimate partner violence:    Fear of current or ex partner: Not on file    Emotionally abused: Not on file    Physically abused: Not on file    Forced sexual activity: Not on file  Other Topics Concern  . Not on file  Social History Narrative  . Not on file    Family History: no premature CAD    Review of Systems: All other systems reviewed and are otherwise  negative except as noted above.  Physical Exam: Vitals:   06/11/18 1210  BP: 124/74  Pulse: 66  SpO2: 98%  Weight: 132 lb 12.8 oz (60.2 kg)  Height: 5\' 3"  (1.6 m)    GEN- The patient is elderly appearing, alert and oriented x 3 today.   Head- normocephalic, atraumatic Eyes-  Sclera clear, conjunctiva pink Ears- hearing intact Oropharynx- clear Neck- supple Lungs- Clear to ausculation bilaterally, normal work of breathing Heart- Regular rate and rhythm  GI- soft, NT, ND, + BS Extremities- no clubbing, cyanosis, or edema MS- no significant deformity or atrophy Skin- no rash or lesion Psych- euthymic mood, full affect   Labs:   Lab Results  Component Value Date   WBC 5.3 12/28/2017   HGB 12.2 12/28/2017   HCT 37.1 12/28/2017   MCV 95.1 12/28/2017   PLT 159 12/28/2017   No results for input(s): NA, K, CL, CO2, BUN, CREATININE, CALCIUM, PROT, BILITOT, ALKPHOS, ALT, AST, GLUCOSE in the last 168 hours.  Invalid input(s): LABALBU   Radiology/Studies: No results found.  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  Dr Leonie Man has recommended TEE and ILR implant to look for atrial fibrillation. I spoke at length with the patient about monitoring for afib with an implantable loop recorder.  Risks, benefits, and alteratives to TEE and implantable loop recorder were discussed with the patient today.   At this time, the patient is very clear in their decision to proceed with implantable loop recorder.   [name] MD

## 2018-06-10 NOTE — Progress Notes (Signed)
ELECTROPHYSIOLOGY CONSULT NOTE  Patient ID: Mikayla Reynolds MRN: [id number], DOB/AGE: 1941/03/12  Date of Consult: 06/11/2018  Primary Physician: Ladell Pier, MD Primary Cardiologist: new to [facility] Reason for Consultation: Cryptogenic stroke; recommendations regarding Implantable Loop Recorder  History of Present Illness EP has been asked to evaluate Mikayla Reynolds for placement of an implantable loop recorder to monitor for atrial fibrillation by Dr Leonie Man.  The patient was admitted in April of this year with slurred speech and left facial droop.  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  She has undergone workup for stroke including echocardiogram and carotid imaging.  The patient was monitored on telemetry which demonstrated sinus rhythm with no arrhythmias.  She was seen by Dr Leonie Man in follow up who recommended TEE and ILR implantation to look for atrial fibrillation.   Echocardiogram 12/2017 demonstrated EF 50-55%, no RWMA, LA 39.  Lab work is reviewed.  The patient denies chest pain, shortness of breath, dizziness, palpitations, or syncope.  Past Medical History:  Diagnosis Date  . Chest pain 12/27/2017  . CKD (chronic kidney disease), stage III ([place]) 02/19/2018  . Elevated troponin 12/27/2017  . Pre-diabetes   . Stroke ([place])   . TIA (transient ischemic attack)      Surgical History:  Past Surgical History:  Procedure Laterality Date  . APPENDECTOMY        Current Outpatient Medications:  .  aspirin EC 81 MG tablet, Take 1 tablet (81 mg total) by mouth daily., Disp: 100 tablet, Rfl: 1 .  atorvastatin (LIPITOR) 40 MG tablet, Take 1 tablet (40 mg total) by mouth daily at 6 PM., Disp: 30 tablet, Rfl: 11 .  polyethylene glycol powder (GLYCOLAX/MIRALAX) powder, Take 17 g by mouth 3 (three) times a week., Disp: 3350 g, Rfl: 1 .  PRESCRIPTION MEDICATION, Take 20 mg by mouth See admin instructions. "Vastarel" 20 mg -trimetazidine dihydrochloride  - from [place] pharmacy/ take one tablet (20 mg) twice daily, Disp: , Rfl:  .  PRESCRIPTION MEDICATION, Take 4 mg by mouth See admin instructions. Candesartan 8 mg - from [place] pharmacy - take 1/2 tablet (4 mg) by mouth every morning, Disp: , Rfl:  .  PRESCRIPTION MEDICATION, Take 0.25 mg by mouth See admin instructions. Alprazolam 0.5 mg - from [place] pharmacy/ take 1/2 tablet (0.25 mg) by mouth daily at bedtime, Disp: , Rfl:    Allergies: No Known Allergies  Social History   Socioeconomic History  . Marital status: Widowed    Spouse name: Not on file  . Number of children: Not on file  . Years of education: Not on file  . Highest education level: Not on file  Occupational History  . Not on file  Social Needs  . Financial resource strain: Not on file  . Food insecurity:    Worry: Not on file    Inability: Not on file  . Transportation needs:    Medical: Not on file    Non-medical: Not on file  Tobacco Use  . Smoking status: Never Smoker  . Smokeless tobacco: Never Used  Substance and Sexual Activity  . Alcohol use: Not Currently  . Drug use: Not Currently  . Sexual activity: Not on file  Lifestyle  . Physical activity:    Days per week: Not on file    Minutes per session: Not on file  . Stress: Not on file  Relationships  . Social connections:    Talks on phone: Not on file  Gets together: Not on file    Attends religious service: Not on file    Active member of club or organization: Not on file    Attends meetings of clubs or organizations: Not on file    Relationship status: Not on file  . Intimate partner violence:    Fear of current or ex partner: Not on file    Emotionally abused: Not on file    Physically abused: Not on file    Forced sexual activity: Not on file  Other Topics Concern  . Not on file  Social History Narrative  . Not on file    Family History: no premature CAD    Review of Systems: All other systems reviewed and are otherwise  negative except as noted above.  Physical Exam: Vitals:   06/11/18 1210  BP: 124/74  Pulse: 66  SpO2: 98%  Weight: 132 lb 12.8 oz (60.2 kg)  Height: 5\' 3"  (1.6 m)    GEN- The patient is elderly appearing, alert and oriented x 3 today.   Head- normocephalic, atraumatic Eyes-  Sclera clear, conjunctiva pink Ears- hearing intact Oropharynx- clear Neck- supple Lungs- Clear to ausculation bilaterally, normal work of breathing Heart- Regular rate and rhythm  GI- soft, NT, ND, + BS Extremities- no clubbing, cyanosis, or edema MS- no significant deformity or atrophy Skin- no rash or lesion Psych- euthymic mood, full affect   Labs:   Lab Results  Component Value Date   WBC 5.3 12/28/2017   HGB 12.2 12/28/2017   HCT 37.1 12/28/2017   MCV 95.1 12/28/2017   PLT 159 12/28/2017   No results for input(s): NA, K, CL, CO2, BUN, CREATININE, CALCIUM, PROT, BILITOT, ALKPHOS, ALT, AST, GLUCOSE in the last 168 hours.  Invalid input(s): LABALBU   Radiology/Studies: No results found.  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  Dr Leonie Man has recommended TEE and ILR implant to look for atrial fibrillation. I spoke at length with the patient about monitoring for afib with an implantable loop recorder.  Risks, benefits, and alteratives to TEE and implantable loop recorder were discussed with the patient today.   At this time, the patient is very clear in their decision to proceed with implantable loop recorder.   [name] MD

## 2018-06-11 ENCOUNTER — Ambulatory Visit: Payer: Medicaid Other | Admitting: Internal Medicine

## 2018-06-11 ENCOUNTER — Encounter: Payer: Self-pay | Admitting: Nurse Practitioner

## 2018-06-11 VITALS — BP 124/74 | HR 66 | Ht 63.0 in | Wt 132.8 lb

## 2018-06-11 DIAGNOSIS — I639 Cerebral infarction, unspecified: Secondary | ICD-10-CM

## 2018-06-11 NOTE — Patient Instructions (Addendum)
  Medication Instructions:  Your physician recommends that you continue on your current medications as directed. Please refer to the Current Medication list given to you today.   Testing/Procedures:   Your physician has requested that you have a TEE. During a TEE, sound waves are used to create images of your heart. It provides your doctor with information about the size and shape of your heart and how well your heart's chambers and valves are working. In this test, a transducer is attached to the end of a flexible tube that's guided down your throat and into your esophagus (the tube leading from you mouth to your stomach) to get a more detailed image of your heart. You are not awake for the procedure. Please see the instruction sheet given to you today. For further information please visit [email or web address].     You will have a loop recorder implanted after your TEE.     Follow-Up: You will follow up with the device clinic in 7-10 days after your procedure for a wound check.        Dear Mikayla Reynolds (patient name) You are scheduled for a TEE on 10/22 (Tuesday) with Dr. Rayann Heman.  Please arrive at the [facility] (Main Entrance A) at [facility]: [address] [place], [place] [address] at 10:30 am. if lab work is needed arrive 1.5 hours ahead)  DIET: Nothing to eat or drink after midnight except a sip of water with medications (see medication instructions below)   Labs: If patient is on Coumadin, patient needs pt/INR, CBC, BMET within 3 days (No pt/INR needed for patients taking Xarelto, Eliquis, Pradaxa) For patients receiving anesthesia for TEE and all Cardioversion patients: BMET, CBC within 1 week  You must have a responsible person to drive you home and stay in the waiting area during your procedure. Failure to do so could result in cancellation.  Bring your insurance cards.  *Special Note: Every effort is made to have your procedure done on time. Occasionally there are  emergencies that occur at the hospital that may cause delays. Please be patient if a delay does occur.

## 2018-06-22 ENCOUNTER — Encounter: Payer: Self-pay | Admitting: Neurology

## 2018-06-22 ENCOUNTER — Ambulatory Visit: Payer: Medicaid Other | Admitting: Neurology

## 2018-06-22 VITALS — BP 105/65 | HR 61 | Wt 135.0 lb

## 2018-06-22 DIAGNOSIS — I639 Cerebral infarction, unspecified: Secondary | ICD-10-CM

## 2018-06-22 NOTE — H&P (View-Only) (Signed)
[facility] [address] [place]. [place] [address] [phone]       OFFICE FOLLOW UP VISIT NOTE  Mikayla. Mikayla Reynolds Date of Birth:  1941-08-14 Medical Record Number:  [id number]   Referring MD:  Merlene Laughter  Reason for Referral:  stroke HPI: Initial visit 03/17/18 : Mikayla Reynolds is a pleasant 77 year old [place] lady who seen today for first office follow-up visit for hospital admission for stroke in April 2019. She is a accompanied by her granddaughter who translates for her as patient speaks no [place]. History is obtained from them as well as review of electronic medical records. I have personally reviewed imaging for lemons in hospital stroke workup.HPI:( Dr Lorraine Lax)  Mikayla Reynolds is an 77 y.o. [place] female with a past medical history of 2 previous TIAs, hypertension who presents to the ED with a 2-day complaint of left sided hemiparesis, left paresthesia, dysphagia, dysarthria left facial droop and impaired mobility.  Patient is [place] speaking and she refused for iPad [place] interpreter, and opted for granddaughter at the bedside to interpret for her.Per family, patient's son last saw her at 1 PM on Wednesday, 12/25/2017, he went out and returned around 8 PM.  At that time patient was confused, was speaking gibberish, with slurred speech, left-sided facial droop, unsteady gait but was still walking around as well as left sided weakness.  At baseline patient is left-handed, when she was eating dinner she was unable to hold the spoon in her left hand and had drooling of the fluid out of the left side of her mouth with some difficulty swallowing.  Patient refused to come to the ER per family but they just had to bring her in today due to the persistency of her symptoms. Per patient through interpretation by her granddaughter, she had a headache starting late Thursday, which continues to be persistent up until now, difficulty sleeping, blurred vision which has since  resolved, denies trauma or falls. Per family patient has had 2 previous TIAs, first 1 in [place] 2 years ago with the exact same symptoms which resolved spontaneously, second one was about 9 months ago with the exact same symptoms, patient refused to go to the hospital at that time and by the time she had woken up the next morning symptoms had resolved. On admission to the ER patient is very compliant, does answer questions appropriately, per family continues to have slurred speech, but more lucid with more coherent speech.  Initial CT of the head done in the ER showed acute right MCA distribution infarct.  Neuro was consulted for further evaluation.  Patient denies dizziness, nausea, vomiting she admits to intermittent chest pain and family says has some anxiety.  Troponin on admission was elevated at 0.1, EKG was normal sinus rhythm no ST-t wave changes consistent with ischemia, chest x-ray pending, normal electrolytes within normal limits. 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 MRI scan confirmed a right frontal MCA branch infarct. Carotid ultrasound was not done. LDL cholesterol was elevated 120 mg percent. In women A1c was 6. Transthoracic echo showed normal ejection fraction. Telemetric cardiac monitoring did not reveal any paroxysmal A. fib. Patient was started on aspirin for stroke prevention. Physical occupational therapy were consulted. Patient did well and was discharged home. She has finished home therapies. Her speech and swallowing have improved. She still has some diminished fine motor skills  in the left hand. She still drops objects from hand. She is tolerating aspirin well without bleeding or bruising. She is also on Plavix. Transesophageal echocardiogram and prolonged cardiac monitoring for some reason the not ordered. Patient has no new complaints. Update  06/22/18 : She returns for follow-up after last visit 3 months ago.  She is accompanied by granddaughter and [place] language interpreter who was present throughout this visit.  Patient continues to do well.  She has had no recurrent stroke or TIA symptoms.  She still complains of left hand weakness and numbness and diminished fine motor skills.  She does exercise a left hand regularly.  She is tolerating aspirin well without bruising or bleeding.  She remains on Lipitor and is tolerating it well.  She has no new complaints.  The patient was scheduled to undergo TEE and loop recorder but that has not yet happened and is scheduled for later this week.   ROS:   14 system review of systems is positive for trouble swallowing, chest pain, anxiety, nervousness memory loss, hand weakness and all systems negative  PMH:  Past Medical History:  Diagnosis Date  . Chest pain 12/27/2017  . CKD (chronic kidney disease), stage III ([place]) 02/19/2018  . Elevated troponin 12/27/2017  . Pre-diabetes   . Stroke ([place])   . TIA (transient ischemic attack)     Social History:  Social History   Socioeconomic History  . Marital status: Widowed    Spouse name: Not on file  . Number of children: Not on file  . Years of education: Not on file  . Highest education level: Not on file  Occupational History  . Not on file  Social Needs  . Financial resource strain: Not on file  . Food insecurity:    Worry: Not on file    Inability: Not on file  . Transportation needs:    Medical: Not on file    Non-medical: Not on file  Tobacco Use  . Smoking status: Never Smoker  . Smokeless tobacco: Never Used  Substance and Sexual Activity  . Alcohol use: Not Currently  . Drug use: Not Currently  . Sexual activity: Not on file  Lifestyle  . Physical activity:    Days per week: Not on file    Minutes per session: Not on file  . Stress: Not on file  Relationships  . Social connections:    Talks on phone: Not on file     Gets together: Not on file    Attends religious service: Not on file    Active member of club or organization: Not on file    Attends meetings of clubs or organizations: Not on file    Relationship status: Not on file  . Intimate partner violence:    Fear of current or ex partner: Not on file    Emotionally abused: Not on file    Physically abused: Not on file    Forced sexual activity: Not on file  Other Topics Concern  . Not on file  Social History Narrative  . Not on file    Medications:   Current Outpatient Medications on File Prior to Visit  Medication Sig Dispense Refill  . aspirin EC 81 MG tablet Take 1 tablet (81 mg total) by mouth daily. 100 tablet 1  . atorvastatin (LIPITOR) 40 MG tablet Take 1 tablet (40 mg total) by mouth daily at 6 PM. 30 tablet 11  . polyethylene glycol powder (GLYCOLAX/MIRALAX) powder Take 17 g  by mouth 3 (three) times a week. (Patient taking differently: Take 17 g by mouth daily as needed (for constipation.). ) 3350 g 1  . PRESCRIPTION MEDICATION Take 4 mg by mouth See admin instructions. Candesartan 8 mg - from [place] pharmacy - take 1/2 tablet (4 mg) by mouth every morning    . PRESCRIPTION MEDICATION Take 0.25 mg by mouth See admin instructions. Alprazolam 0.5 mg - from [place] pharmacy/ take 1/2 tablet (0.25 mg) by mouth daily at bedtime    . PRESCRIPTION MEDICATION Take 20 mg by mouth See admin instructions. "Vastarel" 20 mg -trimetazidine dihydrochloride - from [place] pharmacy/ take one tablet (20 mg) twice daily     No current facility-administered medications on file prior to visit.     Allergies:  No Known Allergies  Physical Exam General: Frail elderly Asian lady seated, in no evident distress Head: head normocephalic and atraumatic.   Neck: supple with no carotid or supraclavicular bruits Cardiovascular: regular rate and rhythm, no murmurs Musculoskeletal: no deformity Skin:  no rash/petichiae Vascular:  Normal pulses all  extremities  Neurologic Exam Mental Status: Awake and fully alert. Oriented to place and time. Recent and remote memory intact. Attention span, concentration and fund of knowledge appropriate. Mood and affect appropriate.  Cranial Nerves: Fundoscopic exam reveals sharp disc margins. Pupils equal, briskly reactive to light. Extraocular movements full without nystagmus. Visual fields full to confrontation. Hearing intact. Facial sensation intact. Mild left lower facial asymmetry., tongue, palate moves normally and symmetrically.  Motor: Normal bulk and tone. Normal strength in all tested extremity muscles. Mild weakness of left grip and intrinsic hand muscles. Left index has trigger finger orbits right over left upper extremity. Sensory.: intact to touch , pinprick , position and vibratory sensation.  Coordination: Rapid alternating movements normal in all extremities. Finger-to-nose and heel-to-shin performed accurately bilaterally. Gait and Station: Arises from chair without difficulty. Stance is normal. Gait demonstrates normal stride length and balance . Able to heel, toe and tandem walk without difficulty.  Reflexes: 1+ and symmetric. Toes downgoing.   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 Vascular risk factors of hyperlipidemia and age only    PLAN: I had a long d/w patient , grand daughter using a [place] language interpreter about her recent cryptogenic stroke, risk for recurrent stroke/TIAs, personally independently reviewed imaging studies and stroke evaluation results and answered questions.Continue aspirin 81 mg daily  for secondary stroke prevention and maintain strict control of hypertension with blood pressure goal below 130/90, diabetes with hemoglobin A1c goal below 6.5% and lipids with LDL cholesterol goal below 70 mg/dL. I also  advised the patient to eat a healthy diet with plenty of whole grains, cereals, fruits and vegetables, exercise regularly and maintain ideal body weight . Check TEE and loop recorder tomorrow .Followup in the future with my nurse practitioner Janett Billow in 6 months or call earlier if necessaryGreater than 50% time during this 25 minute   visit was spent on counseling and coordination of care about a cryptogenic stroke and discussion about evaluation and treatment and stroke prevention. Antony Contras, MD  [facility] [address] [place], [place] [address]  Phone [phone] Fax [phone] Note: This document was prepared with digital dictation and possible smart phrase technology. Any transcriptional errors that result from this process are unintentional.

## 2018-06-22 NOTE — Patient Instructions (Signed)
I had a long d/w patient , grand daughter using a [place] language interpreter about her recent cryptogenic stroke, risk for recurrent stroke/TIAs, personally independently reviewed imaging studies and stroke evaluation results and answered questions.Continue aspirin 81 mg daily  for secondary stroke prevention and maintain strict control of hypertension with blood pressure goal below 130/90, diabetes with hemoglobin A1c goal below 6.5% and lipids with LDL cholesterol goal below 70 mg/dL. I also advised the patient to eat a healthy diet with plenty of whole grains, cereals, fruits and vegetables, exercise regularly and maintain ideal body weight . Check TEE and loop recorder tomorrow .Followup in the future with my nurse practitioner Janett Billow in 6 months or call earlier if necessary

## 2018-06-22 NOTE — Progress Notes (Signed)
[facility] [address] [place]. [place] [address] [phone]       OFFICE FOLLOW UP VISIT NOTE  Ms. Anwen Cannedy Date of Birth:  09/03/1940 Medical Record Number:  [id number]   Referring MD:  Merlene Laughter  Reason for Referral:  stroke HPI: Initial visit 03/17/18 : Ms Crume is a pleasant 77 year old [place] lady who seen today for first office follow-up visit for hospital admission for stroke in April 2019. She is a accompanied by her granddaughter who translates for her as patient speaks no [place]. History is obtained from them as well as review of electronic medical records. I have personally reviewed imaging for lemons in hospital stroke workup.HPI:( Dr Lorraine Lax)  [name] is an 77 y.o. [place] female with a past medical history of 2 previous TIAs, hypertension who presents to the ED with a 2-day complaint of left sided hemiparesis, left paresthesia, dysphagia, dysarthria left facial droop and impaired mobility.  Patient is [place] speaking and she refused for iPad [place] interpreter, and opted for granddaughter at the bedside to interpret for her.Per family, patient's son last saw her at 1 PM on Wednesday, 12/25/2017, he went out and returned around 8 PM.  At that time patient was confused, was speaking gibberish, with slurred speech, left-sided facial droop, unsteady gait but was still walking around as well as left sided weakness.  At baseline patient is left-handed, when she was eating dinner she was unable to hold the spoon in her left hand and had drooling of the fluid out of the left side of her mouth with some difficulty swallowing.  Patient refused to come to the ER per family but they just had to bring her in today due to the persistency of her symptoms. Per patient through interpretation by her granddaughter, she had a headache starting late Thursday, which continues to be persistent up until now, difficulty sleeping, blurred vision which has since  resolved, denies trauma or falls. Per family patient has had 2 previous TIAs, first 1 in [place] 2 years ago with the exact same symptoms which resolved spontaneously, second one was about 9 months ago with the exact same symptoms, patient refused to go to the hospital at that time and by the time she had woken up the next morning symptoms had resolved. On admission to the ER patient is very compliant, does answer questions appropriately, per family continues to have slurred speech, but more lucid with more coherent speech.  Initial CT of the head done in the ER showed acute right MCA distribution infarct.  Neuro was consulted for further evaluation.  Patient denies dizziness, nausea, vomiting she admits to intermittent chest pain and family says has some anxiety.  Troponin on admission was elevated at 0.1, EKG was normal sinus rhythm no ST-t wave changes consistent with ischemia, chest x-ray pending, normal electrolytes within normal limits. 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 MRI scan confirmed a right frontal MCA branch infarct. Carotid ultrasound was not done. LDL cholesterol was elevated 120 mg percent. In women A1c was 6. Transthoracic echo showed normal ejection fraction. Telemetric cardiac monitoring did not reveal any paroxysmal A. fib. Patient was started on aspirin for stroke prevention. Physical occupational therapy were consulted. Patient did well and was discharged home. She has finished home therapies. Her speech and swallowing have improved. She still has some diminished fine motor skills  in the left hand. She still drops objects from hand. She is tolerating aspirin well without bleeding or bruising. She is also on Plavix. Transesophageal echocardiogram and prolonged cardiac monitoring for some reason the not ordered. Patient has no new complaints. Update  06/22/18 : She returns for follow-up after last visit 3 months ago.  She is accompanied by granddaughter and [place] language interpreter who was present throughout this visit.  Patient continues to do well.  She has had no recurrent stroke or TIA symptoms.  She still complains of left hand weakness and numbness and diminished fine motor skills.  She does exercise a left hand regularly.  She is tolerating aspirin well without bruising or bleeding.  She remains on Lipitor and is tolerating it well.  She has no new complaints.  The patient was scheduled to undergo TEE and loop recorder but that has not yet happened and is scheduled for later this week.   ROS:   14 system review of systems is positive for trouble swallowing, chest pain, anxiety, nervousness memory loss, hand weakness and all systems negative  PMH:  Past Medical History:  Diagnosis Date  . Chest pain 12/27/2017  . CKD (chronic kidney disease), stage III ([place]) 02/19/2018  . Elevated troponin 12/27/2017  . Pre-diabetes   . Stroke ([place])   . TIA (transient ischemic attack)     Social History:  Social History   Socioeconomic History  . Marital status: Widowed    Spouse name: Not on file  . Number of children: Not on file  . Years of education: Not on file  . Highest education level: Not on file  Occupational History  . Not on file  Social Needs  . Financial resource strain: Not on file  . Food insecurity:    Worry: Not on file    Inability: Not on file  . Transportation needs:    Medical: Not on file    Non-medical: Not on file  Tobacco Use  . Smoking status: Never Smoker  . Smokeless tobacco: Never Used  Substance and Sexual Activity  . Alcohol use: Not Currently  . Drug use: Not Currently  . Sexual activity: Not on file  Lifestyle  . Physical activity:    Days per week: Not on file    Minutes per session: Not on file  . Stress: Not on file  Relationships  . Social connections:    Talks on phone: Not on file     Gets together: Not on file    Attends religious service: Not on file    Active member of club or organization: Not on file    Attends meetings of clubs or organizations: Not on file    Relationship status: Not on file  . Intimate partner violence:    Fear of current or ex partner: Not on file    Emotionally abused: Not on file    Physically abused: Not on file    Forced sexual activity: Not on file  Other Topics Concern  . Not on file  Social History Narrative  . Not on file    Medications:   Current Outpatient Medications on File Prior to Visit  Medication Sig Dispense Refill  . aspirin EC 81 MG tablet Take 1 tablet (81 mg total) by mouth daily. 100 tablet 1  . atorvastatin (LIPITOR) 40 MG tablet Take 1 tablet (40 mg total) by mouth daily at 6 PM. 30 tablet 11  . polyethylene glycol powder (GLYCOLAX/MIRALAX) powder Take 17 g  by mouth 3 (three) times a week. (Patient taking differently: Take 17 g by mouth daily as needed (for constipation.). ) 3350 g 1  . PRESCRIPTION MEDICATION Take 4 mg by mouth See admin instructions. Candesartan 8 mg - from [place] pharmacy - take 1/2 tablet (4 mg) by mouth every morning    . PRESCRIPTION MEDICATION Take 0.25 mg by mouth See admin instructions. Alprazolam 0.5 mg - from [place] pharmacy/ take 1/2 tablet (0.25 mg) by mouth daily at bedtime    . PRESCRIPTION MEDICATION Take 20 mg by mouth See admin instructions. "Vastarel" 20 mg -trimetazidine dihydrochloride - from [place] pharmacy/ take one tablet (20 mg) twice daily     No current facility-administered medications on file prior to visit.     Allergies:  No Known Allergies  Physical Exam General: Frail elderly Asian lady seated, in no evident distress Head: head normocephalic and atraumatic.   Neck: supple with no carotid or supraclavicular bruits Cardiovascular: regular rate and rhythm, no murmurs Musculoskeletal: no deformity Skin:  no rash/petichiae Vascular:  Normal pulses all  extremities  Neurologic Exam Mental Status: Awake and fully alert. Oriented to place and time. Recent and remote memory intact. Attention span, concentration and fund of knowledge appropriate. Mood and affect appropriate.  Cranial Nerves: Fundoscopic exam reveals sharp disc margins. Pupils equal, briskly reactive to light. Extraocular movements full without nystagmus. Visual fields full to confrontation. Hearing intact. Facial sensation intact. Mild left lower facial asymmetry., tongue, palate moves normally and symmetrically.  Motor: Normal bulk and tone. Normal strength in all tested extremity muscles. Mild weakness of left grip and intrinsic hand muscles. Left index has trigger finger orbits right over left upper extremity. Sensory.: intact to touch , pinprick , position and vibratory sensation.  Coordination: Rapid alternating movements normal in all extremities. Finger-to-nose and heel-to-shin performed accurately bilaterally. Gait and Station: Arises from chair without difficulty. Stance is normal. Gait demonstrates normal stride length and balance . Able to heel, toe and tandem walk without difficulty.  Reflexes: 1+ and symmetric. Toes downgoing.   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 Vascular risk factors of hyperlipidemia and age only    PLAN: I had a long d/w patient , grand daughter using a [place] language interpreter about her recent cryptogenic stroke, risk for recurrent stroke/TIAs, personally independently reviewed imaging studies and stroke evaluation results and answered questions.Continue aspirin 81 mg daily  for secondary stroke prevention and maintain strict control of hypertension with blood pressure goal below 130/90, diabetes with hemoglobin A1c goal below 6.5% and lipids with LDL cholesterol goal below 70 mg/dL. I also  advised the patient to eat a healthy diet with plenty of whole grains, cereals, fruits and vegetables, exercise regularly and maintain ideal body weight . Check TEE and loop recorder tomorrow .Followup in the future with my nurse practitioner Janett Billow in 6 months or call earlier if necessaryGreater than 50% time during this 25 minute   visit was spent on counseling and coordination of care about a cryptogenic stroke and discussion about evaluation and treatment and stroke prevention. Antony Contras, MD  [facility] [address] [place], [place] [address]  Phone [phone] Fax [phone] Note: This document was prepared with digital dictation and possible smart phrase technology. Any transcriptional errors that result from this process are unintentional.

## 2018-06-23 ENCOUNTER — Other Ambulatory Visit: Payer: Self-pay

## 2018-06-23 ENCOUNTER — Ambulatory Visit (HOSPITAL_COMMUNITY): Payer: Medicaid Other | Admitting: Certified Registered Nurse Anesthetist

## 2018-06-23 ENCOUNTER — Encounter (HOSPITAL_COMMUNITY)
Admission: RE | Disposition: A | Discharge status: Home / Self Care | Payer: Self-pay | Source: Ambulatory Visit | Attending: Cardiology

## 2018-06-23 ENCOUNTER — Other Ambulatory Visit (HOSPITAL_COMMUNITY): Payer: Medicaid Other

## 2018-06-23 ENCOUNTER — Ambulatory Visit (HOSPITAL_COMMUNITY)
Admission: RE | Admit: 2018-06-23 | Discharge: 2018-06-23 | Disposition: A | Discharge status: Home / Self Care | Payer: Medicaid Other | Source: Ambulatory Visit | Attending: Cardiology | Admitting: Cardiology

## 2018-06-23 ENCOUNTER — Encounter (HOSPITAL_COMMUNITY): Payer: Self-pay | Admitting: *Deleted

## 2018-06-23 ENCOUNTER — Ambulatory Visit (HOSPITAL_BASED_OUTPATIENT_CLINIC_OR_DEPARTMENT_OTHER)
Admission: RE | Admit: 2018-06-23 | Discharge: 2018-06-23 | Disposition: A | Discharge status: Home / Self Care | Payer: Medicaid Other | Source: Ambulatory Visit | Attending: Cardiology | Admitting: Cardiology

## 2018-06-23 DIAGNOSIS — R7303 Prediabetes: Secondary | ICD-10-CM | POA: Insufficient documentation

## 2018-06-23 DIAGNOSIS — I6389 Other cerebral infarction: Secondary | ICD-10-CM | POA: Diagnosis not present

## 2018-06-23 DIAGNOSIS — Z7902 Long term (current) use of antithrombotics/antiplatelets: Secondary | ICD-10-CM | POA: Diagnosis not present

## 2018-06-23 DIAGNOSIS — R7989 Other specified abnormal findings of blood chemistry: Secondary | ICD-10-CM | POA: Diagnosis not present

## 2018-06-23 DIAGNOSIS — I639 Cerebral infarction, unspecified: Secondary | ICD-10-CM | POA: Insufficient documentation

## 2018-06-23 DIAGNOSIS — I129 Hypertensive chronic kidney disease with stage 1 through stage 4 chronic kidney disease, or unspecified chronic kidney disease: Secondary | ICD-10-CM | POA: Diagnosis not present

## 2018-06-23 DIAGNOSIS — N183 Chronic kidney disease, stage 3 (moderate): Secondary | ICD-10-CM | POA: Insufficient documentation

## 2018-06-23 DIAGNOSIS — I340 Nonrheumatic mitral (valve) insufficiency: Secondary | ICD-10-CM | POA: Diagnosis not present

## 2018-06-23 DIAGNOSIS — I083 Combined rheumatic disorders of mitral, aortic and tricuspid valves: Secondary | ICD-10-CM | POA: Diagnosis not present

## 2018-06-23 DIAGNOSIS — I371 Nonrheumatic pulmonary valve insufficiency: Secondary | ICD-10-CM | POA: Diagnosis not present

## 2018-06-23 DIAGNOSIS — R1310 Dysphagia, unspecified: Secondary | ICD-10-CM | POA: Insufficient documentation

## 2018-06-23 DIAGNOSIS — R29810 Facial weakness: Secondary | ICD-10-CM | POA: Insufficient documentation

## 2018-06-23 DIAGNOSIS — I10 Essential (primary) hypertension: Secondary | ICD-10-CM | POA: Diagnosis not present

## 2018-06-23 DIAGNOSIS — Z7982 Long term (current) use of aspirin: Secondary | ICD-10-CM | POA: Diagnosis not present

## 2018-06-23 DIAGNOSIS — F419 Anxiety disorder, unspecified: Secondary | ICD-10-CM | POA: Insufficient documentation

## 2018-06-23 DIAGNOSIS — I4891 Unspecified atrial fibrillation: Secondary | ICD-10-CM | POA: Diagnosis not present

## 2018-06-23 HISTORY — PX: TEE WITHOUT CARDIOVERSION: SHX5443

## 2018-06-23 HISTORY — PX: LOOP RECORDER INSERTION: EP1214

## 2018-06-23 SURGERY — ECHOCARDIOGRAM, TRANSESOPHAGEAL
Anesthesia: Monitor Anesthesia Care

## 2018-06-23 SURGERY — LOOP RECORDER INSERTION
Anesthesia: LOCAL

## 2018-06-23 MED ORDER — LIDOCAINE 2% (20 MG/ML) 5 ML SYRINGE
INTRAMUSCULAR | Status: DC | PRN
  Administered 2018-06-23: 100 mg via INTRAVENOUS

## 2018-06-23 MED ORDER — PHENYLEPHRINE 40 MCG/ML (10ML) SYRINGE FOR IV PUSH (FOR BLOOD PRESSURE SUPPORT)
PREFILLED_SYRINGE | INTRAVENOUS | Status: DC | PRN
  Administered 2018-06-23 (×2): 40 ug via INTRAVENOUS
  Administered 2018-06-23: 80 ug via INTRAVENOUS

## 2018-06-23 MED ORDER — LIDOCAINE-EPINEPHRINE 1 %-1:100000 IJ SOLN
INTRAMUSCULAR | Status: DC | PRN
  Administered 2018-06-23: 20 mL

## 2018-06-23 MED ORDER — PROPOFOL 10 MG/ML IV BOLUS
INTRAVENOUS | Status: DC | PRN
  Administered 2018-06-23 (×2): 10 mg via INTRAVENOUS

## 2018-06-23 MED ORDER — SODIUM CHLORIDE 0.9 % IV SOLN: INTRAVENOUS | Status: DC

## 2018-06-23 MED ORDER — LIDOCAINE-EPINEPHRINE 1 %-1:100000 IJ SOLN
INTRAMUSCULAR | Status: AC
  Filled 2018-06-23: qty 1

## 2018-06-23 MED ORDER — PROPOFOL 500 MG/50ML IV EMUL
INTRAVENOUS | Status: DC | PRN
  Administered 2018-06-23: 50 ug/kg/min via INTRAVENOUS

## 2018-06-23 SURGICAL SUPPLY — 2 items
LOOP REVEAL LINQSYS (Prosthesis & Implant Heart) ×2 IMPLANT
PACK LOOP INSERTION (CUSTOM PROCEDURE TRAY) ×2 IMPLANT

## 2018-06-23 NOTE — Progress Notes (Signed)
  Echocardiogram 2D Echocardiogram has been performed.  Jannett Celestine 06/23/2018, 11:43 AM

## 2018-06-23 NOTE — Discharge Instructions (Signed)

## 2018-06-23 NOTE — Anesthesia Procedure Notes (Signed)
Procedure Name: [place] Performed by: Milford Cage, CRNA Pre-anesthesia Checklist: Patient identified, Emergency Drugs available, Suction available, Patient being monitored and Timeout performed Patient Re-evaluated:Patient Re-evaluated prior to induction Oxygen Delivery Method: Nasal cannula

## 2018-06-23 NOTE — Anesthesia Preprocedure Evaluation (Addendum)
Anesthesia Evaluation  Patient identified by MRN, date of birth, ID band Patient awake    Reviewed: Allergy & Precautions, NPO status , Patient's Chart, lab work & pertinent test results  Airway Mallampati: II  TM Distance: >3 FB Neck ROM: Full    Dental no notable dental hx.    Pulmonary neg pulmonary ROS,    Pulmonary exam normal breath sounds clear to auscultation       Cardiovascular Normal cardiovascular exam+ dysrhythmias  Rhythm:Regular Rate:Normal  ECG: rate 66  ECHO: Low normal to mildly reduced LV systolic function; EF 50; moderate LVH; restrictive filling; mild MR; moderate LAE; mild TR.      Neuro/Psych TIACVA negative psych ROS   GI/Hepatic negative GI ROS, Neg liver ROS,   Endo/Other  negative endocrine ROS  Renal/GU Renal disease     Musculoskeletal negative musculoskeletal ROS (+)   Abdominal   Peds  Hematology HLD   Anesthesia Other Findings   Reproductive/Obstetrics                            Anesthesia Physical Anesthesia Plan  ASA: II  Anesthesia Plan: MAC   Post-op Pain Management:    Induction: Intravenous  PONV Risk Score and Plan: 2 and Propofol infusion and Treatment may vary due to age or medical condition  Airway Management Planned: Nasal Cannula  Additional Equipment:   Intra-op Plan:   Post-operative Plan:   Informed Consent: I have reviewed the patients History and Physical, chart, labs and discussed the procedure including the risks, benefits and alternatives for the proposed anesthesia with the patient or authorized representative who has indicated his/her understanding and acceptance.   Dental advisory given  Plan Discussed with: CRNA  Anesthesia Plan Comments:         Anesthesia Quick Evaluation

## 2018-06-23 NOTE — Interval H&P Note (Signed)
History and Physical Interval Note:  06/23/2018 11:20 AM  Mikayla Reynolds  has presented today for surgery, with the diagnosis of a fib  The various methods of treatment have been discussed with the patient and family. After consideration of risks, benefits and other options for treatment, the patient has consented to  Procedure(s): LOOP RECORDER INSERTION (N/A) as a surgical intervention .  The patient's history has been reviewed, patient examined, no change in status, stable for surgery.  I have reviewed the patient's chart and labs.  Questions were answered to the patient's satisfaction.     [name]

## 2018-06-23 NOTE — Transfer of Care (Signed)
Immediate Anesthesia Transfer of Care Note  Patient: Mikayla Reynolds  Procedure(s) Performed: TRANSESOPHAGEAL ECHOCARDIOGRAM (TEE) Bubble Study (N/A )  Patient Location: Endoscopy Unit  Anesthesia Type:MAC  Level of Consciousness: drowsy  Airway & Oxygen Therapy: Patient Spontanous Breathing and Patient connected to nasal cannula oxygen  Post-op Assessment: Report given to RN and Post -op Vital signs reviewed and stable  Post vital signs: Reviewed and stable  Last Vitals:  Vitals Value Taken Time  BP 99/51 06/23/2018 11:38 AM  Temp 37 C 06/23/2018 11:38 AM  Pulse 54 06/23/2018 11:40 AM  Resp 21 06/23/2018 11:40 AM  SpO2 98 % 06/23/2018 11:40 AM  Vitals shown include unvalidated device data.  Last Pain:  Vitals:   06/23/18 1138  TempSrc: Oral  PainSc: 0-No pain         Complications: No apparent anesthesia complications

## 2018-06-23 NOTE — Interval H&P Note (Signed)
History and Physical Interval Note:  06/23/2018 9:56 AM  Mikayla Reynolds  has presented today for surgery, with the diagnosis of afib  The various methods of treatment have been discussed with the patient and family. After consideration of risks, benefits and other options for treatment, the patient has consented to  Procedure(s): TRANSESOPHAGEAL ECHOCARDIOGRAM (TEE) (N/A) as a surgical intervention .  The patient's history has been reviewed, patient examined, no change in status, stable for surgery.  I have reviewed the patient's chart and labs.  Questions were answered to the patient's satisfaction.     Fransico Him

## 2018-06-23 NOTE — CV Procedure (Signed)
    PROCEDURE NOTE:  Procedure:  Transesophageal echocardiogram Operator:  Fransico Him, MD Indications:  CVA Complications: None  During this procedure the patient is administered a total of Propofol 135 mg to achieve and maintain moderate conscious sedation.  The patient's heart rate, blood pressure, and oxygen saturation are monitored continuously during the procedure by anesthesia.   Results: Normal LV size and function with EF 50-55% Normal RV size and function Normal RA Moderately dilated LA with mild spontaneous echo contrast in the LA and LAA with no evidence of thrombus and normal emptying velocity in LAA. Normal TV with mild TR Normal PV with mild PR Normal MV with possible very small ruptured chordae tendinae and mild MR Normal trileaflet AV with trivial eccentric AR Normal interatrial septum with no evidence of shunt by colorflow dopper and agitate saline contrast study Normal thoracic and ascending aorta.  The patient tolerated the procedure well and was transferred back to their room in stable condition.  Signed: Fransico Him, MD [facility]

## 2018-06-24 ENCOUNTER — Encounter (HOSPITAL_COMMUNITY): Payer: Self-pay | Admitting: Cardiology

## 2018-06-24 NOTE — Anesthesia Postprocedure Evaluation (Signed)
Anesthesia Post Note  Patient: Mikayla Reynolds  Procedure(s) Performed: TRANSESOPHAGEAL ECHOCARDIOGRAM (TEE) (N/A ) BUBBLE STUDY LOOP RECORDER IMPLANT     Patient location during evaluation: PACU Anesthesia Type: MAC Level of consciousness: awake and alert Pain management: pain level controlled Vital Signs Assessment: post-procedure vital signs reviewed and stable Respiratory status: spontaneous breathing, nonlabored ventilation, respiratory function stable and patient connected to nasal cannula oxygen Cardiovascular status: stable and blood pressure returned to baseline Postop Assessment: no apparent nausea or vomiting Anesthetic complications: no    Last Vitals:  Vitals:   06/23/18 1150 06/23/18 1200  BP: 121/62 119/60  Pulse: (!) 54 (!) 54  Resp: 18 18  Temp:    SpO2: 100% 100%    Last Pain:  Vitals:   06/23/18 1200  TempSrc:   PainSc: 0-No pain                 Etoy Mcdonnell P Mazzy Santarelli

## 2018-06-25 ENCOUNTER — Telehealth: Payer: Self-pay | Admitting: *Deleted

## 2018-06-25 NOTE — Telephone Encounter (Signed)
Spoke with patient's granddaughter (DPR) to request manual transmission for review. Gave transmission instructions and direct DC phone number. She agrees to send transmission.  Received alert for 2 "AF" episodes--available ECG shows SR w/PACs. Will review additional ECG when transmission received.

## 2018-06-29 NOTE — Telephone Encounter (Signed)
Manual transmission received and reviewed. 8 "AF" episodes appear SR with PACs. Will place in Dr. Jackalyn Lombard folder for review.

## 2018-07-02 ENCOUNTER — Ambulatory Visit (INDEPENDENT_AMBULATORY_CARE_PROVIDER_SITE_OTHER): Payer: Medicaid Other | Admitting: *Deleted

## 2018-07-02 DIAGNOSIS — I639 Cerebral infarction, unspecified: Secondary | ICD-10-CM

## 2018-07-02 LAB — CUP PACEART INCLINIC DEVICE CHECK
Implantable Pulse Generator Implant Date: 20191022
MDC IDC SESS DTM: 20191031164951

## 2018-07-02 MED FILL — ATORVASTATIN 40 MG TABLET: 40 | 30 days supply | Qty: 30 | Fill #1

## 2018-07-02 NOTE — Progress Notes (Addendum)
Wound check appointment. Steri-strips removed. Wound without redness or edema. Incision edges approximated, wound well healed. Normal device function. Battery status: good. R-waves 0.53mV. Pause and brady detection off at implant. No symptom or tachy episodes. 25 AF episodes--false, ECGs show SR w/ectopy. Per GT, reprogrammed AT/AF recording threshold to episodes >/= 27min. Patient educated about wound care and Carelink monitor via interpreter. Monthly summary reports and ROV with JA PRN.

## 2018-07-27 ENCOUNTER — Ambulatory Visit (INDEPENDENT_AMBULATORY_CARE_PROVIDER_SITE_OTHER): Payer: Medicaid Other

## 2018-07-27 DIAGNOSIS — I639 Cerebral infarction, unspecified: Secondary | ICD-10-CM

## 2018-07-27 NOTE — Progress Notes (Signed)
Carelink Summary Report / Loop Recorder 

## 2018-08-06 MED FILL — POLYETHYLENE GLYCOL 3350 PO: 30 days supply | Qty: 238 | Fill #1

## 2018-08-06 MED FILL — ATORVASTATIN CALCIUM 40 MG: 40 | 30 days supply | Qty: 30 | Fill #2

## 2018-08-17 ENCOUNTER — Other Ambulatory Visit: Payer: Self-pay | Admitting: Internal Medicine

## 2018-08-28 ENCOUNTER — Ambulatory Visit (INDEPENDENT_AMBULATORY_CARE_PROVIDER_SITE_OTHER): Payer: Medicaid Other

## 2018-08-28 DIAGNOSIS — I639 Cerebral infarction, unspecified: Secondary | ICD-10-CM

## 2018-08-30 LAB — CUP PACEART REMOTE DEVICE CHECK
Implantable Pulse Generator Implant Date: 20191022
MDC IDC SESS DTM: 20191227153953

## 2018-08-31 ENCOUNTER — Other Ambulatory Visit: Payer: Self-pay | Admitting: Internal Medicine

## 2018-08-31 NOTE — Progress Notes (Signed)
Carelink Summary Report / Loop Recorder 

## 2018-09-07 MED FILL — ATORVASTATIN CALCIUM 40 MG: 40 | 30 days supply | Qty: 30 | Fill #3

## 2018-09-13 LAB — CUP PACEART REMOTE DEVICE CHECK
Date Time Interrogation Session: 20191124153848
Implantable Pulse Generator Implant Date: 20191022

## 2018-09-17 ENCOUNTER — Ambulatory Visit: Payer: Medicaid Other | Attending: Internal Medicine | Admitting: Internal Medicine

## 2018-09-17 ENCOUNTER — Encounter: Payer: Self-pay | Admitting: Internal Medicine

## 2018-09-17 VITALS — BP 120/68 | HR 63 | Temp 97.6°F | Resp 16 | Ht 64.0 in | Wt 139.0 lb

## 2018-09-17 DIAGNOSIS — I129 Hypertensive chronic kidney disease with stage 1 through stage 4 chronic kidney disease, or unspecified chronic kidney disease: Secondary | ICD-10-CM | POA: Insufficient documentation

## 2018-09-17 DIAGNOSIS — R945 Abnormal results of liver function studies: Secondary | ICD-10-CM | POA: Diagnosis not present

## 2018-09-17 DIAGNOSIS — Z7982 Long term (current) use of aspirin: Secondary | ICD-10-CM | POA: Insufficient documentation

## 2018-09-17 DIAGNOSIS — Z79899 Other long term (current) drug therapy: Secondary | ICD-10-CM | POA: Diagnosis not present

## 2018-09-17 DIAGNOSIS — E1122 Type 2 diabetes mellitus with diabetic chronic kidney disease: Secondary | ICD-10-CM | POA: Insufficient documentation

## 2018-09-17 DIAGNOSIS — D539 Nutritional anemia, unspecified: Secondary | ICD-10-CM | POA: Insufficient documentation

## 2018-09-17 DIAGNOSIS — N183 Chronic kidney disease, stage 3 unspecified: Secondary | ICD-10-CM

## 2018-09-17 DIAGNOSIS — Z8673 Personal history of transient ischemic attack (TIA), and cerebral infarction without residual deficits: Secondary | ICD-10-CM

## 2018-09-17 DIAGNOSIS — R5383 Other fatigue: Secondary | ICD-10-CM

## 2018-09-17 DIAGNOSIS — R7989 Other specified abnormal findings of blood chemistry: Secondary | ICD-10-CM

## 2018-09-17 DIAGNOSIS — R140 Abdominal distension (gaseous): Secondary | ICD-10-CM

## 2018-09-17 DIAGNOSIS — I639 Cerebral infarction, unspecified: Secondary | ICD-10-CM

## 2018-09-17 NOTE — Progress Notes (Addendum)
Patient ID: Mikayla Reynolds, female    DOB: Aug 13, 1941  MRN: [id number]  CC: Hypertension and Fatigue   Subjective: Mikayla Reynolds is a 78 y.o. female who presents for chronic ds  Son, Lance Bosch, is with her and interprets.  Her concerns today include:  HTN, CVA RT MCA, pre-DM, CKD stage 3   Hx of CVA RT MCA territory: Since last visit with me patient had loop recorder inserted.  She continues to take aspirin and atorvastatin.  She was on Candesartan on last visit with me but apparently she is no longer taking as she only has a bottle of aspirin and atorvastatin with her today.  C/o feeling bloating after meals x 1 wk She denies any abdominal pain, no blood in stools.  No diarrhea No worse with any particular foods  CKD: Makes good urine.  She is not on any NSAIDs. Patient Active Problem List   Diagnosis Date Noted  . CKD (chronic kidney disease), stage III ([place]) 02/19/2018  . Stroke ([place]) 12/27/2017  . Elevated troponin 12/27/2017  . Chest pain 12/27/2017  . Essential hypertension      Current Outpatient Medications on File Prior to Visit  Medication Sig Dispense Refill  . aspirin EC 81 MG tablet Take 1 tablet (81 mg total) by mouth daily. 100 tablet 1  . atorvastatin (LIPITOR) 40 MG tablet Take 1 tablet (40 mg total) by mouth daily at 6 PM. 30 tablet 11  . polyethylene glycol powder (GLYCOLAX/MIRALAX) powder Take 17 g by mouth 3 (three) times a week. (Patient not taking: Reported on 07/02/2018) 3350 g 1  . PRESCRIPTION MEDICATION Take 20 mg by mouth See admin instructions. "Vastarel" 20 mg -trimetazidine dihydrochloride - from [place] pharmacy/ take one tablet (20 mg) twice daily    . PRESCRIPTION MEDICATION Take 4 mg by mouth See admin instructions. Candesartan 8 mg - from [place] pharmacy - take 1/2 tablet (4 mg) by mouth every morning    . PRESCRIPTION MEDICATION Take 0.25 mg by mouth See admin instructions. Alprazolam 0.5 mg - from [place] pharmacy/ take 1/2 tablet (0.25 mg) by  mouth daily at bedtime     No current facility-administered medications on file prior to visit.     No Known Allergies  Social History   Socioeconomic History  . Marital status: Widowed    Spouse name: Not on file  . Number of children: Not on file  . Years of education: Not on file  . Highest education level: Not on file  Occupational History  . Not on file  Social Needs  . Financial resource strain: Not on file  . Food insecurity:    Worry: Not on file    Inability: Not on file  . Transportation needs:    Medical: Not on file    Non-medical: Not on file  Tobacco Use  . Smoking status: Never Smoker  . Smokeless tobacco: Never Used  Substance and Sexual Activity  . Alcohol use: Not Currently  . Drug use: Not Currently  . Sexual activity: Not on file  Lifestyle  . Physical activity:    Days per week: Not on file    Minutes per session: Not on file  . Stress: Not on file  Relationships  . Social connections:    Talks on phone: Not on file    Gets together: Not on file    Attends religious service: Not on file    Active member of club or organization: Not on file  Attends meetings of clubs or organizations: Not on file    Relationship status: Not on file  . Intimate partner violence:    Fear of current or ex partner: Not on file    Emotionally abused: Not on file    Physically abused: Not on file    Forced sexual activity: Not on file  Other Topics Concern  . Not on file  Social History Narrative  . Not on file    No family history on file.  Past Surgical History:  Procedure Laterality Date  . APPENDECTOMY    . LOOP RECORDER INSERTION N/A 06/23/2018   Procedure: LOOP RECORDER INSERTION;  Surgeon: [name], MD;  Location: [place] [facility];  Service: Cardiovascular;  Laterality: N/A;  . TEE WITHOUT CARDIOVERSION N/A 06/23/2018   Procedure: TRANSESOPHAGEAL ECHOCARDIOGRAM (TEE);  Surgeon: [name], MD;  Location: [facility] ENDOSCOPY;  Service:  Cardiovascular;  Laterality: N/A;    ROS: Review of Systems Neg except as above  PHYSICAL EXAM: BP 120/68   Pulse 63   Temp 97.6 F (36.4 C) (Oral)   Resp 16   Ht '5\' 4"'$  (1.626 m)   Wt 139 lb (63 kg)   SpO2 100%   BMI 23.86 kg/m   Wt Readings from Last 3 Encounters:  09/17/18 139 lb (63 kg)  06/23/18 134 lb 7.7 oz (61 kg)  06/22/18 135 lb (61.2 kg)    BP 120/68  Physical Exam  General appearance - alert, well appearing, and in no distress Mental status - normal mood, behavior, speech, dress, motor activity, and thought processes Mouth - mucous membranes moist, pharynx normal without lesions Neck - supple, no significant adenopathy Chest - clear to auscultation, no wheezes, rales or rhonchi, symmetric air entry Heart - normal rate, regular rhythm, normal S1, S2, no murmurs, rubs, clicks or gallops Abdomen - soft, nontender, nondistended, no masses or organomegaly Extremities - trace LE edema  Lab Results  Component Value Date   HGBA1C 5.7 05/22/2018     ASSESSMENT AND PLAN:  1. Abdominal bloating -Advised to pay attention to any particular types of foods that may make bloating worse and try to avoid those foods.  She can try drinking a can of diet ginger ale or trying some Maalox as needed.  2. Cerebrovascular accident (CVA), unspecified mechanism ([place]) Continue aspirin and atorvastatin.  Blood pressure is under pretty good control.  I question whether she still has some Candesartan  at home and is taking.  When I saw her last time she still had quite a bit of this medication from [place] -She now has a loop recorder 3. CKD (chronic kidney disease), stage III ([facility]) - CBC - Comprehensive metabolic panel   Patient was given the opportunity to ask questions.  Patient verbalized understanding of the plan and was able to repeat key elements of the plan.   Addendum:  Pt with macrocytic anemia and mild elevation in total bili and ALK phos.  Additional testing  requested. Orders Placed This Encounter  Procedures  . CBC  . Comprehensive metabolic panel     Requested Prescriptions    No prescriptions requested or ordered in this encounter    Return in about 5 months (around 02/16/2019).  Karle Plumber, MD, FACP

## 2018-09-18 LAB — COMPREHENSIVE METABOLIC PANEL
A/G RATIO: 1.2 (ref 1.2–2.2)
ALBUMIN: 4.2 g/dL (ref 3.5–4.8)
ALK PHOS: 135 IU/L — AB (ref 39–117)
ALT: 15 IU/L (ref 0–32)
AST: 37 IU/L (ref 0–40)
BILIRUBIN TOTAL: 1.6 mg/dL — AB (ref 0.0–1.2)
BUN / CREAT RATIO: 12 (ref 12–28)
BUN: 13 mg/dL (ref 8–27)
CHLORIDE: 104 mmol/L (ref 96–106)
CO2: 22 mmol/L (ref 20–29)
Calcium: 8.9 mg/dL (ref 8.7–10.3)
Creatinine, Ser: 1.1 mg/dL — ABNORMAL HIGH (ref 0.57–1.00)
GFR calc Af Amer: 56 mL/min/{1.73_m2} — ABNORMAL LOW (ref >59)
GFR calc non Af Amer: 49 mL/min/{1.73_m2} — ABNORMAL LOW (ref >59)
GLOBULIN, TOTAL: 3.4 g/dL (ref 1.5–4.5)
Glucose: 91 mg/dL (ref 65–99)
POTASSIUM: 3.7 mmol/L (ref 3.5–5.2)
SODIUM: 141 mmol/L (ref 134–144)
Total Protein: 7.6 g/dL (ref 6.0–8.5)

## 2018-09-18 LAB — CBC
HEMATOCRIT: 35.2 % (ref 34.0–46.6)
Hemoglobin: 11.5 g/dL (ref 11.1–15.9)
MCH: 32.9 pg (ref 26.6–33.0)
MCHC: 32.7 g/dL (ref 31.5–35.7)
MCV: 101 fL — ABNORMAL HIGH (ref 79–97)
PLATELETS: 110 10*3/uL — AB (ref 150–450)
RBC: 3.5 x10E6/uL — ABNORMAL LOW (ref 3.77–5.28)
RDW: 12 % (ref 11.7–15.4)
WBC: 3.8 10*3/uL (ref 3.4–10.8)

## 2018-09-19 NOTE — Addendum Note (Signed)
Addended by: Karle Plumber B on: 09/19/2018 09:46 AM   Modules accepted: Orders

## 2018-09-30 ENCOUNTER — Ambulatory Visit (INDEPENDENT_AMBULATORY_CARE_PROVIDER_SITE_OTHER): Payer: Medicaid Other

## 2018-09-30 DIAGNOSIS — I639 Cerebral infarction, unspecified: Secondary | ICD-10-CM

## 2018-10-01 NOTE — Progress Notes (Signed)
Carelink Summary Report / Loop Recorder 

## 2018-10-02 LAB — CUP PACEART REMOTE DEVICE CHECK
Date Time Interrogation Session: 20200129164044
Implantable Pulse Generator Implant Date: 20191022

## 2018-10-12 MED FILL — ATORVASTATIN CALCIUM 40 MG: 40 | 30 days supply | Qty: 30 | Fill #4

## 2018-11-02 ENCOUNTER — Ambulatory Visit (INDEPENDENT_AMBULATORY_CARE_PROVIDER_SITE_OTHER): Payer: Medicaid Other | Admitting: *Deleted

## 2018-11-02 DIAGNOSIS — I639 Cerebral infarction, unspecified: Secondary | ICD-10-CM

## 2018-11-02 LAB — CUP PACEART REMOTE DEVICE CHECK
Date Time Interrogation Session: 20200302144423
MDC IDC PG IMPLANT DT: 20191022

## 2018-11-10 NOTE — Progress Notes (Signed)
Carelink Summary Report / Loop Recorder 

## 2018-11-16 MED FILL — ATORVASTATIN CALCIUM 40 MG: 40 | 30 days supply | Qty: 30 | Fill #5

## 2018-12-07 ENCOUNTER — Other Ambulatory Visit: Payer: Self-pay

## 2018-12-07 ENCOUNTER — Ambulatory Visit (INDEPENDENT_AMBULATORY_CARE_PROVIDER_SITE_OTHER): Payer: Medicaid Other | Admitting: *Deleted

## 2018-12-07 DIAGNOSIS — I639 Cerebral infarction, unspecified: Secondary | ICD-10-CM

## 2018-12-07 LAB — CUP PACEART REMOTE DEVICE CHECK
Date Time Interrogation Session: 20200404181058
Implantable Pulse Generator Implant Date: 20191022

## 2018-12-16 ENCOUNTER — Telehealth: Payer: Self-pay

## 2018-12-16 NOTE — Progress Notes (Signed)
Carelink Summary Report / Loop Recorder 

## 2018-12-16 NOTE — Telephone Encounter (Addendum)
I called pts listed number and spoke with pts granddaughter Seiple,THU.about appt being change to video visit due to COVID 19. I ask if she live with the pt. She stated they do not live in the same household. The pt lives with her son. The pt son speaks little english and both speak vietnamese. She also stated they dont have a smart phone. She request appt to be r/s. The granddaughter stated pt is stable and doing well. Also at the next visit to schedule an interpreter. Appt change for 06/2019 and granddaughter has new date and time.She verbalized understanding.

## 2018-12-16 NOTE — Telephone Encounter (Signed)
Revised. 

## 2018-12-22 ENCOUNTER — Ambulatory Visit: Payer: Medicaid Other | Admitting: Adult Health

## 2018-12-23 MED FILL — POLYETHYLENE GLYCOL 3350 PO: 17 | 30 days supply | Qty: 238 | Fill #2

## 2018-12-23 MED FILL — ATORVASTATIN CALCIUM 40 MG: 40 | 30 days supply | Qty: 30 | Fill #6

## 2019-01-07 ENCOUNTER — Ambulatory Visit (INDEPENDENT_AMBULATORY_CARE_PROVIDER_SITE_OTHER): Payer: Medicaid Other | Admitting: *Deleted

## 2019-01-07 ENCOUNTER — Other Ambulatory Visit: Payer: Self-pay

## 2019-01-07 DIAGNOSIS — I639 Cerebral infarction, unspecified: Secondary | ICD-10-CM

## 2019-01-08 LAB — CUP PACEART REMOTE DEVICE CHECK
Date Time Interrogation Session: 20200507183823
Implantable Pulse Generator Implant Date: 20191022

## 2019-01-12 NOTE — Progress Notes (Signed)
Carelink Summary Report / Loop Recorder 

## 2019-02-02 MED FILL — ATORVASTATIN CALCIUM 40 MG: 40 | 30 days supply | Qty: 30 | Fill #7

## 2019-02-09 ENCOUNTER — Ambulatory Visit (INDEPENDENT_AMBULATORY_CARE_PROVIDER_SITE_OTHER): Payer: Medicaid Other | Admitting: *Deleted

## 2019-02-09 DIAGNOSIS — I639 Cerebral infarction, unspecified: Secondary | ICD-10-CM | POA: Diagnosis not present

## 2019-02-09 LAB — CUP PACEART REMOTE DEVICE CHECK
Date Time Interrogation Session: 20200609165314
Implantable Pulse Generator Implant Date: 20191022

## 2019-02-10 MED FILL — POLYETHYLENE GLYCOL 3350 PO: 17 | 30 days supply | Qty: 238 | Fill #3

## 2019-02-16 ENCOUNTER — Ambulatory Visit: Payer: Medicaid Other | Admitting: Internal Medicine

## 2019-02-18 NOTE — Progress Notes (Signed)
Carelink Summary Report / Loop Recorder 

## 2019-03-11 ENCOUNTER — Encounter: Payer: Self-pay | Admitting: Internal Medicine

## 2019-03-11 ENCOUNTER — Other Ambulatory Visit: Payer: Self-pay

## 2019-03-11 ENCOUNTER — Ambulatory Visit: Payer: Medicaid Other | Attending: Internal Medicine | Admitting: Internal Medicine

## 2019-03-11 VITALS — BP 137/79 | HR 59 | Temp 98.2°F | Resp 18 | Ht 63.0 in | Wt 126.0 lb

## 2019-03-11 DIAGNOSIS — I129 Hypertensive chronic kidney disease with stage 1 through stage 4 chronic kidney disease, or unspecified chronic kidney disease: Secondary | ICD-10-CM | POA: Insufficient documentation

## 2019-03-11 DIAGNOSIS — Z6822 Body mass index (BMI) 22.0-22.9, adult: Secondary | ICD-10-CM | POA: Insufficient documentation

## 2019-03-11 DIAGNOSIS — Z8673 Personal history of transient ischemic attack (TIA), and cerebral infarction without residual deficits: Secondary | ICD-10-CM | POA: Insufficient documentation

## 2019-03-11 DIAGNOSIS — R140 Abdominal distension (gaseous): Secondary | ICD-10-CM | POA: Diagnosis not present

## 2019-03-11 DIAGNOSIS — R7303 Prediabetes: Secondary | ICD-10-CM | POA: Diagnosis not present

## 2019-03-11 DIAGNOSIS — Z79899 Other long term (current) drug therapy: Secondary | ICD-10-CM | POA: Insufficient documentation

## 2019-03-11 DIAGNOSIS — R160 Hepatomegaly, not elsewhere classified: Secondary | ICD-10-CM | POA: Insufficient documentation

## 2019-03-11 DIAGNOSIS — R634 Abnormal weight loss: Secondary | ICD-10-CM | POA: Diagnosis not present

## 2019-03-11 DIAGNOSIS — R7989 Other specified abnormal findings of blood chemistry: Secondary | ICD-10-CM

## 2019-03-11 DIAGNOSIS — I639 Cerebral infarction, unspecified: Secondary | ICD-10-CM | POA: Diagnosis not present

## 2019-03-11 DIAGNOSIS — K5900 Constipation, unspecified: Secondary | ICD-10-CM | POA: Diagnosis not present

## 2019-03-11 DIAGNOSIS — Z9889 Other specified postprocedural states: Secondary | ICD-10-CM | POA: Insufficient documentation

## 2019-03-11 DIAGNOSIS — N183 Chronic kidney disease, stage 3 unspecified: Secondary | ICD-10-CM

## 2019-03-11 DIAGNOSIS — R945 Abnormal results of liver function studies: Secondary | ICD-10-CM | POA: Diagnosis not present

## 2019-03-11 DIAGNOSIS — I10 Essential (primary) hypertension: Secondary | ICD-10-CM | POA: Diagnosis present

## 2019-03-11 DIAGNOSIS — D539 Nutritional anemia, unspecified: Secondary | ICD-10-CM | POA: Diagnosis not present

## 2019-03-11 DIAGNOSIS — Z7982 Long term (current) use of aspirin: Secondary | ICD-10-CM | POA: Insufficient documentation

## 2019-03-11 MED ORDER — SENNOSIDES-DOCUSATE SODIUM 8.6-50 MG PO TABS: 1.0000 | ORAL_TABLET | Freq: Every day | ORAL | 2 refills | Status: DC

## 2019-03-11 MED ORDER — ATORVASTATIN CALCIUM 40 MG PO TABS: 40.0000 mg | ORAL_TABLET | Freq: Every day | ORAL | 11 refills | Status: DC

## 2019-03-11 MED FILL — ATORVASTATIN CALCIUM 40 MG: 40 | 30 days supply | Qty: 30 | Fill #0

## 2019-03-11 NOTE — Progress Notes (Signed)
Patient ID: Mikayla Reynolds, female    DOB: 1940-12-11  MRN: [id number]  CC: Chronic disease management  Subjective: Mikayla Reynolds is a 78 y.o. female who presents for chronic ds managementt Her concerns today include:  HTN, CVART MCA loop recorder in place, pre-DM, CKD stage 3.  GD Mikayla Reynolds is with her and interprets.  Pt had some abnormal labs on last visit. Did received lab letter Hx of CKD.  Worsened on last Chem.  She is not using any over-the-counter NSAIDs CBC with macrocytic anemia and low platelet count  CVA:  Out of Lipitor x 1 wk. needs refill Compliant with taking aspirin  HTN:  Takes a medication from [place] but not every day.  This is the only med tha she still takes that was given in [place] and she does not recall the name.  One time she had a bottle of candesartan Last took it 2 days ago Limits salt in foods No CP/SOB/LE edema but feels she lacks energy to move around.    Still having a lot of bloating in stomach especially after dinner.  Constant uncomforable feeling.  Not pain.  No N/V.   Taking MOM for constipation once a wk then able to move bowel every day once she takes it. If she does not take it, she feels she wants to have BM but is unable to go.   Poor appetite, has had wgh loss, no blood in stools .  Denies problems swallowing Reports having had abdominal surgery many years ago for what sounds like a ruptured appendix  Patient Active Problem List   Diagnosis Date Noted  . History of loop recorder 03/11/2019  . CKD (chronic kidney disease), stage III ([place]) 02/19/2018  . Stroke ([place]) 12/27/2017  . Elevated troponin 12/27/2017  . Chest pain 12/27/2017  . Essential hypertension      Current Outpatient Medications on File Prior to Visit  Medication Sig Dispense Refill  . aspirin EC 81 MG tablet Take 1 tablet (81 mg total) by mouth daily. 100 tablet 1  . atorvastatin (LIPITOR) 40 MG tablet Take 1 tablet (40 mg total) by mouth daily at 6 PM. 30 tablet 11   . magnesium hydroxide (MILK OF MAGNESIA) 400 MG/5ML suspension Take 5 mLs by mouth daily as needed for mild constipation.    . polyethylene glycol powder (GLYCOLAX/MIRALAX) powder Take 17 g by mouth 3 (three) times a week. (Patient not taking: Reported on 07/02/2018) 3350 g 1  . PRESCRIPTION MEDICATION Take 20 mg by mouth See admin instructions. "Vastarel" 20 mg -trimetazidine dihydrochloride - from [place] pharmacy/ take one tablet (20 mg) twice daily    . PRESCRIPTION MEDICATION Take 4 mg by mouth See admin instructions. Candesartan 8 mg - from [place] pharmacy - take 1/2 tablet (4 mg) by mouth every morning    . PRESCRIPTION MEDICATION Take 0.25 mg by mouth See admin instructions. Alprazolam 0.5 mg - from [place] pharmacy/ take 1/2 tablet (0.25 mg) by mouth daily at bedtime     No current facility-administered medications on file prior to visit.     No Known Allergies  Social History   Socioeconomic History  . Marital status: Widowed    Spouse name: Not on file  . Number of children: Not on file  . Years of education: Not on file  . Highest education level: Not on file  Occupational History  . Not on file  Social Needs  . Financial resource strain: Not on file  . Food  insecurity    Worry: Not on file    Inability: Not on file  . Transportation needs    Medical: Not on file    Non-medical: Not on file  Tobacco Use  . Smoking status: Never Smoker  . Smokeless tobacco: Never Used  Substance and Sexual Activity  . Alcohol use: Not Currently  . Drug use: Not Currently  . Sexual activity: Not Currently  Lifestyle  . Physical activity    Days per week: Not on file    Minutes per session: Not on file  . Stress: Not on file  Relationships  . Social Herbalist on phone: Not on file    Gets together: Not on file    Attends religious service: Not on file    Active member of club or organization: Not on file    Attends meetings of clubs or organizations: Not on file     Relationship status: Not on file  . Intimate partner violence    Fear of current or ex partner: Not on file    Emotionally abused: Not on file    Physically abused: Not on file    Forced sexual activity: Not on file  Other Topics Concern  . Not on file  Social History Narrative  . Not on file    History reviewed. No pertinent family history.  Past Surgical History:  Procedure Laterality Date  . APPENDECTOMY    . LOOP RECORDER INSERTION N/A 06/23/2018   Procedure: LOOP RECORDER INSERTION;  Surgeon: Mikayla Grayer, MD;  Location: [place] [facility];  Service: Cardiovascular;  Laterality: N/A;  . TEE WITHOUT CARDIOVERSION N/A 06/23/2018   Procedure: TRANSESOPHAGEAL ECHOCARDIOGRAM (TEE);  Surgeon: Mikayla Margarita, MD;  Location: [facility] ENDOSCOPY;  Service: Cardiovascular;  Laterality: N/A;    ROS: Review of Systems Negative except as stated above  PHYSICAL EXAM: BP 137/79 (BP Location: Left Arm, Patient Position: Sitting, Cuff Size: Normal)   Pulse (!) 59   Temp 98.2 F (36.8 C) (Oral)   Resp 18   Ht 5\' 3"  (1.6 m)   Wt 126 lb (57.2 kg)   SpO2 98%   BMI 22.32 kg/m   Wt Readings from Last 3 Encounters:  03/11/19 126 lb (57.2 kg)  09/17/18 139 lb (63 kg)  06/23/18 134 lb 7.7 oz (61 kg)   Physical Exam General appearance - alert, well appearing, pleasant elderly female and in no distress Mental status - normal mood, behavior, speech, dress, motor activity, and thought processes Mouth - mucous membranes moist, pharynx normal without lesions Neck - supple, no significant adenopathy Chest - clear to auscultation, no wheezes, rales or rhonchi, symmetric air entry Heart - normal rate, regular rhythm, normal S1, S2, no murmurs, rubs, clicks or gallops Abdomen -normal bowel sounds, soft, nondistended.  Fullness and dullness to percussion about 10 cm or more below the right costophrenic angle suggesting hepatomegaly  Rectal exam: She had a firm ball of stool felt in the rectum.  Stool  was yellowish looking.  Hemoccult negative. extremities -trace lower extremity edema   CMP Latest Ref Rng & Units 09/17/2018 01/14/2018 12/29/2017  Glucose 65 - 99 mg/dL 91 101(H) 81  BUN 8 - 27 mg/dL 13 22 10   Creatinine 0.57 - 1.00 mg/dL 1.10(H) 0.99 1.05(H)  Sodium 134 - 144 mmol/L 141 136 136  Potassium 3.5 - 5.2 mmol/L 3.7 4.6 3.7  Chloride 96 - 106 mmol/L 104 99 106  CO2 20 - 29 mmol/L 22 23  23  Calcium 8.7 - 10.3 mg/dL 8.9 9.2 8.6(L)  Total Protein 6.0 - 8.5 g/dL 7.6 - -  Total Bilirubin 0.0 - 1.2 mg/dL 1.6(H) - -  Alkaline Phos 39 - 117 IU/L 135(H) - -  AST 0 - 40 IU/L 37 - -  ALT 0 - 32 IU/L 15 - -   Lipid Panel     Component Value Date/Time   CHOL 170 12/28/2017 0529   TRIG 76 12/28/2017 0529   HDL 35 (L) 12/28/2017 0529   CHOLHDL 4.9 12/28/2017 0529   VLDL 15 12/28/2017 0529   LDLCALC 120 (H) 12/28/2017 0529    CBC    Component Value Date/Time   WBC 3.8 09/17/2018 1633   WBC 5.3 12/28/2017 0529   RBC 3.50 (L) 09/17/2018 1633   RBC 3.90 12/28/2017 0529   HGB 11.5 09/17/2018 1633   HCT 35.2 09/17/2018 1633   PLT 110 (L) 09/17/2018 1633   MCV 101 (H) 09/17/2018 1633   MCH 32.9 09/17/2018 1633   MCH 31.3 12/28/2017 0529   MCHC 32.7 09/17/2018 1633   MCHC 32.9 12/28/2017 0529   RDW 12.0 09/17/2018 1633   LYMPHSABS 2.1 12/27/2017 1252   MONOABS 0.5 12/27/2017 1252   EOSABS 0.0 12/27/2017 1252   BASOSABS 0.0 12/27/2017 1252    ASSESSMENT AND PLAN: 1. Abdominal bloating - DG Abd 2 Views; Future - H. pylori breath test - Ambulatory referral to Gastroenterology  2. Constipation, unspecified constipation type Advised to stop milk of magnesia given CKD - DG Abd 2 Views; Future - senna-docusate (SENOKOT-S) 8.6-50 MG tablet; Take 1 tablet by mouth daily. For constipation  Dispense: 30 tablet; Refill: 2  3. Hepatomegaly - US Abdomen Limited; Future - Hepatitis C Antibody - Hepatitis B surface antigen - Ambulatory referral to Gastroenterology  4. Weight  loss, unintentional - TSH - Ambulatory referral to Gastroenterology  5. Cerebrovascular accident (CVA), unspecified mechanism ([place]) - atorvastatin (LIPITOR) 40 MG tablet; Take 1 tablet (40 mg total) by mouth daily at 6 PM.  Dispense: 30 tablet; Refill: 11  6. CKD (chronic kidney disease), stage III ([place]) Advised not to take any over-the-counter pain medications.  Asked to bring in her blood pressure medication that she is taking on next visit - Comprehensive metabolic panel - Microalbumin / creatinine urine ratio  7. Abnormal LFTs - Comprehensive metabolic panel - US Abdomen Limited; Future - Hepatitis C Antibody - Hepatitis B surface antigen - Ambulatory referral to Gastroenterology  8. Macrocytic anemia - Iron, TIBC and Ferritin Panel - Vitamin B12 - CBC with Differential/Platelet - Folate  9. Prediabetes - Hemoglobin A1c     Patient was given the opportunity to ask questions.  Patient verbalized understanding of the plan and was able to repeat key elements of the plan.   No orders of the defined types were placed in this encounter.    Requested Prescriptions    No prescriptions requested or ordered in this encounter    No follow-ups on file.  Karle Plumber, MD, FACP

## 2019-03-12 ENCOUNTER — Encounter: Payer: Self-pay | Admitting: Gastroenterology

## 2019-03-12 LAB — CBC WITH DIFFERENTIAL/PLATELET
Basophils Absolute: 0.1 10*3/uL (ref 0.0–0.2)
Basos: 2 %
EOS (ABSOLUTE): 0.1 10*3/uL (ref 0.0–0.4)
Eos: 2 %
Hematocrit: 33.5 % — ABNORMAL LOW (ref 34.0–46.6)
Hemoglobin: 11.6 g/dL (ref 11.1–15.9)
Immature Grans (Abs): 0 10*3/uL (ref 0.0–0.1)
Immature Granulocytes: 0 %
Lymphocytes Absolute: 0.7 10*3/uL (ref 0.7–3.1)
Lymphs: 20 %
MCH: 34 pg — ABNORMAL HIGH (ref 26.6–33.0)
MCHC: 34.6 g/dL (ref 31.5–35.7)
MCV: 98 fL — ABNORMAL HIGH (ref 79–97)
Monocytes Absolute: 0.4 10*3/uL (ref 0.1–0.9)
Monocytes: 13 %
Neutrophils Absolute: 2.1 10*3/uL (ref 1.4–7.0)
Neutrophils: 63 %
Platelets: 159 10*3/uL (ref 150–450)
RBC: 3.41 x10E6/uL — ABNORMAL LOW (ref 3.77–5.28)
RDW: 13.7 % (ref 11.7–15.4)
WBC: 3.3 10*3/uL — ABNORMAL LOW (ref 3.4–10.8)

## 2019-03-12 LAB — COMPREHENSIVE METABOLIC PANEL
ALT: 11 IU/L (ref 0–32)
AST: 35 IU/L (ref 0–40)
Albumin/Globulin Ratio: 0.9 — ABNORMAL LOW (ref 1.2–2.2)
Albumin: 4 g/dL (ref 3.7–4.7)
Alkaline Phosphatase: 253 IU/L — ABNORMAL HIGH (ref 39–117)
BUN/Creatinine Ratio: 14 (ref 12–28)
BUN: 17 mg/dL (ref 8–27)
Bilirubin Total: 3.1 mg/dL — ABNORMAL HIGH (ref 0.0–1.2)
CO2: 23 mmol/L (ref 20–29)
Calcium: 9.4 mg/dL (ref 8.7–10.3)
Chloride: 100 mmol/L (ref 96–106)
Creatinine, Ser: 1.22 mg/dL — ABNORMAL HIGH (ref 0.57–1.00)
GFR calc Af Amer: 50 mL/min/{1.73_m2} — ABNORMAL LOW (ref >59)
GFR calc non Af Amer: 43 mL/min/{1.73_m2} — ABNORMAL LOW (ref >59)
Globulin, Total: 4.7 g/dL — ABNORMAL HIGH (ref 1.5–4.5)
Glucose: 80 mg/dL (ref 65–99)
Potassium: 4.8 mmol/L (ref 3.5–5.2)
Sodium: 137 mmol/L (ref 134–144)
Total Protein: 8.7 g/dL — ABNORMAL HIGH (ref 6.0–8.5)

## 2019-03-12 LAB — IRON,TIBC AND FERRITIN PANEL
Ferritin: 684 ng/mL — ABNORMAL HIGH (ref 15–150)
Iron Saturation: 32 % (ref 15–55)
Iron: 83 ug/dL (ref 27–139)
Total Iron Binding Capacity: 257 ug/dL (ref 250–450)
UIBC: 174 ug/dL (ref 118–369)

## 2019-03-12 LAB — HEPATITIS C ANTIBODY: Hep C Virus Ab: <0.1 s/co ratio (ref 0.0–0.9)

## 2019-03-12 LAB — MICROALBUMIN / CREATININE URINE RATIO
Creatinine, Urine: 77.6 mg/dL
Microalb/Creat Ratio: 17 mg/g creat (ref 0–29)
Microalbumin, Urine: 13.3 ug/mL

## 2019-03-12 LAB — HEMOGLOBIN A1C
Est. average glucose Bld gHb Est-mCnc: 128 mg/dL
Hgb A1c MFr Bld: 6.1 % — ABNORMAL HIGH (ref 4.8–5.6)

## 2019-03-12 LAB — FOLATE: Folate: 13.8 ng/mL (ref >3.0)

## 2019-03-12 LAB — VITAMIN B12: Vitamin B-12: 680 pg/mL (ref 232–1245)

## 2019-03-12 LAB — TSH: TSH: 2.42 u[IU]/mL (ref 0.450–4.500)

## 2019-03-12 LAB — HEPATITIS B SURFACE ANTIGEN: Hepatitis B Surface Ag: NEGATIVE

## 2019-03-12 LAB — H. PYLORI BREATH TEST: H pylori Breath Test: NEGATIVE

## 2019-03-12 NOTE — Addendum Note (Signed)
Addended by: Karle Plumber B on: 03/12/2019 07:58 AM   Modules accepted: Orders

## 2019-03-15 ENCOUNTER — Ambulatory Visit (INDEPENDENT_AMBULATORY_CARE_PROVIDER_SITE_OTHER): Payer: Medicaid Other | Admitting: *Deleted

## 2019-03-15 DIAGNOSIS — I639 Cerebral infarction, unspecified: Secondary | ICD-10-CM | POA: Diagnosis not present

## 2019-03-15 LAB — CUP PACEART REMOTE DEVICE CHECK
Date Time Interrogation Session: 20200712194032
Implantable Pulse Generator Implant Date: 20191022

## 2019-03-16 ENCOUNTER — Telehealth: Payer: Self-pay

## 2019-03-16 NOTE — Telephone Encounter (Signed)
Contacted pt granddaughter and went over lab results and provided [place] appointment information  [place] is scheduled for March 24, 2019 @9am  @[facility]. Pt is to arrive @845am   Pt granddaughter is aware and doesn't have any questions or concerns

## 2019-03-23 LAB — SPECIMEN STATUS REPORT

## 2019-03-23 LAB — MITOCHONDRIAL ANTIBODIES: Mitochondrial Ab: <20 Units (ref 0.0–20.0)

## 2019-03-24 ENCOUNTER — Telehealth: Payer: Self-pay | Admitting: Internal Medicine

## 2019-03-24 ENCOUNTER — Ambulatory Visit (HOSPITAL_COMMUNITY)
Admission: RE | Admit: 2019-03-24 | Discharge: 2019-03-24 | Disposition: A | Discharge status: Home / Self Care | Payer: Medicaid Other | Source: Ambulatory Visit | Attending: Internal Medicine | Admitting: Internal Medicine

## 2019-03-24 DIAGNOSIS — R160 Hepatomegaly, not elsewhere classified: Secondary | ICD-10-CM | POA: Diagnosis not present

## 2019-03-24 DIAGNOSIS — K5900 Constipation, unspecified: Secondary | ICD-10-CM

## 2019-03-24 DIAGNOSIS — R140 Abdominal distension (gaseous): Secondary | ICD-10-CM | POA: Insufficient documentation

## 2019-03-24 DIAGNOSIS — R945 Abnormal results of liver function studies: Secondary | ICD-10-CM | POA: Diagnosis not present

## 2019-03-24 DIAGNOSIS — R918 Other nonspecific abnormal finding of lung field: Secondary | ICD-10-CM | POA: Diagnosis not present

## 2019-03-24 DIAGNOSIS — R7989 Other specified abnormal findings of blood chemistry: Secondary | ICD-10-CM

## 2019-03-24 NOTE — Telephone Encounter (Signed)
Will forward to covering provider.

## 2019-03-24 NOTE — Telephone Encounter (Signed)
Mikayla Reynolds from [facility] radiology is calling to make sure that provider is aware of the ultrasound that was done, they are also suggesting a scan of the abdominal area

## 2019-03-25 ENCOUNTER — Other Ambulatory Visit: Payer: Self-pay | Admitting: Family Medicine

## 2019-03-25 DIAGNOSIS — N2889 Other specified disorders of kidney and ureter: Secondary | ICD-10-CM

## 2019-03-25 NOTE — Telephone Encounter (Signed)
CT is schedule for Thursday April 01, 2019 at 10am at [facility]. Patient will need to arrive at 945am. NPO after 12am.   Contacted pt granddaughter to go over [place] and xray results also to give appointment information for CT.    Pt granddaughter doesn't have any questions or concerns   Will do prior auth for CT

## 2019-03-25 NOTE — Telephone Encounter (Signed)
I have ordered a CT abdomen and pelvis  and sent you a separate message regarding this

## 2019-03-26 NOTE — Progress Notes (Signed)
Carelink Summary Report / Loop Recorder 

## 2019-03-31 NOTE — Telephone Encounter (Signed)
CMA spoke to patient's granddaughter and inform on imaging time, date, and instruction.

## 2019-04-01 ENCOUNTER — Other Ambulatory Visit: Payer: Self-pay | Admitting: Family Medicine

## 2019-04-01 ENCOUNTER — Ambulatory Visit (HOSPITAL_COMMUNITY)
Admission: RE | Admit: 2019-04-01 | Discharge: 2019-04-01 | Disposition: A | Discharge status: Home / Self Care | Payer: Medicaid Other | Source: Ambulatory Visit | Attending: Family Medicine | Admitting: Family Medicine

## 2019-04-01 ENCOUNTER — Other Ambulatory Visit: Payer: Self-pay

## 2019-04-01 DIAGNOSIS — N2889 Other specified disorders of kidney and ureter: Secondary | ICD-10-CM

## 2019-04-01 MED ORDER — IOHEXOL 300 MG/ML  SOLN
100.0000 mL | Freq: Once | INTRAMUSCULAR | Status: AC | PRN
  Administered 2019-04-01: 11:00:00 100 mL via INTRAVENOUS

## 2019-04-02 ENCOUNTER — Ambulatory Visit: Payer: Medicaid Other | Attending: Internal Medicine

## 2019-04-02 ENCOUNTER — Telehealth: Payer: Self-pay | Admitting: Internal Medicine

## 2019-04-02 DIAGNOSIS — N2889 Other specified disorders of kidney and ureter: Secondary | ICD-10-CM

## 2019-04-02 DIAGNOSIS — I517 Cardiomegaly: Secondary | ICD-10-CM

## 2019-04-02 NOTE — Telephone Encounter (Signed)
Patient's grand daughter is aware of the ECHO being scheduled for 04/06/2019 at 1:15pm and to report to the Heart and Vascular entrance.

## 2019-04-02 NOTE — Telephone Encounter (Signed)
Phone call placed to patient's granddaughter today.  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  Concern for renal cancer.  Will refer urgently to urology. Other findings revealed moderate cardiomegaly and findings in the lung bases to suggest pulmonary edema.  Mild ascites and periportal edema.  There is some enhancement of the liver to suggest congestion/edema.  I will refer to cardiology.  Obtain an echocardiogram.  Check BNP and chemistry.  She will bring the patient to the laboratory today to have this test done.  Patient has follow-up appointment with me next week.

## 2019-04-03 LAB — BRAIN NATRIURETIC PEPTIDE: BNP: 591.2 pg/mL — ABNORMAL HIGH (ref 0.0–100.0)

## 2019-04-03 LAB — BASIC METABOLIC PANEL
BUN/Creatinine Ratio: 14 (ref 12–28)
BUN: 17 mg/dL (ref 8–27)
CO2: 21 mmol/L (ref 20–29)
Calcium: 8.9 mg/dL (ref 8.7–10.3)
Chloride: 102 mmol/L (ref 96–106)
Creatinine, Ser: 1.19 mg/dL — ABNORMAL HIGH (ref 0.57–1.00)
GFR calc Af Amer: 51 mL/min/{1.73_m2} — ABNORMAL LOW (ref >59)
GFR calc non Af Amer: 44 mL/min/{1.73_m2} — ABNORMAL LOW (ref >59)
Glucose: 88 mg/dL (ref 65–99)
Potassium: 4.4 mmol/L (ref 3.5–5.2)
Sodium: 140 mmol/L (ref 134–144)

## 2019-04-04 ENCOUNTER — Telehealth: Payer: Self-pay | Admitting: Internal Medicine

## 2019-04-04 MED ORDER — FUROSEMIDE 20 MG PO TABS: ORAL_TABLET | ORAL | 1 refills | Status: DC

## 2019-04-04 NOTE — Telephone Encounter (Signed)
PC placed to pt's grand-daughter.  I left message on VM informing her that recent blood tests show that kidney function still not 100% but unchanged compared to when checked in early July.  BNP was elevated.  I would like to start her on low dose fluid pill called Furosemide.  Rxn sent to [facility] pharmacy.

## 2019-04-05 MED FILL — FUROSEMIDE 20 MG TABS: 20 | 30 days supply | Qty: 15 | Fill #0

## 2019-04-06 ENCOUNTER — Ambulatory Visit (HOSPITAL_COMMUNITY)
Admission: RE | Admit: 2019-04-06 | Discharge: 2019-04-06 | Disposition: A | Discharge status: Home / Self Care | Payer: Medicaid Other | Source: Ambulatory Visit | Attending: Internal Medicine | Admitting: Internal Medicine

## 2019-04-06 ENCOUNTER — Other Ambulatory Visit: Payer: Self-pay

## 2019-04-06 DIAGNOSIS — I2510 Atherosclerotic heart disease of native coronary artery without angina pectoris: Secondary | ICD-10-CM | POA: Insufficient documentation

## 2019-04-06 DIAGNOSIS — I517 Cardiomegaly: Secondary | ICD-10-CM | POA: Diagnosis not present

## 2019-04-06 DIAGNOSIS — N189 Chronic kidney disease, unspecified: Secondary | ICD-10-CM | POA: Diagnosis not present

## 2019-04-06 DIAGNOSIS — Z8673 Personal history of transient ischemic attack (TIA), and cerebral infarction without residual deficits: Secondary | ICD-10-CM | POA: Diagnosis not present

## 2019-04-06 DIAGNOSIS — I083 Combined rheumatic disorders of mitral, aortic and tricuspid valves: Secondary | ICD-10-CM | POA: Insufficient documentation

## 2019-04-06 NOTE — Progress Notes (Signed)
  Echocardiogram 2D Echocardiogram has been performed.  Mikayla Reynolds G Mikayla Reynolds 04/06/2019, 2:14 PM

## 2019-04-07 ENCOUNTER — Telehealth: Payer: Self-pay | Admitting: Internal Medicine

## 2019-04-07 NOTE — Telephone Encounter (Signed)
PC placed to pt's grand-daughter Lance Bosch today.  I informed her that I received the results of the echo that patient had done yesterday.  This revealed that patient's heart function is very weak with an EF of 20 to 25%.  This is significantly declined compared to echo done 12/2017.  I inquired whether she picked up the prescription for the furosemide and started giving it to the patient as yet.  She indicated that she did.  They have an appointment with me in 2 days.  I recommend that they bring all the medicines that she has been taking from [place] to that appointment. She will need to see the cardiologist and I have submitted that referral already and requested an urgent appt with them.

## 2019-04-09 ENCOUNTER — Other Ambulatory Visit: Payer: Self-pay

## 2019-04-09 ENCOUNTER — Ambulatory Visit: Payer: Medicaid Other | Attending: Internal Medicine | Admitting: Internal Medicine

## 2019-04-09 ENCOUNTER — Encounter: Payer: Self-pay | Admitting: Internal Medicine

## 2019-04-09 VITALS — BP 131/76 | HR 56 | Temp 98.5°F | Resp 16 | Wt 122.6 lb

## 2019-04-09 DIAGNOSIS — E1122 Type 2 diabetes mellitus with diabetic chronic kidney disease: Secondary | ICD-10-CM | POA: Insufficient documentation

## 2019-04-09 DIAGNOSIS — Z7982 Long term (current) use of aspirin: Secondary | ICD-10-CM | POA: Insufficient documentation

## 2019-04-09 DIAGNOSIS — N183 Chronic kidney disease, stage 3 (moderate): Secondary | ICD-10-CM | POA: Insufficient documentation

## 2019-04-09 DIAGNOSIS — I129 Hypertensive chronic kidney disease with stage 1 through stage 4 chronic kidney disease, or unspecified chronic kidney disease: Secondary | ICD-10-CM | POA: Insufficient documentation

## 2019-04-09 DIAGNOSIS — I429 Cardiomyopathy, unspecified: Secondary | ICD-10-CM | POA: Insufficient documentation

## 2019-04-09 DIAGNOSIS — R600 Localized edema: Secondary | ICD-10-CM | POA: Insufficient documentation

## 2019-04-09 DIAGNOSIS — Z79899 Other long term (current) drug therapy: Secondary | ICD-10-CM | POA: Insufficient documentation

## 2019-04-09 DIAGNOSIS — N2889 Other specified disorders of kidney and ureter: Secondary | ICD-10-CM | POA: Diagnosis not present

## 2019-04-09 DIAGNOSIS — K5900 Constipation, unspecified: Secondary | ICD-10-CM | POA: Insufficient documentation

## 2019-04-09 MED ORDER — LISINOPRIL 2.5 MG PO TABS: 2.5000 mg | ORAL_TABLET | Freq: Every day | ORAL | 1 refills | Status: DC

## 2019-04-09 MED FILL — LISINOPRIL 2.5 MG TABLET: 2.5 | 30 days supply | Qty: 30 | Fill #0

## 2019-04-09 MED FILL — ATORVASTATIN CALCIUM 40 MG: 40 | 30 days supply | Qty: 30 | Fill #1

## 2019-04-09 NOTE — Patient Instructions (Signed)
Start the medication called lisinopril 2.5 mg daily.  After you have been on the medication for 1 week, I would like for you to return to the lab for [place] to recheck your kidney function with a blood draw.

## 2019-04-09 NOTE — Progress Notes (Signed)
Patient ID: Mikayla Reynolds, female    DOB: 19-Oct-1940  MRN: [id number]  CC: Follow-up   Subjective: Mikayla Reynolds is a 78 y.o. female who presents for f/u 1 mth f/u Her concerns today include:  HTN, CVART MCA loop recorder in place, pre-DM, CKD stage 3.  [name] is with her and interprets.  RT kidney mass: Since last visit she did have a CAT scan of the abdomen that revealed a large cystic mass on the right kidney concerning for CA.  She has been referred to urology.  She has an appointment next week.  She denies any hematuria. She has lost about 17 pounds since January of this year  Cardiomyopathy: Echo was ordered after cardiomegaly was noted incidentally on CAT scan.  BNP was around 500.  Echo revealed severely reduced systolic function with EF of 20 to 25% with normal cavity size but moderate asymmetric left ventricular hypertrophy and left ventricular diastolic parameters consistent with pseudonormalization.  The right ventricle had moderately reduced systolic function again with normal cavity size Patient denies chest pains.  She states that she was having some shortness of breath especially when laying down during the time when she was experiencing constipation.  However constipation is better since I put her on Senokot.  She reports that the shortness of breath has resolved.  She did have some lower extremity edema but that has resolved also.  I placed her on a very low dose of furosemide to take every other day.  She has an appointment with cardiology in September -She tells me that she was on a medication from a doctor in [place].  She states she was put on it for her heart.  She stopped taking the medicine several weeks ago after I last saw her.  Patient tells me that her appetite is okay but she does not eat a lot.  Gets full rather quick. Patient Active Problem List   Diagnosis Date Noted  . History of loop recorder 03/11/2019  . Macrocytic anemia 03/11/2019  . CKD (chronic  kidney disease), stage III (Mikayla Reynolds) 02/19/2018  . Stroke ([place]) 12/27/2017  . Elevated troponin 12/27/2017  . Chest pain 12/27/2017  . Essential hypertension      Current Outpatient Medications on File Prior to Visit  Medication Sig Dispense Refill  . aspirin EC 81 MG tablet Take 1 tablet (81 mg total) by mouth daily. 100 tablet 1  . atorvastatin (LIPITOR) 40 MG tablet Take 1 tablet (40 mg total) by mouth daily at 6 PM. 30 tablet 11  . furosemide (LASIX) 20 MG tablet 1 tab PO every other day 30 tablet 1  . PRESCRIPTION MEDICATION Take 20 mg by mouth See admin instructions. "Vastarel" 20 mg -trimetazidine dihydrochloride - from [place] pharmacy/ take one tablet (20 mg) twice daily    . PRESCRIPTION MEDICATION Take 4 mg by mouth See admin instructions. Candesartan 8 mg - from [place] pharmacy - take 1/2 tablet (4 mg) by mouth every morning    . PRESCRIPTION MEDICATION Take 0.25 mg by mouth See admin instructions. Alprazolam 0.5 mg - from [place] pharmacy/ take 1/2 tablet (0.25 mg) by mouth daily at bedtime    . senna-docusate (SENOKOT-S) 8.6-50 MG tablet Take 1 tablet by mouth daily. For constipation 30 tablet 2   No current facility-administered medications on file prior to visit.     No Known Allergies  Social History   Socioeconomic History  . Marital status: Widowed    Spouse name: Not on  file  . Number of children: Not on file  . Years of education: Not on file  . Highest education level: Not on file  Occupational History  . Not on file  Social Needs  . Financial resource strain: Not on file  . Food insecurity    Worry: Not on file    Inability: Not on file  . Transportation needs    Medical: Not on file    Non-medical: Not on file  Tobacco Use  . Smoking status: Never Smoker  . Smokeless tobacco: Never Used  Substance and Sexual Activity  . Alcohol use: Not Currently  . Drug use: Not Currently  . Sexual activity: Not Currently  Lifestyle  . Physical activity    Days  per week: Not on file    Minutes per session: Not on file  . Stress: Not on file  Relationships  . Social Herbalist on phone: Not on file    Gets together: Not on file    Attends religious service: Not on file    Active member of club or organization: Not on file    Attends meetings of clubs or organizations: Not on file    Relationship status: Not on file  . Intimate partner violence    Fear of current or ex partner: Not on file    Emotionally abused: Not on file    Physically abused: Not on file    Forced sexual activity: Not on file  Other Topics Concern  . Not on file  Social History Narrative  . Not on file    No family history on file.  Past Surgical History:  Procedure Laterality Date  . APPENDECTOMY    . LOOP RECORDER INSERTION N/A 06/23/2018   Procedure: LOOP RECORDER INSERTION;  Surgeon: [name], MD;  Location: [facility];  Service: Cardiovascular;  Laterality: N/A;  . TEE WITHOUT CARDIOVERSION N/A 06/23/2018   Procedure: TRANSESOPHAGEAL ECHOCARDIOGRAM (TEE);  Surgeon: [name], MD;  Location: [facility];  Service: Cardiovascular;  Laterality: N/A;    ROS: Review of Systems Negative except as stated above  PHYSICAL EXAM: BP 131/76   Pulse (!) 56   Temp 98.5 F (36.9 C) (Oral)   Resp 16   Wt 122 lb 9.6 oz (55.6 kg)   SpO2 95%   BMI 21.72 kg/m   Wt Readings from Last 3 Encounters:  04/09/19 122 lb 9.6 oz (55.6 kg)  03/11/19 126 lb (57.2 kg)  09/17/18 139 lb (63 kg)    Physical Exam  General appearance -pleasant elderly female in NAD.   Mental status - normal mood, behavior, speech, dress, motor activity, and thought processes Mouth -oral mucosa is moist  neck - supple, no significant adenopathy Chest - clear to auscultation, no wheezes, rales or rhonchi, symmetric air entry Heart -she is bradycardic.  No murmurs or gallops heard.   Abdomen -bowel sounds are normal.  Again I feel enlargement of a mass in the right  upper to middle part of the abdomen Extremities -no lower extremity edema  EKG: Sinus bradycardia ? Junctional rhythm with prolonged QT interval.  QT is 510.  No acute changes seen.  The EKG appears unchanged from one done back in April 2019  CMP Latest Ref Rng & Units 04/02/2019 03/11/2019 09/17/2018  Glucose 65 - 99 mg/dL 88 80 91  BUN 8 - 27 mg/dL 17 17 13   Creatinine 0.57 - 1.00 mg/dL 1.19(H) 1.22(H) 1.10(H)  Sodium 134 -  144 mmol/L 140 137 141  Potassium 3.5 - 5.2 mmol/L 4.4 4.8 3.7  Chloride 96 - 106 mmol/L 102 100 104  CO2 20 - 29 mmol/L 21 23 22   Calcium 8.7 - 10.3 mg/dL 8.9 9.4 8.9  Total Protein 6.0 - 8.5 g/dL - 8.7(H) 7.6  Total Bilirubin 0.0 - 1.2 mg/dL - 3.1(H) 1.6(H)  Alkaline Phos 39 - 117 IU/L - 253(H) 135(H)  AST 0 - 40 IU/L - 35 37  ALT 0 - 32 IU/L - 11 15   Lipid Panel     Component Value Date/Time   CHOL 170 12/28/2017 0529   TRIG 76 12/28/2017 0529   HDL 35 (L) 12/28/2017 0529   CHOLHDL 4.9 12/28/2017 0529   VLDL 15 12/28/2017 0529   LDLCALC 120 (H) 12/28/2017 0529    CBC    Component Value Date/Time   WBC 3.3 (L) 03/11/2019 1157   WBC 5.3 12/28/2017 0529   RBC 3.41 (L) 03/11/2019 1157   RBC 3.90 12/28/2017 0529   HGB 11.6 03/11/2019 1157   HCT 33.5 (L) 03/11/2019 1157   PLT 159 03/11/2019 1157   MCV 98 (H) 03/11/2019 1157   MCH 34.0 (H) 03/11/2019 1157   MCH 31.3 12/28/2017 0529   MCHC 34.6 03/11/2019 1157   MCHC 32.9 12/28/2017 0529   RDW 13.7 03/11/2019 1157   LYMPHSABS 0.7 03/11/2019 1157   MONOABS 0.5 12/27/2017 1252   EOSABS 0.1 03/11/2019 1157   BASOSABS 0.1 03/11/2019 1157    ASSESSMENT AND PLAN: 1. Right kidney mass Patient to keep appointment with urology next week  2. Cardiomyopathy, unspecified type ([place]) She will continue low-dose of furosemide every other day.  I have added low-dose of lisinopril.  After she has been on this medication for about a week she will return to the lab for a BMP.  I have sent a message to our referral  coordinator to try to get her cardiology appointment moved up much sooner than September - lisinopril (ZESTRIL) 2.5 MG tablet; Take 1 tablet (2.5 mg total) by mouth daily.  Dispense: 30 tablet; Refill: 1 - Basic Metabolic Panel; Future - EKG 12-Lead  3. Constipation, unspecified constipation type Continue the Senokot as needed    Patient was given the opportunity to ask questions.  Patient verbalized understanding of the plan and was able to repeat key elements of the plan.   Orders Placed This Encounter  Procedures  . Basic Metabolic Panel  . EKG 12-Lead     Requested Prescriptions   Signed Prescriptions Disp Refills  . lisinopril (ZESTRIL) 2.5 MG tablet 30 tablet 1    Sig: Take 1 tablet (2.5 mg total) by mouth daily.    Return in about 7 weeks (around 05/28/2019).  Karle Plumber, MD, FACP

## 2019-04-14 ENCOUNTER — Ambulatory Visit: Payer: Medicaid Other | Admitting: Internal Medicine

## 2019-04-14 ENCOUNTER — Other Ambulatory Visit: Payer: Self-pay

## 2019-04-14 ENCOUNTER — Encounter: Payer: Self-pay | Admitting: Internal Medicine

## 2019-04-14 VITALS — BP 106/72 | HR 60 | Ht 63.0 in | Wt 120.4 lb

## 2019-04-14 DIAGNOSIS — I639 Cerebral infarction, unspecified: Secondary | ICD-10-CM

## 2019-04-14 DIAGNOSIS — I5023 Acute on chronic systolic (congestive) heart failure: Secondary | ICD-10-CM

## 2019-04-14 LAB — CUP PACEART INCLINIC DEVICE CHECK
Date Time Interrogation Session: 20200812155529
Implantable Pulse Generator Implant Date: 20191022

## 2019-04-14 MED ORDER — FUROSEMIDE 20 MG PO TABS: 20.0000 mg | ORAL_TABLET | Freq: Every day | ORAL | 1 refills | Status: DC

## 2019-04-14 NOTE — Patient Instructions (Addendum)
Medication Instructions:  Your physician has recommended you make the following change in your medication:  1. INCREASE Lasix to 20 mg DAILY  *If you need a refill on your cardiac medications before your next appointment, please call your pharmacy*  Labwork: None ordered  Testing/Procedures: None ordered  Follow-Up: Remote monitoring is used to monitor your Pacemaker or ICD from home. This monitoring reduces the number of office visits required to check your device to one time per year. It allows [place] to keep an eye on the functioning of your device to ensure it is working properly. You are scheduled for a device check from home on 04/16/19. You may send your transmission at any time that day. If you have a wireless device, the transmission will be sent automatically. After your physician reviews your transmission, you will receive a postcard with your next transmission date.  Your physician recommends that you schedule a follow-up appointment on: 8/169/20 @ 11:30 am with [name], PA  Thank you for choosing [facility]!!   Trinidad Curet, RN [phone]  Any Other Special Instructions Will Be Listed Below (If Applicable).    Ch? ?? ?n u?ng i?t natri Low-Sodium Eating Plan Natri, m?t y?u t? c?u thnh c?a mu?i, gip qu v? duy tr cn b?ng c l?i cho s?c kh?e v? cc ch?t l?ng trong c? th?. Qu nhi?u natri c th? lm t?ng huy?t p v khi?n d?ch v ch?t th?i b? gi? l?i trong c? th? qu v?. Chuyn gia ch?m [place] s?c kh?e ho?c chuyn gia dinh d??ng c?a qu v? c th? khuy?n ngh? vi?c tun th? theo ch??ng trnh ny n?u qu v? b? cao huy?t p (t?ng huy?t p), b?nh th?n, b?nh gan, ho?c suy tim. ?n i?t natri h?n c th? gip gi?m huy?t p, gi?m s?ng n? v b?o v? tim, gan va? th?n cu?a quy? vi?. C nh?ng l?i khuyn no ?? lm theo ch??ng trnh ny? H??ng d?n chung  H?u h?t m?i ng??i theo ch??ng trnh ny c?n ph?i gi?i h?n l??ng dng natri ? m?c 1.500-2.000 mg (miligam) natri m?i ngy.  ??c nhn th?c ph?m   Cc nhn thng tin dinh d???ng li?t k l??ng natri trong m?t kh?u ph?n th?c ?n. N?u quy? vi? ?n nhi?u h?n m?t ph?n, quy? vi? ph?i nhn l??ng natri c trong danh sch v??i s? kh?u ph?n.  Ch?n th?c ph?m c l??ng natri d??i 140 mg trong m?i kh?u ph?n.  Trnh nh?ng th?c ph?m c t? 300 mg natri tr? ln trong m?i kh?u ph?n. Mua s?m  Tm cc s?n ph?m c hm l??ng natri th?p, th??ng ???c ghi trn nhn l "natri th?p" hay "khng thm mu?i."  Lun ki?m tra hm l??ng natri ngay c? khi th?c ph?m ???c ghi trn nhn l "khng c mu?i" ho?c "khng thm mu?i".  Mua th?c ph?m t??i. ? Trnh nh?ng th?c ph?m ?ng h?p v cc mn ?n ch? bi?n s?n ho?c ?ng l?nh. ? Trnh cc lo?i th?t ?ng h?p, ? x? l, ho?c ch? bi?n s?n  Mua bnh m c d??i 80 mg natri trong m?i lt. N?u n??ng  ?n nhi?u th?c ?n n?u t?i nh h?n v i?t th??c ?n ?? nh hng, th??c ?n t? ch?n v th?c ?n nhanh h?n.  Trnh thm mu?i khi n?u ?n. S? d?ng gia v? khng co? mu?i ho?c th?o d??c thay v mu?i ?n ho?c mu?i bi?n. Ki?m tra v?i chuyn gia ch?m [place] s?c kh?e ho??c d???c sy? c?a quy? vi? tr??c khi s? d?ng cc  s?n ph?m thay th? mu?i.  N?u b?ng d?u c ngu?n g?c th?c v?t, ch?ng h?n nh? d?u h?t c?i d?u, d?u h??ng d??ng, ho?c d?u  liu. Ln k? ho?ch cho b?a ?n  Khi ?n ? nh hng, hy yu c?u th??c ?n c?a qu v? ???c n?u nha?t ho?c khng c mu?i, n?u c th?Hessie Diener cc lo?i th?c ph?m ch?a MSG (monosodium glutamate). MSG ?i khi ???c thm vo th??c ?n cu?a Trung Qu?c, n??c canh th?t v m?t s? lo?i th?c ph?m ?ng h?p. Nh?ng lo?i th?c ?n no ???c khuy?n ngh?? Nh?ng m?c ???c li?t k c th? khng ph?i danh sch ??y ??. Hy trao ??i v?i bc s? chuyn khoa dinh d??ng v? cc l?a ch?n ch? ?? dinh d??ng no ph h?p nh?t v?i qu v?. Ng? c?c Ng? c?c i?t natri, bao g?m y?n m?ch, ha?t la m ph?ng v g?o v la m ???c nghi?n nh?. Bnh co? i?t natri. C?m khng mu?i. M? ?ng khng mu?i. Bnh m i?t natri. Bnh m nguyn h?t v m? ?ng  nguyn h?t. Marlou Starks c? Rau ?ng la?nh ho??c rau t??i. Rau ?ng h?p "khng thm mu?i". H?n h?p c chua nho v x?t c chua "khng thm mu?i". N???c p c chua v n??c rau p i?t natri ho??c gi?m natri. Tri cy Tra?i cy t??i, ?ng l?nh, ho?c ?ng h?p. N??c p tri cy. Th?t v cc th?c ph?m c protein khc Thi?t, th?t gia c?m, h?i s?n v c t??i ho?c ?ng l?nh (khng thm mu?i). C ng? v c h?i ?o?ng h?p i?t natri. Cc lo?i h?t khng ??p mu?i. ??u H-Lan, ??u v ??u l?ng kh khng thm mu?i. ??u ?ng h?p khng ??p mu?i. Tr?ng. B? h?t khng mu?i. S?a S?a. S?a ??u nnh. Pho mt c l??ng natri th?p t? nhin, ch?ng h?n nh? pho mt ricotta, mozzarella t??i, hay pho mt Th?y S?, pho mt gi?m natri ho?c c l??ng Natri th?p. Pho mt kem. S??a chua. M? v d?u B? khng mu?i. B? th?c v?t khng mu?i v khng c ch?t bo chuy?n ha. D?u th?c v?t nh? d?u h?t c?i d?u ho?c d?u  liu. Gia v? v cc th?c ph?m khc Th?o d??c va? gia vi? t??i v s?y kh. Gia vi? khng co? mu?i. N??t x?t c chua v m t?t c t natri. N??c tr?n sa-lt khng c natri. N??c x?t mayonnaise nh? khng c natri. Cy ca?i ng??a t??i ho??c ???p la?nh. N??c chanh. Gi?m. Sp t? lm, t natri, ho?c b?t natri. B?ng ng va? ba?nh quy khng mu?i. Khoai ty chin t mu?i ho?c khng c mu?i. Nh?ng lo?i th?c ?n no khng ???c khuy?n ngh?? Nh?ng m?c ???c li?t k c th? khng ph?i danh sch ??y ??. Hy trao ??i v?i bc s? chuyn khoa dinh d??ng v? cc l?a ch?n ch? ?? dinh d??ng no ph h?p nh?t v?i qu v?. Ng? c?c Ng? c?c nng ?n li?n. Bnh m nh?i, bnh v [place] ca?c loa?i. Bnh m n??ng. C?m ho?c m ?ng h?n h?p co? gia vi?Marland Kitchen C?c mi?. Mi? ?ng va? pho ma?t ?ng h?p ho?c ?ng l?nh. Ba?nh m??n thng th???ng. B?t tr?n s??n. Rau c? D?a c?i b?p, rau ngm gi?m v gia v?.  liu. Khoai ty chin. Hnh ty t?m b?t chin gin. Rau ?o?ng h?p thng th??ng (khng ph?i lo?i t natri ho??c gia?m natri). N??c x?t c chua ?ng h?p va? h?n h?p c chua nho thng  th??ng (khng ph?i lo?i i?t natri ho??c gi?m natri). N???c p rau v c  chua thng th??ng (khng ph?i lo?i i?t natri ho??c gi?m natri). Rau ?ng l?nh tr?n n??c s?t. Th?t v cc th?c ph?m c protein khc Thi?t ho?c c ???p mu?i, ?ng h?p, hun khi, t?m gia v?, ho??c d?m chua. Th?t l?n mu?i xng khi, gi?m bng, xc xch, xc xch nng, th?t b mu?i, th?t b rn, th?t h?p ?ng gi, th?t l?n mu?i, th?t b kh, c chch ngm gi?m, c tr?ng, c ng? ?ng h?p thng th??ng, c mi, qu? h?ch ??p mu?i. S?a Pho mt ch? bi?n s?n v pho mt ph?t. S?a ?ng pho mt. Ph mt c s?i m?c xanh. Ph mt [place]. Ph mt s?i. Ph mt s?a g?n kem thng th??ng. S?a b?. S?a ?ng h?p. M? v d?u B? m?n. B? th?c v?t thng th??ng. B? s??a tru. M?? thi?t xng kho?i. Gia v? v cc th?c ph?m khc Mu?i hnh, mu?i t?i, mu?i nm, mu?i ?n v mu?i bi?n. N???c thi?t ?ng h?p v ?ng gi. N???c x?t [place]. N???c x?t tartar. N??c x?t th?t quay. N???c x?t Teriyaki. N??c t??ng, bao g?m loa?i gi?m natri. N??c x?t th?t n??ng. N??c m?m. D?u ho. N??c x?t cocktail. Cy c?i ng?a quy? vi? th?y trn k? ha?ng. N??c x?t c chua v m t?t thng th???ng. H??ng li?u th?t v ch?t la?m m?m thi?t. Tho?i b?t canh. N??t x?t nng v n??c x?t [place]. N??c x?t marinat ch? bi?n s?n ho?c ?ng gi. Gia v? taco ch? bi?n s?n ho?c ?ng gi. ?? gia v?. N???c tr?n sa la?t thng th???ng. Salsa. Khoai ty v bnh ng. Bim bim va? ba?nh ng. B?ng ng v bnh quy m??n. Sp ?o?ng h?p ho??c kh. Pizza. Mn khai v? ?ng la?nh v bnh che?n. Tm t?t  ?n i?t natri h?n c th? gip gi?m huy?t p, gi?m s?ng n? v b?o v? tim, gan va? th?n cu?a quy? vi?.  H?u h?t m?i ng??i theo ch??ng trnh ny c?n ph?i gi?i h?n l??ng dng natri ? m?c 1.500-2.000 mg (miligam) natri m?i ngy.  Th?c ph?m ?ng lon, h?p v ?ng l?nh giu natri. Th??c ?n ?? nh hng, th?c ?n nhanh v pizza c?ng r?t nhi?u natri. Qu v? c?ng dng natri b?ng cch thm mu?i vo th?c ph?m.  C? g?ng  n?u ?n t?i nh, ?n nhi?u tri cy v rau c? t??i h?n, ?n t ?? ?n nhanh, ?? ?n ?ng h?p, ? x? l, ho?c ch? bi?n s?n h?n. Thng tin ny khng nh?m m?c ?ch thay th? cho l?i khuyn m chuyn gia ch?m Worth s?c kh?e ni v?i qu v?. Hy b?o ??m qu v? ph?i th?o lu?n b?t k? v?n ?? g m qu v? c v?i chuyn gia ch?m  s?c kh?e c?a qu v?. Document Released: 12/11/2015 Document Revised: 12/26/2016 Document Reviewed: 12/26/2016 Elsevier Patient Education  2020 [employer].

## 2019-04-15 ENCOUNTER — Ambulatory Visit: Payer: Medicaid Other | Admitting: Cardiology

## 2019-04-15 ENCOUNTER — Ambulatory Visit: Payer: Medicaid Other | Admitting: Gastroenterology

## 2019-04-15 DIAGNOSIS — D49511 Neoplasm of unspecified behavior of right kidney: Secondary | ICD-10-CM | POA: Diagnosis not present

## 2019-04-15 DIAGNOSIS — N183 Chronic kidney disease, stage 3 (moderate): Secondary | ICD-10-CM | POA: Diagnosis not present

## 2019-04-16 ENCOUNTER — Ambulatory Visit (INDEPENDENT_AMBULATORY_CARE_PROVIDER_SITE_OTHER): Payer: Medicaid Other | Admitting: *Deleted

## 2019-04-16 DIAGNOSIS — I639 Cerebral infarction, unspecified: Secondary | ICD-10-CM

## 2019-04-16 LAB — CUP PACEART REMOTE DEVICE CHECK
Date Time Interrogation Session: 20200814173125
Implantable Pulse Generator Implant Date: 20191022

## 2019-04-19 NOTE — Progress Notes (Signed)
PCP: Ladell Pier, MD Primary Cardiologist: none Primary EP: Dr Leretha Dykes is a 78 y.o. female who presents today for urgent DOD cardiology followup.  She has had some issues with poor appetite and weight loss.  She has also developed progressive abdominal fullness and edema. Her primary care physician has referred her to cardiology for further assessment.  Echo reveals acutely depressed EF.  Lasix and ACEi have been initiated prior to the visit today.  Today, she denies symptoms of palpitations, chest pain, shortness of breath, dizziness, presyncope, or syncope.  In general she does not feel well.  Her daughter reports that she has been found to have a new renal mass for which cancer is suspected. She has follow-up with nephrology later this week. The patient is otherwise without complaint today.   Past Medical History:  Diagnosis Date  . Chest pain 12/27/2017  . CKD (chronic kidney disease), stage III ([place]) 02/19/2018  . Elevated troponin 12/27/2017  . Pre-diabetes   . Stroke ([place])   . TIA (transient ischemic attack)    Past Surgical History:  Procedure Laterality Date  . APPENDECTOMY    . LOOP RECORDER INSERTION N/A 06/23/2018   Procedure: LOOP RECORDER INSERTION;  Surgeon: [name], MD;  Location: [place] [facility];  Service: Cardiovascular;  Laterality: N/A;  . TEE WITHOUT CARDIOVERSION N/A 06/23/2018   Procedure: TRANSESOPHAGEAL ECHOCARDIOGRAM (TEE);  Surgeon: [name], MD;  Location[facility];  Service: Cardiovascular;  Laterality: N/A;    ROS- all systems are reviewed and negatives except as per HPI above  Current Outpatient Medications  Medication Sig Dispense Refill  . aspirin EC 81 MG tablet Take 1 tablet (81 mg total) by mouth daily. 100 tablet 1  . atorvastatin (LIPITOR) 40 MG tablet Take 1 tablet (40 mg total) by mouth daily at 6 PM. 30 tablet 11  . lisinopril (ZESTRIL) 2.5 MG tablet Take 1 tablet (2.5 mg total) by mouth daily. 30 tablet 1   . senna-docusate (SENOKOT-S) 8.6-50 MG tablet Take 1 tablet by mouth daily. For constipation 30 tablet 2  . furosemide (LASIX) 20 MG tablet Take 1 tablet (20 mg total) by mouth daily. 90 tablet 1   No current facility-administered medications for this visit.     Physical Exam: Vitals:   04/14/19 1533  BP: 106/72  Pulse: 60  SpO2: 98%  Weight: 120 lb 6.4 oz (54.6 kg)  Height: 5\' 3"  (1.6 m)    GEN- The patient is chronically appearing, alert and oriented x 3 today.   Head- normocephalic, atraumatic Eyes-  Sclera clear, conjunctiva pink Ears- hearing intact Oropharynx- clear Neck + JVD Lungs- bibasilar rales, normal work of breathing Heart- Regular rate and rhythm, no murmurs, rubs or gallops, PMI not laterally displaced GI- soft, NT, ND, + BS Extremities- no clubbing, cyanosis, +1 edema  Wt Readings from Last 3 Encounters:  04/14/19 120 lb 6.4 oz (54.6 kg)  04/09/19 122 lb 9.6 oz (55.6 kg)  03/11/19 126 lb (57.2 kg)    EKG tracing 04/09/2019 shows sinus bradycardia  Assessment and Plan:  1. Acute systolic dysfunction Newly diagnosed Etiology is unclear Aggressiveness of our workup is dependant upon her newly diagnosed renal mass and prognosis.  She sees urology later this week.  I will defer decision related to cath until after this visit. She has been started on lasix. She has also just started taking an ace inhibitor.  This will need to be further titrated upon return.  Her bradycardia limits our ability to consider beta blocker therapy.  I worry that she is quite volume overloaded today.  She will return for further medicine titration in 1-2 weeks by EP PA.  2. Prior stroke S/p prior ILR placement. Her ILR has not demonstrated afib I will follow remotely  She will need to establish with general cardiology for further evaluation and management of her new CHF.  Given poor diet,  Weight loss and renal mass, I worry that her prognosis is poor.  Very complicated  patient at risk for decompensation/ hospitalization.  A high level of decision making was required for this encounter.  [name] MD, [facility]

## 2019-04-20 ENCOUNTER — Other Ambulatory Visit: Payer: Self-pay

## 2019-04-20 ENCOUNTER — Ambulatory Visit: Payer: Medicaid Other | Attending: Internal Medicine

## 2019-04-20 ENCOUNTER — Encounter: Payer: Self-pay | Admitting: *Deleted

## 2019-04-20 DIAGNOSIS — I429 Cardiomyopathy, unspecified: Secondary | ICD-10-CM

## 2019-04-21 ENCOUNTER — Ambulatory Visit (INDEPENDENT_AMBULATORY_CARE_PROVIDER_SITE_OTHER): Payer: Medicaid Other | Admitting: Student

## 2019-04-21 ENCOUNTER — Telehealth: Payer: Self-pay | Admitting: Internal Medicine

## 2019-04-21 VITALS — BP 128/76 | HR 60 | Ht 64.0 in | Wt 118.0 lb

## 2019-04-21 DIAGNOSIS — N2889 Other specified disorders of kidney and ureter: Secondary | ICD-10-CM | POA: Diagnosis not present

## 2019-04-21 DIAGNOSIS — I639 Cerebral infarction, unspecified: Secondary | ICD-10-CM | POA: Diagnosis not present

## 2019-04-21 DIAGNOSIS — I10 Essential (primary) hypertension: Secondary | ICD-10-CM

## 2019-04-21 DIAGNOSIS — I429 Cardiomyopathy, unspecified: Secondary | ICD-10-CM

## 2019-04-21 DIAGNOSIS — N183 Chronic kidney disease, stage 3 unspecified: Secondary | ICD-10-CM

## 2019-04-21 DIAGNOSIS — Z9889 Other specified postprocedural states: Secondary | ICD-10-CM

## 2019-04-21 LAB — BASIC METABOLIC PANEL
BUN/Creatinine Ratio: 21 (ref 12–28)
BUN: 24 mg/dL (ref 8–27)
CO2: 23 mmol/L (ref 20–29)
Calcium: 9.1 mg/dL (ref 8.7–10.3)
Chloride: 99 mmol/L (ref 96–106)
Creatinine, Ser: 1.12 mg/dL — ABNORMAL HIGH (ref 0.57–1.00)
GFR calc Af Amer: 55 mL/min/{1.73_m2} — ABNORMAL LOW (ref >59)
GFR calc non Af Amer: 48 mL/min/{1.73_m2} — ABNORMAL LOW (ref >59)
Glucose: 138 mg/dL — ABNORMAL HIGH (ref 65–99)
Potassium: 4.4 mmol/L (ref 3.5–5.2)
Sodium: 137 mmol/L (ref 134–144)

## 2019-04-21 MED ORDER — FUROSEMIDE 20 MG PO TABS: 20.0000 mg | ORAL_TABLET | Freq: Every day | ORAL | 1 refills | Status: DC

## 2019-04-21 MED ORDER — LISINOPRIL 5 MG PO TABS: 5.0000 mg | ORAL_TABLET | Freq: Every day | ORAL | 1 refills | Status: DC

## 2019-04-21 MED ORDER — FUROSEMIDE 20 MG PO TABS: 20.0000 mg | ORAL_TABLET | Freq: Every day | ORAL | 3 refills | Status: DC

## 2019-04-21 MED FILL — FUROSEMIDE 20 MG TABS: 20 | 30 days supply | Qty: 30 | Fill #0

## 2019-04-21 NOTE — Telephone Encounter (Signed)
Phone call placed to patient's granddaughter Lance Bosch to go over results of chemistry done yesterday.  She was told that kidney function is still not 100% but stable on the low-dose of lisinopril that we started on last visit. She tells me that patient was seen by Dr. Rayann Heman the cardiologist since her last visit.  He changed to furosemide from every other day to daily.  Patient has a follow-up appointment today with cardiology. [facility].  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  However given her frail status and new cardiomyopathy they recommended holding off on surgery for now.  She states they were told that if she does have surgery there is a chance that she may end up on dialysis afterwards and they do not want that.  Plan is to repeat CAT scan in 6 months.

## 2019-04-21 NOTE — Progress Notes (Addendum)
PCP:  Ladell Pier, MD Primary Cardiologist: No primary care provider on file. Electrophysiologist: None   Interpreter present  Mikayla Reynolds is a 78 y.o. female who presents today for one week follow up due to acute systolic CHF. Seen last week by Dr. Rayann Heman and lasix increased. Invasive work up deferred to "renal mass" of unclear etiology and pending work up. No exertional chest pain.  She feels somewhat better today. Weight down 2 lbs. She feels the swelling in her legs is much better. She continues to complain of orthopnea.  She says the renal doctor told her they wanted to "protect her kidneys" re: the R kidney mass, and that she was a poor candidate for surgery at this time due to her overall frailty, systolic heart failure, and poor HD candidate if needed post op (if the kidney would need to be removed).  Via the interpreter, pt states she did not understand her heart was weak. Her granddaughter typically helps her manage her health, but she was unable to come today. She denies symptoms of palpitations, chest pain, shortness of breath,  PND, claudication, dizziness, presyncope, syncope, bleeding, or neurologic sequela. The patient is tolerating medications without difficulties.    Past Medical History:  Diagnosis Date  . Chest pain 12/27/2017  . CKD (chronic kidney disease), stage III ([place]) 02/19/2018  . Elevated troponin 12/27/2017  . Pre-diabetes   . Stroke ([place])   . TIA (transient ischemic attack)    Past Surgical History:  Procedure Laterality Date  . APPENDECTOMY    . LOOP RECORDER INSERTION N/A 06/23/2018   Procedure: LOOP RECORDER INSERTION;  Surgeon: [name], MD;  Location: [place] [facility];  Service: Cardiovascular;  Laterality: N/A;  . TEE WITHOUT CARDIOVERSION N/A 06/23/2018   Procedure: TRANSESOPHAGEAL ECHOCARDIOGRAM (TEE);  Surgeon: [name], MD;  Location: [facility] ENDOSCOPY;  Service: Cardiovascular;  Laterality: N/A;     Current Outpatient Medications   Medication Sig Dispense Refill  . aspirin EC 81 MG tablet Take 1 tablet (81 mg total) by mouth daily. 100 tablet 1  . atorvastatin (LIPITOR) 40 MG tablet Take 1 tablet (40 mg total) by mouth daily at 6 PM. 30 tablet 11  . furosemide (LASIX) 20 MG tablet Take 1 tablet (20 mg total) by mouth daily. 90 tablet 1  . lisinopril (ZESTRIL) 2.5 MG tablet Take 1 tablet (2.5 mg total) by mouth daily. 30 tablet 1  . senna-docusate (SENOKOT-S) 8.6-50 MG tablet Take 1 tablet by mouth daily. For constipation 30 tablet 2   No current facility-administered medications for this visit.     No Known Allergies  Social History   Socioeconomic History  . Marital status: Widowed    Spouse name: Not on file  . Number of children: Not on file  . Years of education: Not on file  . Highest education level: Not on file  Occupational History  . Not on file  Social Needs  . Financial resource strain: Not on file  . Food insecurity    Worry: Not on file    Inability: Not on file  . Transportation needs    Medical: Not on file    Non-medical: Not on file  Tobacco Use  . Smoking status: Never Smoker  . Smokeless tobacco: Never Used  Substance and Sexual Activity  . Alcohol use: Not Currently  . Drug use: Not Currently  . Sexual activity: Not Currently  Lifestyle  . Physical activity    Days per week: Not  on file    Minutes per session: Not on file  . Stress: Not on file  Relationships  . Social Herbalist on phone: Not on file    Gets together: Not on file    Attends religious service: Not on file    Active member of club or organization: Not on file    Attends meetings of clubs or organizations: Not on file    Relationship status: Not on file  . Intimate partner violence    Fear of current or ex partner: Not on file    Emotionally abused: Not on file    Physically abused: Not on file    Forced sexual activity: Not on file  Other Topics Concern  . Not on file  Social History  Narrative  . Not on file     Review of Systems: General: No chills, fever, night sweats or weight changes  Cardiovascular:  No chest pain, dyspnea on exertion, edema, orthopnea, palpitations, paroxysmal nocturnal dyspnea Dermatological: No rash, lesions or masses Respiratory: No cough, dyspnea Urologic: No hematuria, dysuria Abdominal: No nausea, vomiting, diarrhea, bright red blood per rectum, melena, or hematemesis Neurologic: No visual changes, weakness, changes in mental status All other systems reviewed and are otherwise negative except as noted above.  Physical Exam: Vitals:   04/21/19 1140  BP: 128/76  Pulse: 60  Weight: 118 lb (53.5 kg)  Height: 5\' 4"  (1.626 m)   Wt Readings from Last 3 Encounters:  04/21/19 118 lb (53.5 kg)  04/14/19 120 lb 6.4 oz (54.6 kg)  04/09/19 122 lb 9.6 oz (55.6 kg)    GEN- The patient is elderly and chronically ill appearing, alert and oriented x 3 today.   HEENT: normocephalic, atraumatic; sclera clear, conjunctiva pink; hearing intact; oropharynx clear; neck supple, no JVP Lymph- no cervical lymphadenopathy Lungs- Clear to ausculation bilaterally, normal work of breathing.  No wheezes, rales, rhonchi Heart- Regular rate and rhythm, no murmurs, rubs or gallops, PMI not laterally displaced GI- soft, non-tender, non-distended, bowel sounds present, no hepatosplenomegaly Extremities- no clubbing, cyanosis, or edema; DP/PT/radial pulses 2+ bilaterally MS- no significant deformity or atrophy Skin- warm and dry, no rash or lesion Psych- euthymic mood, full affect Neuro- strength and sensation are intact  EKG is not ordered today. Personal review of echo from 04/09/2019 shows junctional rhythm at 54 bpm with   Assessment and Plan: 1. Acute systolic biventricular CHF Echo 04/06/2019 LVEF 20-25% with moderately reduced RV function.  NYHA II-III symptoms Volume status difficult. She has no peripheral edema but significant hepatojugular reflux, ?  This in the setting of her unclear abdominal process.  Continue lasix at increased dose of 20 mg daily, with extra 20 mg as needed Increase lisinopril to 5 mg daily. Could eventually consider entresto but with > BP effect would like to discuss her renal process further with nephrology/urology (It is unclear which specialist she saw) Defer beta blocker for now with junctional bradycardia noted. She would be a poor pacemaker candidate with unclear prognosis.   2. Abdominal mass R cystic renal mass noted on CT 04/01/2019 which cannot exclude RCC. Will attempt to contact [facility] for further clarity if available.  Noted periportal edema and indistinct enhancement of the liver.   3. Prior stroke S/p prior ILR placement Most recent remote transmission without Afib. Will continue to follow remotely.   ADDENDUM 04/22/2019: I spoke personally with granddaughter, Mikayla Reynolds. Pt saw Dr. Ellison Hughs at [facility]. She states that there  is a "50-60%" that her R kidney mass is cancer, and they have decided to "watch her" for 6 months as she would be a poor dialysis candidate in her current state of health.  I will attempt to contact Dr. Lovena Neighbours to discuss further for clarification.   ADDENDUM 04/26/2019: I spoke personally with Dr. Lovena Neighbours at [facility]. He states it is a "Bosniak category III cyst" and thererfor has a likelihood of 50-60% of being malignant. He plans to repeat imaging in 6 months and repeat surgery. He agrees with medical management of her CHF and does not have any specific concerns of [place] using ACE/ARB or spironolactone.  Prognosis will be more clear based on repeat imaging.   Shirley Friar, PA-C  04/21/19 11:44 AM

## 2019-04-21 NOTE — Patient Instructions (Addendum)
Medication Instructions:   Start taking Lisinopril 5 mg once a day   Take an extra Lasix 20 mg as needed for swelling   If you need a refill on your cardiac medications before your next appointment, please call your pharmacy.   Lab work: NONE ORDERED  TODAY   If you have labs (blood work) drawn today and your tests are completely normal, you will receive your results only by: Marland Kitchen MyChart Message (if you have MyChart) OR . A paper copy in the mail If you have any lab test that is abnormal or we need to change your treatment, we will call you to review the results.  Testing/Procedures: NONE ORDERED  TODAY  Follow-Up: AS   NEW PATIENT WITH ANY  GENERAL CARDIOLOGY  APP    Any Other Special Instructions Will Be Listed Below (If Applicable).

## 2019-04-26 NOTE — Progress Notes (Signed)
Carelink Summary Report / Loop Recorder 

## 2019-04-27 ENCOUNTER — Encounter

## 2019-04-27 ENCOUNTER — Ambulatory Visit: Payer: Medicaid Other | Admitting: Cardiovascular Disease

## 2019-05-07 ENCOUNTER — Ambulatory Visit: Payer: Medicaid Other | Admitting: Interventional Cardiology

## 2019-05-12 ENCOUNTER — Ambulatory Visit: Payer: Medicaid Other | Admitting: Cardiology

## 2019-05-19 ENCOUNTER — Ambulatory Visit (INDEPENDENT_AMBULATORY_CARE_PROVIDER_SITE_OTHER): Payer: Medicaid Other | Admitting: *Deleted

## 2019-05-19 DIAGNOSIS — I639 Cerebral infarction, unspecified: Secondary | ICD-10-CM | POA: Diagnosis not present

## 2019-05-20 ENCOUNTER — Encounter: Payer: Self-pay | Admitting: Gastroenterology

## 2019-05-20 ENCOUNTER — Ambulatory Visit: Payer: Medicaid Other | Admitting: Gastroenterology

## 2019-05-20 VITALS — BP 120/64 | HR 64 | Temp 97.4°F | Ht 63.0 in | Wt 120.2 lb

## 2019-05-20 DIAGNOSIS — R7989 Other specified abnormal findings of blood chemistry: Secondary | ICD-10-CM

## 2019-05-20 DIAGNOSIS — K5909 Other constipation: Secondary | ICD-10-CM

## 2019-05-20 DIAGNOSIS — R160 Hepatomegaly, not elsewhere classified: Secondary | ICD-10-CM | POA: Diagnosis not present

## 2019-05-20 DIAGNOSIS — R6881 Early satiety: Secondary | ICD-10-CM

## 2019-05-20 DIAGNOSIS — R140 Abdominal distension (gaseous): Secondary | ICD-10-CM

## 2019-05-20 DIAGNOSIS — R945 Abnormal results of liver function studies: Secondary | ICD-10-CM

## 2019-05-20 LAB — CUP PACEART REMOTE DEVICE CHECK
Date Time Interrogation Session: 20200916203724
Implantable Pulse Generator Implant Date: 20191022

## 2019-05-20 NOTE — Patient Instructions (Signed)
If you are age 77 or older, your body mass index should be between 23-30. Your Body mass index is 21.29 kg/m. If this is out of the aforementioned range listed, please consider follow up with your Primary Care Provider.  If you are age 14 or younger, your body mass index should be between 19-25. Your Body mass index is 21.29 kg/m. If this is out of the aformentioned range listed, please consider follow up with your Primary Care Provider.   Please take Miralax 1 capful twice today in 8oz of water, juice or milk.  Friday take Miralax 1 capful 3 times a day in 8oz of water, juice or milk.  Saturday take 1 capful of Miralax twice a day in 8oz of water, juice or milk.  Then starting Sunday take daily one capful of Miralax in 8oz of water, juice or milk.  Also on Sunday start a fiber supplement such as Citrucel daily.   It was a pleasure to see you today!  Dr. Loletha Carrow

## 2019-05-20 NOTE — Progress Notes (Signed)
[facility] Consult Note:  History: Mikayla Reynolds 05/20/2019  Referring provider: Ladell Pier, MD  Reason for consult/chief complaint: Bloated (several months of abdominal bloating/tightness/fullness and early satiety; possible decrease in weight although patient is unable to give quantification), abnormal lfts (per PCP, has abnormal LFT's and hepatomegaly), and Constipation (has had several months of constipation and "achy" feeling in LLQ; no blood in stool or rectal pain )   Subjective  HPI:  This is a pleasant 78 year old woman seen with the aid of a [place] interpreter for abdominal bloating satiety, chronic constipation and elevated LFTs with hepatomegaly found on imaging.  Even with interpreter it is somewhat difficult to get a clear description of her symptoms.  She appears to have abdominal bloating and is fairly generalized, she feels full easily and her appetite may have dropped off.  She does not know if she has lost any weight.  She seems most troubled by chronic constipation the last several months, and has now not had a bowel movement for several days and feels very uncomfortable.  She was on a stool softener with Senokot but no longer seems to be helping.  It sounds like she may have tried MiraLAX that was reportedly not very helpful, unknown dose frequency or duration of that therapy. There is apparently not been rectal bleeding, unknown if she is ever had any endoscopic procedures.  Admission April 2019 for acute ischemic right MCA stroke. Transthoracic echo last month shows severely reduced systolic function, EF 20 to 25%.  Report below.  LVEF was 50 to 55% in October 2019.  Seen by Dr. Rayann Heman of cardiology on 04/14/2019.  It was noted she had recently been diagnosed with the right renal mass and that work-up and treatment of the new onset CHF was dependent on the plan for the renal mass. More recent cardiology office note on 04/21/2019 got the  history that patient had seen nephrology and was deemed to be a poor surgical candidate.  He was found to have significant hepatojugular reflux on exam.  An addendum to that note indicates he was able to speak with Dr. Lovena Neighbours at [facility], dictating concerned that the mass may be malignant, they were apparently going to follow it radiographically as the patient was considered to be high risk surgical and poor dialysis candidate.   ROS:  Review of Systems  Constitutional: Positive for appetite change and fatigue. Negative for unexpected weight change.  HENT: Negative for mouth sores and voice change.   Eyes: Negative for pain and redness.  Respiratory: Positive for shortness of breath. Negative for cough.   Cardiovascular: Negative for chest pain and palpitations.  Genitourinary: Negative for dysuria and hematuria.  Musculoskeletal: Negative for arthralgias and myalgias.  Skin: Negative for pallor and rash.  Neurological: Negative for weakness and headaches.  Hematological: Negative for adenopathy.     Past Medical History: Past Medical History:  Diagnosis Date  . Chest pain 12/27/2017  . CKD (chronic kidney disease), stage III ([place]) 02/19/2018  . Elevated troponin 12/27/2017  . Pre-diabetes   . Stroke ([place])   . TIA (transient ischemic attack)    See additional details of history noted above  Past Surgical History: Past Surgical History:  Procedure Laterality Date  . abdominal wall surgery  2017  . APPENDECTOMY    . LOOP RECORDER INSERTION N/A 06/23/2018   Procedure: LOOP RECORDER INSERTION;  Surgeon: [name], MD;  Location: [place] [facility];  Service: Cardiovascular;  Laterality: N/A;  .  TEE WITHOUT CARDIOVERSION N/A 06/23/2018   Procedure: TRANSESOPHAGEAL ECHOCARDIOGRAM (TEE);  Surgeon: [name], MD;  Location: [facility] ENDOSCOPY;  Service: Cardiovascular;  Laterality: N/A;     Family History: History reviewed. No pertinent family history.  Social History:  Social History   Socioeconomic History  . Marital status: Widowed    Spouse name: Not on file  . Number of children: Not on file  . Years of education: Not on file  . Highest education level: Not on file  Occupational History  . Not on file  Social Needs  . Financial resource strain: Not on file  . Food insecurity    Worry: Not on file    Inability: Not on file  . Transportation needs    Medical: Not on file    Non-medical: Not on file  Tobacco Use  . Smoking status: Never Smoker  . Smokeless tobacco: Never Used  Substance and Sexual Activity  . Alcohol use: Not Currently  . Drug use: Not Currently  . Sexual activity: Not Currently  Lifestyle  . Physical activity    Days per week: Not on file    Minutes per session: Not on file  . Stress: Not on file  Relationships  . Social Herbalist on phone: Not on file    Gets together: Not on file    Attends religious service: Not on file    Active member of club or organization: Not on file    Attends meetings of clubs or organizations: Not on file    Relationship status: Not on file  Other Topics Concern  . Not on file  Social History Narrative  . Not on file    Allergies: No Known Allergies  Outpatient Meds: Current Outpatient Medications  Medication Sig Dispense Refill  . atorvastatin (LIPITOR) 40 MG tablet Take 1 tablet (40 mg total) by mouth daily at 6 PM. 30 tablet 11  . furosemide (LASIX) 20 MG tablet Take 1 tablet (20 mg total) by mouth daily. May take an extra tablet for swelling as needed 45 tablet 3  . lisinopril (ZESTRIL) 5 MG tablet Take 1 tablet (5 mg total) by mouth daily. 90 tablet 1  . senna-docusate (SENOKOT-S) 8.6-50 MG tablet Take 1 tablet by mouth daily. For constipation 30 tablet 2  . aspirin EC 81 MG tablet Take 1 tablet (81 mg total) by mouth daily. 100 tablet 1   No current facility-administered medications for this visit.        ___________________________________________________________________ Objective   Exam:  BP 120/64   Pulse 64   Temp (!) 97.4 F (36.3 C)   Ht 5\' 3"  (1.6 m)   Wt 120 lb 3.2 oz (54.5 kg)   BMI 21.29 kg/m   [place] interpreter Mikayla Reynolds present for entire encounter.    General: Frail elderly woman.  Gets on exam table without assistance.  Eyes: sclera anicteric, no redness  ENT: oral mucosa moist without lesions, poor dentition, no sublingual icterus, no cervical or supraclavicular lymphadenopathy  CV: RRR without murmur, S1/S2, no peripheral edema.  JVD to the angle of the jaw sitting up straight  Resp: Mild end inspiratory wheezing bilaterally, normal RR and effort noted  GI: soft, no tenderness, with active bowel sounds. No guarding or palpable organomegaly noted.  + Hepatojugular reflux  Skin; warm and dry, no rash or jaundice noted  Neuro: awake, alert and oriented x 3. Normal gross motor function and fluent speech  Labs:  CBC Latest Ref  Rng & Units 03/11/2019 09/17/2018 12/28/2017  WBC 3.4 - 10.8 x10E3/uL 3.3(L) 3.8 5.3  Hemoglobin 11.1 - 15.9 g/dL 11.6 11.5 12.2  Hematocrit 34.0 - 46.6 % 33.5(L) 35.2 37.1  Platelets 150 - 450 x10E3/uL 159 110(L) 159   CMP Latest Ref Rng & Units 04/20/2019 04/02/2019 03/11/2019  Glucose 65 - 99 mg/dL 138(H) 88 80  BUN 8 - 27 mg/dL 24 17 17   Creatinine 0.57 - 1.00 mg/dL 1.12(H) 1.19(H) 1.22(H)  Sodium 134 - 144 mmol/L 137 140 137  Potassium 3.5 - 5.2 mmol/L 4.4 4.4 4.8  Chloride 96 - 106 mmol/L 99 102 100  CO2 20 - 29 mmol/L 23 21 23   Calcium 8.7 - 10.3 mg/dL 9.1 8.9 9.4  Total Protein 6.0 - 8.5 g/dL - - 8.7(H)  Total Bilirubin 0.0 - 1.2 mg/dL - - 3.1(H)  Alkaline Phos 39 - 117 IU/L - - 253(H)  AST 0 - 40 IU/L - - 35  ALT 0 - 32 IU/L - - 11  Albumin 4.0 on 03/11/2019  In April 2019, transaminases and alkaline phosphatase normal, total bilirubin 2.0  In January 2020, AST and ALT normal, alkaline phosphatase 135, total bilirubin 1.6   Negative antimitochondrial antibody on 03/11/2019 Negative urea breath test Negative hepatitis C antibody and negative hepatitis B surface antigen  Recent iron level 83, 32% saturation, ferritin 684  Radiologic Studies:  Recent echocardiogram impressions:  1. The left ventricle has severely reduced systolic function, with an ejection fraction of 20-25%. The cavity size was normal. There is moderate asymmetric left ventricular hypertrophy. Left ventricular diastolic Doppler parameters are consistent with  pseudonormalization.  2. The right ventricle has moderately reduced systolic function. The cavity was normal. There is no increase in right ventricular wall thickness.  3. Left atrial size was moderately dilated.  4. The aortic valve is abnormal. Mild thickening of the aortic valve. No stenosis of the aortic valve. Moderate aortic annular calcification noted.  5. The mitral valve is grossly normal. There is mild to moderate mitral annular calcification present. Mitral valve regurgitation is mild to moderate by color flow Doppler.  6. The tricuspid valve is grossly normal. Tricuspid valve regurgitation is moderate.  7. The aorta is normal in size and structure.  8. The aortic root and ascending aorta are normal in size and structure.  ______________________________________________________   Right upper quadrant ultrasound 03/24/2019::  CLINICAL DATA:  Abnormal liver function test.   EXAM: ULTRASOUND ABDOMEN LIMITED RIGHT UPPER QUADRANT   COMPARISON:  None.   FINDINGS: Gallbladder:   No gallstones or wall thickening visualized. No sonographic Murphy sign noted by sonographer.   Common bile duct:   Diameter: 5 mm which is within normal limits.   Liver:   No focal lesion identified. Within normal limits in parenchymal echogenicity, although subtle nodularity of the hepatic contour is noted. Portal vein is patent on color Doppler imaging with normal direction of blood flow  towards the liver.   Large complex cystic abnormality measuring 13.5 by 8.3 x 5.2 cm is noted involving the right kidney.   IMPRESSION: 13.5 cm complex lesion seen in region of right kidney; CT scan of the abdomen and pelvis with intravenous contrast is recommended for further evaluation. These results will be called to the ordering clinician or representative by the Radiologist Assistant, and communication documented in the PACS or zVision Dashboard.   Subtle nodularity of the hepatic contour is noted suggesting possible hepatic cirrhosis. No focal sonographic abnormality is noted.  Electronically Signed   By: Marijo Conception M.D.   On: 03/24/2019 14:55 _______________________________________________  CT abdomen and pelvis 04/01/2019:  CLINICAL DATA:  Right renal mass on ultrasound   EXAM: CT ABDOMEN AND PELVIS WITHOUT AND WITH CONTRAST   TECHNIQUE: Multidetector CT imaging of the abdomen and pelvis was performed following the standard protocol before and following the bolus administration of intravenous contrast.   CONTRAST:  133mL OMNIPAQUE IOHEXOL 300 MG/ML  SOLN   COMPARISON:  03/24/2019   FINDINGS: Lower chest: Patchy mosaic attenuation at the lung bases. Moderate cardiomegaly. Trace right pleural effusion. Subcutaneous edema along the lower chest. Trace pericardial effusion.   Hepatobiliary: Periportal edema with mild geographic heterogeneity of enhancement in the liver. Pericholecystic fluid and possible gallbladder wall thickening. Upper abdominal ascites.   Pancreas: Pancreas divisum.   Spleen: Unremarkable   Adrenals/Urinary Tract: Both adrenal glands appear normal.   Complex cystic lesion of the right kidney arising from the lateral kidney and measuring 12.9 by 6.6 by 7.1 cm. This has some thick internal septation with punctate associated calcification and also thick margins which appear to enhance. No internal gas density. There is higher  density material along the inferolateral portion of the lesion.   Separate simple appearing 1.2 by 0.7 cm right kidney lower pole cyst anteriorly on image 74/6.   Stomach/Bowel: Unremarkable   Vascular/Lymphatic: Aortoiliac atherosclerotic vascular disease. No tumor thrombus in the renal veins. No pathologic adenopathy.   Reproductive: Unremarkable   Other: Diffuse subcutaneous and mesenteric edema.  Mild ascites.   Musculoskeletal: Lower lumbar degenerative facet arthropathy.   IMPRESSION: 1. 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 This has thickened internal septations, internal complexity, and a thick margin which appears to enhance. Cystic renal cell carcinoma is not excluded. No adenopathy, or tumor thrombus in the right renal vein. 2. Third spacing of fluid with mesenteric edema, subcutaneous edema, patchy mosaic attenuation in the lung bases likely from pulmonary edema, mild ascites, and periportal edema. 3. Indistinct heterogeneous enhancement in the liver compared though possibly from congestion/edema, correlation with serologic hepatitis panel is suggested to rule out hepatitis. 4.  Aortic Atherosclerosis (ICD10-I70.0). 5. Pancreas divisum. 6. Moderate cardiomegaly. Trace pericardial effusion and trace right pleural effusion.     Electronically Signed   By: Van Clines M.D.   On: 04/01/2019 13:38   Images personally reviewed, lumen of GI tract visualization somewhat limited by lack of oral contrast.  Assessment: Encounter Diagnoses  Name Primary?  . Hepatomegaly Yes  . LFTs abnormal   . Early satiety   . Abdominal bloating   . Other constipation     At least some of the bloating and early satiety is due to congestive hepatopathy caused by worsening CHF.  There is hepatomegaly with periportal edema all from that process, and this typically causes edema of the upper  digestive tract as well.  She has chronic constipation that I suspect may have developed from diuretics.  It also sounds like she does not get much dietary fiber because she has difficulty eating vegetables from poor dentition.  Her overall condition and prognosis seem poor.  Risk-benefit ratio does not favor pursuing endoscopic work-up.  Plan:  Stop stool softener/senna that has not lately been helpful. MiraLAX 1 capful in water (or milk, which she apparently likes to drink) 3 times a day for next 2 days, then decrease to twice daily for a  day, then once a day thereafter along with a tablespoon a day of Citrucel or Metamucil fiber in a glass of water.  See me or call for further advice as needed.  Thank you for the courtesy of this consult.  Please call me with any questions or concerns.   (Total time 60 minutes, extensive chart review as noted above and additional time needed for interpreter services and patient with multiple complex medical issues)  Nelida Meuse III  CC: Referring provider noted above

## 2019-05-24 ENCOUNTER — Other Ambulatory Visit: Payer: Self-pay

## 2019-05-24 ENCOUNTER — Encounter: Payer: Self-pay | Admitting: Cardiology

## 2019-05-24 ENCOUNTER — Ambulatory Visit (INDEPENDENT_AMBULATORY_CARE_PROVIDER_SITE_OTHER): Payer: Medicaid Other | Admitting: Cardiology

## 2019-05-24 VITALS — BP 120/64 | HR 67 | Ht 63.0 in | Wt 120.2 lb

## 2019-05-24 DIAGNOSIS — Z8673 Personal history of transient ischemic attack (TIA), and cerebral infarction without residual deficits: Secondary | ICD-10-CM

## 2019-05-24 DIAGNOSIS — I10 Essential (primary) hypertension: Secondary | ICD-10-CM | POA: Diagnosis not present

## 2019-05-24 DIAGNOSIS — I5041 Acute combined systolic (congestive) and diastolic (congestive) heart failure: Secondary | ICD-10-CM | POA: Diagnosis not present

## 2019-05-24 DIAGNOSIS — I639 Cerebral infarction, unspecified: Secondary | ICD-10-CM

## 2019-05-24 DIAGNOSIS — Z79899 Other long term (current) drug therapy: Secondary | ICD-10-CM

## 2019-05-24 DIAGNOSIS — N2889 Other specified disorders of kidney and ureter: Secondary | ICD-10-CM

## 2019-05-24 DIAGNOSIS — I429 Cardiomyopathy, unspecified: Secondary | ICD-10-CM | POA: Diagnosis not present

## 2019-05-24 MED ORDER — FUROSEMIDE 20 MG PO TABS: 20.0000 mg | ORAL_TABLET | Freq: Every day | ORAL | 3 refills | Status: DC

## 2019-05-24 MED ORDER — LISINOPRIL 5 MG PO TABS: 5.0000 mg | ORAL_TABLET | Freq: Every day | ORAL | 3 refills | Status: DC

## 2019-05-24 MED ORDER — ATORVASTATIN CALCIUM 40 MG PO TABS: 40.0000 mg | ORAL_TABLET | Freq: Every day | ORAL | 3 refills | Status: DC

## 2019-05-24 MED ORDER — SPIRONOLACTONE 25 MG PO TABS: 12.5000 mg | ORAL_TABLET | Freq: Every day | ORAL | 3 refills | Status: DC

## 2019-05-24 NOTE — Progress Notes (Signed)
Cardiology Office Note:    Date:  05/24/2019   ID:  Mikayla Reynolds, DOB 1940-09-22, MRN [id number]  PCP:  Ladell Pier, MD  Cardiologist:  Fransico Him, MD  Referring MD: Ladell Pier, MD   Chief Complaint  Patient presents with   Follow-up   Congestive Heart Failure    History of Present Illness:    Mikayla Reynolds is a 78 y.o. female with a past medical history significant for CKD stage III, stroke, prediabetes, and hyperlipidemia.  The patient had loop recorder inserted in 06/2018 as part of work-up for stroke.  The patient had prior EF 50-55% by TEE in 05/2018 during stroke work-up.  The patient had some issues with poor appetite and weight loss as well as progressive abdominal fullness and anemia.  Her PCP obtained an echocardiogram, 04/06/2019, which showed acutely decreased LV systolic function, EF 0000000, moderate asymmetric LV hypertrophy, diastolic dysfunction and mild to moderate mitral regurgitation.  The patient was started on ACE inhibitor and Lasix.  Invasive work-up was deferred due to renal mass of unclear etiology and pending work-up.  She was seen on 04/21/2019 by [name], PA.  It was noted that the nephrologist wanted to protect her kidneys.  She was felt to be a poor candidate for surgery due to overall frailty, systolic heart failure and poor HD candidate if needed postop (if the kidney would need to be removed).  She was overall feeling better on medications.  Per Andy's note: Pt saw Dr. 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  He plans to repeat imaging in 6 months and repeat surgery.  He agrees with medical management of her CHF and does not have any specific concerns of using ACE/ARB or Spironolactone.  Prognosis will be more clear based on repeat imaging.  The patient was seen by GI on 05/20/2019 for abdominal bloating, the patient, abnormal LFTs and hepatomegaly.   She was advised to take MiraLAX.  The patient is here today to establish with general cardiology, has been seen only by electrophysiology. The visit is conducted with the assistance of a [place] Interpreter though [employer], [place].  Her loop recorder has not recorded any atrial fibrillation.  The patient states that her breathing was really helped by lasix. Now normal. No orthopnea, PND or edema. No chest pain/pressure. No lightheadedness or syncope.   She says when lisinopril was 2.5 mg it made her feel short of breath and bad, now on 5 mg she feels much better.   She does not know any health history of her parents or siblings. She says her parents just died of old age.   She does not smoke or drink alcohol.   Past Medical History:  Diagnosis Date   Chest pain 12/27/2017   CKD (chronic kidney disease), stage III ([place]) 02/19/2018   Elevated troponin 12/27/2017   Pre-diabetes    Stroke ([place])    TIA (transient ischemic attack)     Past Surgical History:  Procedure Laterality Date   abdominal wall surgery  2017   APPENDECTOMY     LOOP RECORDER INSERTION N/A 06/23/2018   Procedure: LOOP RECORDER INSERTION;  Surgeon: [name], MD;  Location: [place] [facility];  Service: Cardiovascular;  Laterality: N/A;   TEE WITHOUT CARDIOVERSION N/A 06/23/2018   Procedure: TRANSESOPHAGEAL ECHOCARDIOGRAM (TEE);  Surgeon: [name], MD;  Location: [place];  Service: Cardiovascular;  Laterality:  N/A;    Current Medications: Current Meds  Medication Sig   aspirin EC 81 MG tablet Take 1 tablet (81 mg total) by mouth daily.   atorvastatin (LIPITOR) 40 MG tablet Take 1 tablet (40 mg total) by mouth daily at 6 PM.   furosemide (LASIX) 20 MG tablet Take 1 tablet (20 mg total) by mouth daily. May take an extra tablet for swelling as needed   lisinopril (ZESTRIL) 5 MG tablet Take 1 tablet (5 mg total) by mouth daily.   senna-docusate (SENOKOT-S) 8.6-50 MG tablet Take  1 tablet by mouth daily. For constipation   [DISCONTINUED] atorvastatin (LIPITOR) 40 MG tablet Take 1 tablet (40 mg total) by mouth daily at 6 PM.   [DISCONTINUED] furosemide (LASIX) 20 MG tablet Take 1 tablet (20 mg total) by mouth daily. May take an extra tablet for swelling as needed   [DISCONTINUED] lisinopril (ZESTRIL) 5 MG tablet Take 1 tablet (5 mg total) by mouth daily.     Allergies:   Patient has no known allergies.   Social History   Socioeconomic History   Marital status: Widowed    Spouse name: Not on file   Number of children: Not on file   Years of education: Not on file   Highest education level: Not on file  Occupational History   Not on file  Social Needs   Financial resource strain: Not on file   Food insecurity    Worry: Not on file    Inability: Not on file   Transportation needs    Medical: Not on file    Non-medical: Not on file  Tobacco Use   Smoking status: Never Smoker   Smokeless tobacco: Never Used  Substance and Sexual Activity   Alcohol use: Not Currently   Drug use: Not Currently   Sexual activity: Not Currently  Lifestyle   Physical activity    Days per week: Not on file    Minutes per session: Not on file   Stress: Not on file  Relationships   Social connections    Talks on phone: Not on file    Gets together: Not on file    Attends religious service: Not on file    Active member of club or organization: Not on file    Attends meetings of clubs or organizations: Not on file    Relationship status: Not on file  Other Topics Concern   Not on file  Social History Narrative   Not on file     Family History: The patient's family history includes Healthy in her brother and sister. ROS:   Please see the history of present illness.     All other systems reviewed and are negative.  EKGs/Labs/Other Studies Reviewed:    The following studies were reviewed today:  Echocardiogram 04/06/2019 IMPRESSIONS   1. The  left ventricle has severely reduced systolic function, with an ejection fraction of 20-25%. The cavity size was normal. There is moderate asymmetric left ventricular hypertrophy. Left ventricular diastolic Doppler parameters are consistent with  pseudonormalization.  2. The right ventricle has moderately reduced systolic function. The cavity was normal. There is no increase in right ventricular wall thickness.  3. Left atrial size was moderately dilated.  4. The aortic valve is abnormal. Mild thickening of the aortic valve. No stenosis of the aortic valve. Moderate aortic annular calcification noted.  5. The mitral valve is grossly normal. There is mild to moderate mitral annular calcification present. Mitral valve regurgitation is mild  to moderate by color flow Doppler.  6. The tricuspid valve is grossly normal. Tricuspid valve regurgitation is moderate.  7. The aorta is normal in size and structure.  8. The aortic root and ascending aorta are normal in size and structure.  9. Thickened.   EKG:  EKG is not ordered today.    Recent Labs: 03/11/2019: Hemoglobin 11.6; Platelets 159; TSH 2.420 04/02/2019: BNP 591.2 05/24/2019: ALT 21; BUN 12; Creatinine, Ser 1.18; Potassium 4.2; Sodium 134   Recent Lipid Panel    Component Value Date/Time   CHOL 147 05/24/2019 1628   TRIG 63 05/24/2019 1628   HDL 61 05/24/2019 1628   CHOLHDL 2.4 05/24/2019 1628   CHOLHDL 4.9 12/28/2017 0529   VLDL 15 12/28/2017 0529   LDLCALC 120 (H) 12/28/2017 0529    Physical Exam:    VS:  BP 120/64    Pulse 67    Ht 5\' 3"  (1.6 m)    Wt 120 lb 3.2 oz (54.5 kg)    SpO2 99%    BMI 21.29 kg/m     Wt Readings from Last 3 Encounters:  05/24/19 120 lb 3.2 oz (54.5 kg)  05/20/19 120 lb 3.2 oz (54.5 kg)  04/21/19 118 lb (53.5 kg)     Physical Exam  Constitutional: She is oriented to person, place, and time. She appears well-developed and well-nourished. No distress.  HENT:  Head: Normocephalic and atraumatic.  Neck:  Normal range of motion. Neck supple. No JVD present.  Cardiovascular: Normal rate, regular rhythm, normal heart sounds and intact distal pulses. Exam reveals no gallop and no friction rub.  No murmur heard. Pulmonary/Chest: Effort normal and breath sounds normal. No respiratory distress. She has no wheezes. She has no rales.  Abdominal: Soft. Bowel sounds are normal.  Musculoskeletal: Normal range of motion.        General: No edema.  Neurological: She is alert and oriented to person, place, and time.  Skin: Skin is warm and dry.  Psychiatric: She has a normal mood and affect. Her behavior is normal. Judgment and thought content normal.  Vitals reviewed.    ASSESSMENT:    1. Acute combined systolic and diastolic heart failure ([place])   2. History of stroke   3. Right kidney mass   4. Cardiomyopathy, unspecified type ([place])   5. Cerebrovascular accident (CVA), unspecified mechanism ([place])   6. Essential hypertension   7. Medication management    PLAN:    In order of problems listed above:  Acute combined systolic and diastolic heart failure -Newly diagnosed last month with EF 20-25%, moderate asymmetric LVH, diastolic dysfunction.  Prior EF 50-55% by TEE in 06/2018.  Moderately reduced RV function. -Patient was started on Lasix and ACE inhibitor.  Unable to start beta-blocker due to bradycardia. -Pt doing very well, breathing normal and appears euvolemic. Will continue to optimize medical therapy. Add low dose spironolactone 12.5 mg daily. Will check BMet today and in a week. Pt advised to let us know if she develops lightheadedness.  -Will plan on follow up in 3 weeks. Plan for echo in 3 months after medication optimization for LV function.  -We discussed daily wts and when to call office, limiting salt intake, reporting increased swelling or shortness of breath-explained through interpreter. Printed info provided in [place].   Prior stroke -Patient has implanted loop recorder  which has not demonstrated A. Fib. -Patient is on aspirin and a statin  Renal mass -Pt saw Dr. 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  He plans to repeat imaging in 6 months and repeat surgery.  He agrees with medical management of her CHF and does not have any specific concerns of using ACE/ARB or Spironolactone.  Prognosis will be more clear based on repeat imaging. -Renal function appears stable with serum creatinine 1.12 on 04/20/2019.  Hyperlipidemia -On atorvastatin 40 mg daily.  LDL was 120 in 12/2017.  -Will check lipid panel.   Medication Adjustments/Labs and Tests Ordered: Current medicines are reviewed at length with the patient today.  Concerns regarding medicines are outlined above. Labs and tests ordered and medication changes are outlined in the patient instructions below:  Patient Instructions  Medication Instructions:  START: Spironolactone 12.5 mg once a day   If you need a refill on your cardiac medications before your next appointment, please call your pharmacy.   Lab work: TODAY: LIPIDS & CMET  FUTURE: BMET in week  If you have labs (blood work) drawn today and your tests are completely normal, you will receive your results only by:  [place] (if you have MyChart) OR  A paper copy in the mail If you have any lab test that is abnormal or we need to change your treatment, we will call you to review the results.  Testing/Procedures: None   Follow-Up: At [facility], you and your health needs are our priority.  As part of our continuing mission to provide you with exceptional heart care, we have created designated Provider Care Teams.  These Care Teams include your primary Cardiologist (physician) and Advanced Practice Providers (APPs -  Physician Assistants and Nurse Practitioners) who all work together to provide you with the care you need, when you need  it. You will need a follow up appointment in 3 weeks. You may see Dr. Radford Pax or one of the following Advanced Practice Providers on your designated Care Team:   Lyda Jester, PA-C Dayna Dunn, PA-C  Ermalinda Barrios, PA-C  Any Other Special Instructions Will Be Listed Below (If Applicable).   Spironolactone tablets ?y l thu?c g? SPIRONOLACTONE l thu?c l?i ti?u. N gip qu v? ?i ti?u nhi?u h?n v lo?i b? l??ng n??c th?a ra kh?i c? th?. Thu?c ny ???c dng ?? ?i?u tr? huy?t p cao, v ch?ng ph ho?c s?ng do b?nh tim, th?n, ho?c gan. N c?ng ???c dng ?? ?i?u tr? cho nh?ng b?nh nhn c qu nhi?u aldosterone ho?c c m?c kali th?p. Thu?c ny c th? ???c dng cho nh?ng m?c ?ch khc; hy h?i ng??i cung c?p d?ch v? y t? ho?c d??c s? c?a mnh, n?u qu v? c th?c m?c. (CC) NHN HI?U PH? BI?N: Aldactone Ti c?n ph?i bo cho ng??i cung c?p d?ch v? y t? c?a mnh ?i?u g tr??c khi dng thu?c ny? H? c?n bi?t li?u qu v? hi?n c b?t k? tnh tr?ng no sau ?y hay khng:  m?c kali trong mu cao  b?nh th?n ho?c c v?n ?? khi ?i ti?u  b?nh gan  pha?n ??ng b?t th???ng ho??c di? ??ng v??i spironolactone  pha?n ??ng b?t th???ng ho??c di? ??ng v??i ca?c d??c ph?m kha?c  pha?n ??ng b?t th???ng ho??c di? ??ng v??i th??c ph?m, thu?c nhu?m, ho??c ch?t ba?o qua?n  ?ang c thai ho??c ??nh co? thai  ?ang cho con bu? Ti nn s? d?ng thu?c ny nh? th? no? U?ng thu?c ny v?i m?t ly n??c. Hy lm theo cc h??ng  d?n trn h?p thu?c ho?c nhn thu?c. Qu v? c th? u?ng thu?c ny cng ho?c khng cng v?i th?c ?n. N?u thu?c lm kh ch?u bao t? qu v?, th hy u?ng thu?c cng v?i th?c ?n. Khng ???c dng thu?c ny nhi?u l?n h?n ? ???c ch? d?n. Hy nh? r?ng qu v? s? c?n ?i ti?u nhi?u h?n sau khi dng thu?c ny. Khng nn dng thu?c ny vo th?i ?i?m no ? trong ngy m s? gy b?t ti?n cho qu v?. Khng nn dng vo gi? ?i ng?. Hy bn v?i bc s? nhi khoa c?a qu v? v? vi?c dng thu?c ny ? tr? em. Thu?c  ny c th? ???c k toa trong nh?ng tr??ng h?p ch?n l?c, nh?ng c?n ph?i th?n tr?ng. Qu li?u: N?u qu v? cho r?ng mnh ? dng qu nhi?u thu?c ny, th hy lin l?c v?i trung tm ki?m sot ch?t ??c ho?c phng c?p c?u ngay l?p t?c. L?U : Thu?c ny ch? dnh ring cho qu v?. Khng chia s? thu?c ny v?i nh?ng ng??i khc. N?u ti l? qun m?t li?u th sao? N?u qu v? l? qun m?t li?u thu?c, hy dng li?u thu?c ? ngay khi c th?. N?u h?u nh? ? ??n gi? dng li?u thu?c k? ti?p, th ch? dng li?u thu?c k? ti?p ?Marland Kitchen Khng ???c dng li?u g?p ?i ho?c dng thm li?u. Nh?ng g c th? t??ng tc v?i thu?c ny? Khng ???c dng thu?c ny cng v?i b?t k? th? no sau ?y:  cidofovir  eplerenone  tranylcypromine Thu?c ny c?ng c th? t??ng tc v?i cc thu?c sau ?y:  aspirin  m?t s? thu?c dng ?? tr? ch?ng huy?t p cao ho?c b?nh tim, nh? l benazepril, lisinopril, losartan, valsartan  m?t s? thu?c ?i?u tr? ho?c phng ng?a c?c mu ?ng, nh? l heparin v enoxaparin  cholestyramine  cyclosporine  digoxin  lithium  cc thu?c lm gin c? ?? gi?i ph?u  Cc thu?c khng vim khng ph?i steroid (Non steroidal anti-inflamation drug - NSAID), cc thu?c gi?m ?au v khng vim, ch?ng h?n nh? ibuprofen, naproxen  cc thu?c l?i ti?u khc  cc ch? ph?m b? sung kali  cc thu?c steroid, ch?ng h?n nh? prednisone ho?c cortisone  trimethoprim Danh sch ny c th? khng m t? ?? h?t cc t??ng tc c th? x?y ra. Hy ??a cho ng??i cung c?p d?ch v? y t? c?a mnh danh sch t?t c? cc thu?c, th?o d??c, cc thu?c khng c?n toa, ho?c cc ch? ph?m b? sung m qu v? dng. C?ng nn bo cho h? bi?t r?ng qu v? c ht thu?c, u?ng r??u, ho?c c s? d?ng ma ty tri php hay khng. Vi th? c th? t??ng tc v?i thu?c c?a qu v?. Ti c?n ph?i theo di ?i?u g trong khi dng thu?c ny? Hy ?i g?p bc s? ho?c [place] vin y t? ?? theo di ??nh k? s? c?i thi?n c?a qu v?. Hy ?o huy?t p nh? ? ???c ch? d?n. Hy h?i bc s? ho?c chuyn  vin y t? ?? xem huy?t p c?a mnh nn ? m?c no, v khi no th qu v? c?n ph?i lin l?c v?i h?. Qu v? c th? c?n ph?i theo m?t ch? ?? ?n ??c bi?t trong khi dng thu?c ny. Hy h?i bc s?Darlin Coco ra, hy h?i xem qu v? c?n ph?i u?ng bao nhiu ly n??c trong m?t ngy. Qu v? khng ???c ?? b? m?t n??c. Thu?c ny c th? lm cho qu v? c?m th?y  b? l?n th?n, chng m?t ho?c chong vng. U?ng r??u v dng m?t s? thu?c c th? lm cho tnh tr?ng ny n?ng h?n. Khng ???c li xe, s? d?ng my mc, ho?c lm nh?ng vi?c c?n ph?i t?nh to cho t?i khi qu v? bi?t ???c thu?c ny ?nh h??ng ln qu v? nh? th? no. Khng ???c ??ng d?y ho?c ng?i d?y m?t cch nhanh chng. Ti c th? nh?n th?y nh?ng tc d?ng ph? no khi dng thu?c ny? Nh?ng tc d?ng ph? qu v? c?n ph?i bo cho bc s? ho?c chuyn vin y t? cng s?m cng t?t:  cc ph?n ?ng d? ?ng, ch?ng h?n nh? da b? n?i ban ho?c ng?a, n?i my ?ay, s?ng ? m?t, mi, l??i, ho?c h?ng  phn c mu ?en, mu h?c n  tim ??p nhanh ho?c khng ??u  s?t  ?au c? b?p ho?c v?p b?  t ho?c c?m gic nh? b? ki?n b ? bn tay ho?c bn chn  kh th?  kh ?i ti?u  xu?t huy?t b?t th??ng  m?t m?i ho?c y?u ?t b?t th??ng Cc tc d?ng ph? khng c?n ph?i ch?m [place] y t? (hy bo cho bc s? ho?c chuyn vin y t?, n?u cc tc d?ng ph? ny ti?p di?n ho?c gy phi?n toi):  cc thay ??i v? gi?ng ni ho?c m?c lng  l l?n  bu?n ng?, chng m?t  kh mi?ng, kht n??c nhi?u h?n  v b? ph ??i ho?c  ?m  ?au ??u  kinh nguy?t khng ??u  s? kh kh?n v? tnh d?c, khng th? c??ng c?ng  kh ch?u ? bao t? Danh sch ny c th? khng m t? ?? h?t cc tc d?ng ph? c th? x?y ra. Xin g?i t?i bc s? c?a mnh ?? ???c c? v?n chuyn mn v? cc tc d?ng ph?Sander Nephew v? c th? t??ng trnh cc tc d?ng ph? cho FDA theo s? [phone]. Ti nn c?t gi? thu?c c?a mnh ? ?u? ?? ngoi t?m tay tr? em. C?t gi? ? nhi?t ?? d??i 25 ?? C (78 ?? F). V?t b? t?t c? thu?c ch?a dng sau ngy h?t h?n in trn nhn  thu?c ho?c bao thu?c. L?U : ?y l b?n tm t?t. N c th? khng bao hm t?t c? thng tin c th? c. N?u qu v? th?c m?c v? thu?c ny, xin trao ??i v?i bc s?, d??c s?, ho?c ng??i cung c?p d?ch v? y t? c?a mnh.  2020 Elsevier/Gold Standard (2017-02-17 00:00:00)  CAN USE MRS. DASH TO SEASON FOOD, AVOID EXCESS SALT INTAKE  Suy tim, T? ch?m [place] Heart Failure, Self Care Suy tim l m?t tnh tr?ng nghim tr?ng. Ti li?u ny gi?i thch nh?ng vi?c qu v? c?n lm ?? ch?m [place] b?n thn sau khi ???c ch?n ?on b? suy tim. Qu v? c th? ???c yu c?u thay ??i ch? ?? ?n, dng m?t s? lo?i thu?c nh?t ??nh v th?c hi?n nh?ng thay ??i l?i s?ng khc ?? cng kh?e m?nh cng t?t. Chuyn gia ch?m [place] s?c kh?e c?ng c th? c h??ng d?n c? th? h?n cho qu v?. Hy lin l?c v?i chuyn gia ch?m [place] s?c kh?e n?u qu v? c cc v?n ?? ho?c th?c m?c. C nh?ng nguy c? no? B? suy tim khi?n qu v? c nguy c? cao h?n g?p ph?i m?t s? v?n ?? nh?t ??nh. Nh?ng v?n ?? ny c th? tr?m tr?ng h?n n?u qu v? khng ch?m [place] t?t cho b?n thn. Cc v?n ?? c  th? bao g?m:  V?n ?? ?ng mu. V?n ?? ny c th? gy ??t qu?Marland Kitchen  T?n th??ng th?n, gan ho?c ph?i.  Nh?p tim b?t th??ng. Cc v?t d?ng c?n thi?t:  Cn ?? theo di cn n?ng.  Theo di huy?t p.  S? tay.  Thu?c. Cch ch?m [place] b?n thn khi qu v? b? suy tim Thu?c Ch? s? d?ng thu?c khng k ??n v thu?c k ??n theo ch? d?n c?a chuyn gia ch?m [place] s?c kh?e. Thu?c lm gi?m kh?i l??ng cng vi?c c?a tim, lm ch?m s? ti?n tri?n c?a suy tim v c?i thi?n cc tri?u ch?ng. Dng cc lo?i thu?c c?a qu v? m?i ngy.  Khng d?ng s? d?ng thu?c tr? khi c ch? d?n c?a chuyn gia ch?m [place] s?c kh?e nh? v?y.  Khng b? b?t k? li?u thu?c no.  Mua thm ?? thu?c theo ??n tr??c khi h?t thu?c. ?n v u?ng   ?n th?c ?n t?t cho tim. Trao ??i v?i chuyn gia dinh d??ng ?? ln k? ho?ch cho vi?c ?n u?ng ph h?p v?i qu v?. ? Ch?n th?c ph?m khng c ch?t bo khng bo ha v t ch?t bo bo ha c?ng nh?  cholesterol. L?a ch?n c l?i cho s?c kh?e bao g?m tri cy v rau t??i ho?c ?ng l?nh, c, th?t n?c, cc lo?i ??u, cc s?n ph?m s?a khng bo ho?c t ch?t bo v ng? c?c nguyn h?t ho?c cc lo?i th?c ph?m giu ch?t x?. ? H?n ch? mu?i (natri) n?u ???c chuyn gia ch?m [place] s?c kh?e ch? d?n. H?n ch? natri c th? lm gi?m tri?u ch?ng suy tim. ?? ngh? chuyn gia dinh d??ng cho l?i khuyn v? cc lo?i gia v? t?t cho tim. ? S? d?ng cc ph??ng php n?u ?n t?t cho s?c kh?e thay v chin/rn. Ph??ng php n?u ?n c l?i cho s?c kh?e bao g?m quay, n??ng trn v?, n??ng b?ng que xin, n??ng trong l/s?y, kho lu?c, h?p v xo.  H?n ch? l??ng n??c u?ng n?u ???c chuyn gia ch?m [place] s?c kh?e ch? d?n. H?n ch? n??c c th? lm gi?m tri?u ch?ng suy tim. S? d?ng r??u  Khng u?ng r??u n?u: ? Chuyn gia ch?m [place] s?c kh?e c?a qu v? khuyn qu v? khng u?ng r??u. ? Tim c?a qu v? b? t?n th??ng b?i r??u, ho?c qu v? b? suy tim n?ng. ? Qu v? c [place], c th? c [place], ho?c c k? ho?ch c [place].  N?u qu v? u?ng r??u: ? Gi?i h?n l??ng r??u qu v? u?ng ? m?c:  0-1 ly/ngy ??i v?i n? gi?i.  0-2 ly/ngy ??i v?i nam gi?i. ? Bi?t r m?t ly c bao nhiu r??u. ? M?, m?t ly t??ng ???ng v?i m?t chai bia 12 ao x? (355 mL), m?t ly r??u vang 5 ao x? (148 mL), ho?c m?t ly r??u m?nh 1 ao x? (44 mL). L?i s?ng   Khng s? d?ng b?t k? s?n ph?m no c nicotine ho?c thu?c l, ch?ng ha?n nh? thu?c l d?ng ht, thu?c l ?i?n t? v thu?c l d?ng nhai. N?u qu v? c?n gip ?? ?? cai thu?c, hy h?i chuyn gia ch?m [place] s?c kh?e. ? Khng s? d?ng k?o ho?c mi?ng dn c nicotine tr??c khi ni chuy?n v?i chuyn gia ch?m [place] s?c kh?e.  Khng s? d?ng ma ty b?t h?p php.  Trao ??i v?i chuyn gia ch?m [place] s?c kh?e ?? ??t ???c tr?ng l??ng c? th? ph h?p m?t cch an ton.  Th?c hi?n ho?t ??  ng th? ch?t n?u ???c chuyn gia ch?m [place] s?c kh?e ch? d?n. Trao ??i v?i chuyn gia ch?m [place] s?c kh?e tr??c khi b?t ??u t?p th? d?c n?u: ? Qu v? l ng??i l?n  tu?i. ? Qu v? b? suy tim n?ng.  H?c cch qu?n l c?ng th?ng. N?u qu v? c?n gip ??, hy h?i chuyn gia ch?m [place] s?c kh?e.  Tham gia ho?c tm cch ph?c h?i ch?c n?ng khi c?n thi?t ?? duy tr ho?c c?i thi?n kh? n?ng ??c l?p v ch?t l??ng cu?c s?ng c?a qu v?.  Ln k? ho?ch th?i gian ngh? ng?i khi qu v? m?t m?i. Theo di thng tin quan tr?ng   T? ki?m tra cn n?ng m?i ngy. Vi?c ny s? gip qu v? nh?n bi?t n?u c qu nhi?u ch?t l?ng ?ang tch t? trong c? th? qu v?. ? Qu v? nn t? ki?m tra cn n?ng m?i bu?i sng sau khi ?i ti?u v tr??c khi ?n sng. ? M?c cng m?t l??ng qu?n o m?i l?n t? ki?m tra cn n?ng. ? Ghi l?i cn n?ng hng ngy c?a qu v?. Cung c?p ph?n ghi chp cn n?ng c?a qu v? cho chuyn gia ch?m [place] s?c kh?e.  Theo di v ghi l?i m?ch v huy?t p theo ch? d?n c?a chuyn gia ch?m [place] s?c kh?e. Cch ??i ph v?i nhi?t ?? kh?c nghi?t  N?u th?i ti?t v cng nng: ? Trnh ho?t ??ng th? ch?t m?nh. ? S? d?ng ?i?u ha khng kh ho?c qu?t ho?c tm ch? mt h?n. ? Trnh caffeine v r??u. ? M?c qu?n o r?ng, nh? v sng mu.  N?u th?i ti?t v cng l?nh: ? Trnh ho?t ??ng m?nh. ? M?c thm o. ? ?eo g?ng tay, ??i m? v qung kh?n khi ?i ra ngoi. ? Trnh u?ng r??u. Tun th? nh?ng h??ng d?n ny ? nh:  Tim v?c xin ??y ??. V?c xin ph? c?u khu?n v cm (cm) ??c bi?t quan tr?ng trong vi?c ng?n ng?a nhi?m trng ???ng th?[place]na Masse th? t?t c? cc l?n khm theo di theo ch? d?n c?a chuyn gia ch?m [place] s?c kh?e. ?i?u ny c vai tr quan tr?ng. Hy lin l?c v?i chuyn gia ch?m  s?c kh?e n?u qu v?:  T?ng cn nhanh chng.  Kh th? t?ng ln.  Khng th? tham gia vo cc ho?t ??ng th? ch?t thng th??ng.  D? b? m?t.  Ho nhi?u h?n bnh th??ng, ??c bi?t l khi th?c hi?n ho?t ??ng th? ch?t.  Qu v? chn ?n ho?c c?m th?y bu?n nn.  B? s?ng ho?c s?ng nhi?u h?n ? cc vng nh? bn tay, bn chn, c? chn ho?c b?ng.  Khng th? ng? ???c v kh th?.  C?m th?y nh? tim ??p nhanh (?nh  tr?ng ng?c).  B? chng m?t ho?c chong vng khi ??ng ln. Yu c?u tr? gip ngay l?p t?c n?u qu v?:  B? kh th?.  Th?y ho?c gia ?nh qu v? th?y qu v? thay ??i nh?n th?c, ch?ng h?n nh? kh th?c d?y ho?c kh t?p trung.  B? ?au ho?c c?m th?y kh ch?u ? ng?c.  B? c?n ng?t x?u (ng?t). Nh?ng tri?u ch?ng ny c th? l bi?u hi?n c?a m?t v?n ?? nghim tr?ng c?n c?p c?u. Khng ch? xem tri?u ch?ng c h?t khng. Hy ?i khm ngay l?p t?c. G?i cho d?ch v? c?p c?u t?i ??a ph??ng (911 ? [place]?). Khng t? li xe ??n b?nh vi?n. Tm t?t  Suy tim l m?t  tnh tr?ng nghim tr?ng. ?? ch?m [place] b?n thn, qu v? c th? ???c yu c?u thay ??i ch? ?? ?n, dng m?t s? lo?i thu?c nh?t ??nh v th?c hi?n cc thay ??i khc v? l?i s?ng.  Dng cc lo?i thu?c c?a qu v? m?i ngy. Khng d?ng s? d?ng thu?c tr? khi ???c chuyn gia ch?m [place] s?c kh?e ch? d?n.  ?n th?c ph?m c l?i cho s?c kh?e bao g?m tri cy v rau t??i ho?c ?ng l?nh, c, th?t n?c, cc lo?i ??u, cc s?n ph?m s?a khng bo ho?c t ch?t bo v ng? c?c nguyn h?t ho?c cc lo?i th?c ph?m giu ch?t x?.  Ho?i chuyn gia ch?m so?c s??c kho?e xem quy? vi? co? bi? ha?n ch? gi? v? r??u khng. Qu v? c th? ph?i ng?ng u?ng r??u n?u qu v? b? suy tim n?ng.  Lin h? v?i chuyn gia ch?m [place] s?c kh?e n?u qu v? th?y c cc v?n ??, ch?ng h?n nh? t?ng cn nhanh ho?c nh?p ??p c?a tim nhanh. Yu c?u tr? gip ngay n?u qu v? ng?t x?u, ?au ng?c ho?c kh th?. Thng tin ny khng nh?m m?c ?ch thay th? cho l?i khuyn m chuyn gia ch?m [place] s?c kh?e ni v?i qu v?. Hy b?o ??m qu v? ph?i th?o lu?n b?t k? v?n ?? g m qu v? c v?i chuyn gia ch?m [place] s?c kh?e c?a qu v?. Document Released: 12/21/2018 Document Revised: 12/21/2018 Document Reviewed: 12/22/2018 Elsevier Patient Education  2020 [place], Daune Perch, NP  05/25/2019 11:11 AM    [place]

## 2019-05-24 NOTE — Patient Instructions (Addendum)
Medication Instructions:  START: Spironolactone 12.5 mg once a day   If you need a refill on your cardiac medications before your next appointment, please call your pharmacy.   Lab work: TODAY: LIPIDS & CMET  FUTURE: BMET in week  If you have labs (blood work) drawn today and your tests are completely normal, you will receive your results only by: Marland Kitchen MyChart Message (if you have MyChart) OR . A paper copy in the mail If you have any lab test that is abnormal or we need to change your treatment, we will call you to review the results.  Testing/Procedures: None   Follow-Up: At [facility], you and your health needs are our priority.  As part of our continuing mission to provide you with exceptional heart care, we have created designated Provider Care Teams.  These Care Teams include your primary Cardiologist (physician) and Advanced Practice Providers (APPs -  Physician Assistants and Nurse Practitioners) who all work together to provide you with the care you need, when you need it. You will need a follow up appointment in 3 weeks. You may see Dr. Radford Pax or one of the following Advanced Practice Providers on your designated Care Team:   [place], PA-C Melina Copa, PA-C . Ermalinda Barrios, PA-C  Any Other Special Instructions Will Be Listed Below (If Applicable).   Spironolactone tablets ?y l thu?c g? SPIRONOLACTONE l thu?c l?i ti?u. N gip qu v? ?i ti?u nhi?u h?n v lo?i b? l??ng n??c th?a ra kh?i c? th?. Thu?c ny ???c dng ?? ?i?u tr? huy?t p cao, v ch?ng ph ho?c s?ng do b?nh tim, th?n, ho?c gan. N c?ng ???c dng ?? ?i?u tr? cho nh?ng b?nh nhn c qu nhi?u aldosterone ho?c c m?c kali th?p. Thu?c ny c th? ???c dng cho nh?ng m?c ?ch khc; hy h?i ng??i cung c?p d?ch v? y t? ho?c d??c s? c?a mnh, n?u qu v? c th?c m?c. (CC) NHN HI?U PH? BI?N: Aldactone Ti c?n ph?i bo cho ng??i cung c?p d?ch v? y t? c?a mnh ?i?u g tr??c khi dng thu?c ny? H? c?n bi?t li?u  qu v? hi?n c b?t k? tnh tr?ng no sau ?y hay khng:  m?c kali trong mu cao  b?nh th?n ho?c c v?n ?? khi ?i ti?u  b?nh gan  pha?n ??ng b?t th???ng ho??c di? ??ng v??i spironolactone  pha?n ??ng b?t th???ng ho??c di? ??ng v??i ca?c d??c ph?m kha?c  pha?n ??ng b?t th???ng ho??c di? ??ng v??i th??c ph?m, thu?c nhu?m, ho??c ch?t ba?o qua?n  ?ang c thai ho??c ??nh co? thai  ?ang cho con bu? Ti nn s? d?ng thu?c ny nh? th? no? U?ng thu?c ny v?i m?t ly n??c. Hy lm theo cc h??ng d?n trn h?p thu?c ho?c nhn thu?c. Qu v? c th? u?ng thu?c ny cng ho?c khng cng v?i th?c ?n. N?u thu?c lm kh ch?u bao t? qu v?, th hy u?ng thu?c cng v?i th?c ?n. Khng ???c dng thu?c ny nhi?u l?n h?n ? ???c ch? d?n. Hy nh? r?ng qu v? s? c?n ?i ti?u nhi?u h?n sau khi dng thu?c ny. Khng nn dng thu?c ny vo th?i ?i?m no ? trong ngy m s? gy b?t ti?n cho qu v?. Khng nn dng vo gi? ?i ng?. Hy bn v?i bc s? nhi khoa c?a qu v? v? vi?c dng thu?c ny ? tr? em. Thu?c ny c th? ???c k toa trong nh?ng tr??ng h?p ch?n l?c, nh?ng c?n ph?i th?n tr?ng. Mikayla Reynolds  li?u: N?u qu v? cho r?ng mnh ? dng qu nhi?u thu?c ny, th hy lin l?c v?i trung tm ki?m sot ch?t ??c ho?c phng c?p c?u ngay l?p t?c. L?U : Thu?c ny ch? dnh ring cho qu v?. Khng chia s? thu?c ny v?i nh?ng ng??i khc. N?u ti l? qun m?t li?u th sao? N?u qu v? l? qun m?t li?u thu?c, hy dng li?u thu?c ? ngay khi c th?. N?u h?u nh? ? ??n gi? dng li?u thu?c k? ti?p, th ch? dng li?u thu?c k? ti?p ?Marland Kitchen Khng ???c dng li?u g?p ?i ho?c dng thm li?u. Nh?ng g c th? t??ng tc v?i thu?c ny? Khng ???c dng thu?c ny cng v?i b?t k? th? no sau ?y:  cidofovir  eplerenone  tranylcypromine Thu?c ny c?ng c th? t??ng tc v?i cc thu?c sau ?y:  aspirin  m?t s? thu?c dng ?? tr? ch?ng huy?t p cao ho?c b?nh tim, nh? l benazepril, lisinopril, losartan, valsartan  m?t s? thu?c ?i?u tr? ho?c phng ng?a  c?c mu ?ng, nh? l heparin v enoxaparin  cholestyramine  cyclosporine  digoxin  lithium  cc thu?c lm gin c? ?? gi?i ph?u  Cc thu?c khng vim khng ph?i steroid (Non steroidal anti-inflamation drug - NSAID), cc thu?c gi?m ?au v khng vim, ch?ng h?n nh? ibuprofen, naproxen  cc thu?c l?i ti?u khc  cc ch? ph?m b? sung kali  cc thu?c steroid, ch?ng h?n nh? prednisone ho?c cortisone  trimethoprim Danh sch ny c th? khng m t? ?? h?t cc t??ng tc c th? x?y ra. Hy ??a cho ng??i cung c?p d?ch v? y t? c?a mnh danh sch t?t c? cc thu?c, th?o d??c, cc thu?c khng c?n toa, ho?c cc ch? ph?m b? sung m qu v? dng. C?ng nn bo cho h? bi?t r?ng qu v? c ht thu?c, u?ng r??u, ho?c c s? d?ng ma ty tri php hay khng. Vi th? c th? t??ng tc v?i thu?c c?a qu v?. Ti c?n ph?i theo di ?i?u g trong khi dng thu?c ny? Hy ?i g?p bc s? ho?c [place] vin y t? ?? theo di ??nh k? s? c?i thi?n c?a qu v?. Hy ?o huy?t p nh? ? ???c ch? d?n. Hy h?i bc s? ho?c chuyn vin y t? ?? xem huy?t p c?a mnh nn ? m?c no, v khi no th qu v? c?n ph?i lin l?c v?i h?. Qu v? c th? c?n ph?i theo m?t ch? ?? ?n ??c bi?t trong khi dng thu?c ny. Hy h?i bc s?Mikayla Reynolds ra, hy h?i xem qu v? c?n ph?i u?ng bao nhiu ly n??c trong m?t ngy. Qu v? khng ???c ?? b? m?t n??c. Thu?c ny c th? lm cho qu v? c?m th?y b? l?n th?n, chng m?t ho?c chong vng. U?ng r??u v dng m?t s? thu?c c th? lm cho tnh tr?ng ny n?ng h?n. Khng ???c li xe, s? d?ng my mc, ho?c lm nh?ng vi?c c?n ph?i t?nh to cho t?i khi qu v? bi?t ???c thu?c ny ?nh h??ng ln qu v? nh? th? no. Khng ???c ??ng d?y ho?c ng?i d?y m?t cch nhanh chng. Ti c th? nh?n th?y nh?ng tc d?ng ph? no khi dng thu?c ny? Nh?ng tc d?ng ph? qu v? c?n ph?i bo cho bc s? ho?c chuyn vin y t? cng s?m cng t?t:  cc ph?n ?ng d? ?ng, ch?ng h?n nh? da b? n?i ban ho?c ng?a, n?i my ?ay, s?ng ? m?t, mi, l??i, ho?c h?ng  phn  c mu ?en, mu h?c n  tim ??p nhanh ho?c khng ??u  s?t  ?au c? b?p ho?c v?p b?  t ho?c c?m gic nh? b? ki?n b ? bn tay ho?c bn chn  kh th?  kh ?i ti?u  xu?t huy?t b?t th??ng  m?t m?i ho?c y?u ?t b?t th??ng Cc tc d?ng ph? khng c?n ph?i ch?m [place] y t? (hy bo cho bc s? ho?c chuyn vin y t?, n?u cc tc d?ng ph? ny ti?p di?n ho?c gy phi?n toi):  cc thay ??i v? gi?ng ni ho?c m?c lng  l l?n  bu?n ng?, chng m?t  kh mi?ng, kht n??c nhi?u h?n  v b? ph ??i ho?c  ?m  ?au ??u  kinh nguy?t khng ??u  s? kh kh?n v? tnh d?c, khng th? c??ng c?ng  kh ch?u ? bao t? Danh sch ny c th? khng m t? ?? h?t cc tc d?ng ph? c th? x?y ra. Xin g?i t?i bc s? c?a mnh ?? ???c c? v?n chuyn mn v? cc tc d?ng ph?Mikayla Reynolds v? c th? t??ng trnh cc tc d?ng ph? cho FDA theo s? [phone]. Ti nn c?t gi? thu?c c?a mnh ? ?u? ?? ngoi t?m tay tr? em. C?t gi? ? nhi?t ?? d??i 25 ?? C (78 ?? F). V?t b? t?t c? thu?c ch?a dng sau ngy h?t h?n in trn nhn thu?c ho?c bao thu?c. L?U : ?y l b?n tm t?t. N c th? khng bao hm t?t c? thng tin c th? c. N?u qu v? th?c m?c v? thu?c ny, xin trao ??i v?i bc s?, d??c s?, ho?c ng??i cung c?p d?ch v? y t? c?a mnh.  2020 Elsevier/Gold Standard (2017-02-17 00:00:00)  CAN USE MRS. DASH TO SEASON FOOD, AVOID EXCESS SALT INTAKE  Suy tim, T? ch?m [place] Heart Failure, Self Care Suy tim l m?t tnh tr?ng nghim tr?ng. Ti li?u ny gi?i thch nh?ng vi?c qu v? c?n lm ?? ch?m [place] b?n thn sau khi ???c ch?n ?on b? suy tim. Qu v? c th? ???c yu c?u thay ??i ch? ?? ?n, dng m?t s? lo?i thu?c nh?t ??nh v th?c hi?n nh?ng thay ??i l?i s?ng khc ?? cng kh?e m?nh cng t?t. Chuyn gia ch?m [place] s?c kh?e c?ng c th? c h??ng d?n c? th? h?n cho qu v?. Hy lin l?c v?i chuyn gia ch?m [place] s?c kh?e n?u qu v? c cc v?n ?? ho?c th?c m?c. C nh?ng nguy c? no? B? suy tim khi?n qu v? c nguy c? cao h?n g?p ph?i m?t s? v?n ?? nh?t  ??nh. Nh?ng v?n ?? ny c th? tr?m tr?ng h?n n?u qu v? khng ch?m [place] t?t cho b?n thn. Cc v?n ?? c th? bao g?m:  V?n ?? ?ng mu. V?n ?? ny c th? gy ??t qu?Marland Kitchen  T?n th??ng th?n, gan ho?c ph?i.  Nh?p tim b?t th??ng. Cc v?t d?ng c?n thi?t:  Cn ?? theo di cn n?ng.  Theo di huy?t p.  S? tay.  Thu?c. Cch ch?m [place] b?n thn khi qu v? b? suy tim Thu?c Ch? s? d?ng thu?c khng k ??n v thu?c k ??n theo ch? d?n c?a chuyn gia ch?m [place] s?c kh?e. Thu?c lm gi?m kh?i l??ng cng vi?c c?a tim, lm ch?m s? ti?n tri?n c?a suy tim v c?i thi?n cc tri?u ch?ng. Dng cc lo?i thu?c c?a qu v? m?i ngy.  Khng d?ng s? d?ng thu?c tr? khi c ch? d?n c?a chuyn gia ch?m  [place] s?c kh?e nh? v?y.  Khng b? b?t k? li?u thu?c no.  Mua thm ?? thu?c theo ??n tr??c khi h?t thu?c. ?n v u?ng   ?n th?c ?n t?t cho tim. Trao ??i v?i chuyn gia dinh d??ng ?? ln k? ho?ch cho vi?c ?n u?ng ph h?p v?i qu v?. ? Ch?n th?c ph?m khng c ch?t bo khng bo ha v t ch?t bo bo ha c?ng nh? cholesterol. L?a ch?n c l?i cho s?c kh?e bao g?m tri cy v rau t??i ho?c ?ng l?nh, c, th?t n?c, cc lo?i ??u, cc s?n ph?m s?a khng bo ho?c t ch?t bo v ng? c?c nguyn h?t ho?c cc lo?i th?c ph?m giu ch?t x?. ? H?n ch? mu?i (natri) n?u ???c chuyn gia ch?m [place] s?c kh?e ch? d?n. H?n ch? natri c th? lm gi?m tri?u ch?ng suy tim. ?? ngh? chuyn gia dinh d??ng cho l?i khuyn v? cc lo?i gia v? t?t cho tim. ? S? d?ng cc ph??ng php n?u ?n t?t cho s?c kh?e thay v chin/rn. Ph??ng php n?u ?n c l?i cho s?c kh?e bao g?m quay, n??ng trn v?, n??ng b?ng que xin, n??ng trong l/s?y, kho lu?c, h?p v xo.  H?n ch? l??ng n??c u?ng n?u ???c chuyn gia ch?m [place] s?c kh?e ch? d?n. H?n ch? n??c c th? lm gi?m tri?u ch?ng suy tim. S? d?ng r??u  Khng u?ng r??u n?u: ? Chuyn gia ch?m [place] s?c kh?e c?a qu v? khuyn qu v? khng u?ng r??u. ? Tim c?a qu v? b? t?n th??ng b?i r??u, ho?c qu v? b? suy tim n?ng. ? Qu v? c  [place], c th? c [place], ho?c c k? ho?ch c [place].  N?u qu v? u?ng r??u: ? Gi?i h?n l??ng r??u qu v? u?ng ? m?c:  0-1 ly/ngy ??i v?i n? gi?i.  0-2 ly/ngy ??i v?i nam gi?i. ? Bi?t r m?t ly c bao nhiu r??u. ? M?, m?t ly t??ng ???ng v?i m?t chai bia 12 ao x? (355 mL), m?t ly r??u vang 5 ao x? (148 mL), ho?c m?t ly r??u m?nh 1 ao x? (44 mL). L?i s?ng   Khng s? d?ng b?t k? s?n ph?m no c nicotine ho?c thu?c l, ch?ng ha?n nh? thu?c l d?ng ht, thu?c l ?i?n t? v thu?c l d?ng nhai. N?u qu v? c?n gip ?? ?? cai thu?c, hy h?i chuyn gia ch?m [place] s?c kh?e. ? Khng s? d?ng k?o ho?c mi?ng dn c nicotine tr??c khi ni chuy?n v?i chuyn gia ch?m [place] s?c kh?e.  Khng s? d?ng ma ty b?t h?p php.  Trao ??i v?i chuyn gia ch?m [place] s?c kh?e ?? ??t ???c tr?ng l??ng c? th? ph h?p m?t cch an ton.  Th?c hi?n ho?t ??ng th? ch?t n?u ???c chuyn gia ch?m [place] s?c kh?e ch? d?n. Trao ??i v?i chuyn gia ch?m [place] s?c kh?e tr??c khi b?t ??u t?p th? d?c n?u: ? Qu v? l ng??i l?n tu?i. ? Qu v? b? suy tim n?ng.  H?c cch qu?n l c?ng th?ng. N?u qu v? c?n gip ??, hy h?i chuyn gia ch?m [place] s?c kh?e.  Tham gia ho?c tm cch ph?c h?i ch?c n?ng khi c?n thi?t ?? duy tr ho?c c?i thi?n kh? n?ng ??c l?p v ch?t l??ng cu?c s?ng c?a qu v?.  Ln k? ho?ch th?i gian ngh? ng?i khi qu v? m?t m?i. Theo di thng tin quan tr?ng   T? ki?m tra cn n?ng m?i ngy. Vi?c ny s? gip qu v?  nh?n bi?t n?u c qu nhi?u ch?t l?ng ?ang tch t? trong c? th? qu v?. ? Qu v? nn t? ki?m tra cn n?ng m?i bu?i sng sau khi ?i ti?u v tr??c khi ?n sng. ? M?c cng m?t l??ng qu?n o m?i l?n t? ki?m tra cn n?ng. ? Ghi l?i cn n?ng hng ngy c?a qu v?. Cung c?p ph?n ghi chp cn n?ng c?a qu v? cho chuyn gia ch?m [place] s?c kh?e.  Theo di v ghi l?i m?ch v huy?t p theo ch? d?n c?a chuyn gia ch?m [place] s?c kh?e. Cch ??i ph v?i nhi?t ?? kh?c nghi?t  N?u th?i ti?t v cng nng: ? Trnh ho?t ??ng th? ch?t m?nh. ? S?  d?ng ?i?u ha khng kh ho?c qu?t ho?c tm ch? mt h?n. ? Trnh caffeine v r??u. ? M?c qu?n o r?ng, nh? v sng mu.  N?u th?i ti?t v cng l?nh: ? Trnh ho?t ??ng m?nh. ? M?c thm o. ? ?eo g?ng tay, ??i m? v qung kh?n khi ?i ra ngoi. ? Trnh u?ng r??u. Tun th? nh?ng h??ng d?n ny ? nh:  Tim v?c xin ??y ??. V?c xin ph? c?u khu?n v cm (cm) ??c bi?t quan tr?ng trong vi?c ng?n ng?a nhi?m trng ???ng th?Mikayla Reynolds th? t?t c? cc l?n khm theo di theo ch? d?n c?a chuyn gia ch?m [place] s?c kh?e. ?i?u ny c vai tr quan tr?ng. Hy lin l?c v?i chuyn gia ch?m [place] s?c kh?e n?u qu v?:  T?ng cn nhanh chng.  Kh th? t?ng ln.  Khng th? tham gia vo cc ho?t ??ng th? ch?t thng th??ng.  D? b? m?t.  Ho nhi?u h?n bnh th??ng, ??c bi?t l khi th?c hi?n ho?t ??ng th? ch?t.  Qu v? chn ?n ho?c c?m th?y bu?n nn.  B? s?ng ho?c s?ng nhi?u h?n ? cc vng nh? bn tay, bn chn, c? chn ho?c b?ng.  Khng th? ng? ???c v kh th?.  C?m th?y nh? tim ??p nhanh (?nh tr?ng ng?c).  B? chng m?t ho?c chong vng khi ??ng ln. Yu c?u tr? gip ngay l?p t?c n?u qu v?:  B? kh th?.  Th?y ho?c gia ?nh qu v? th?y qu v? thay ??i nh?n th?c, ch?ng h?n nh? kh th?c d?y ho?c kh t?p trung.  B? ?au ho?c c?m th?y kh ch?u ? ng?c.  B? c?n ng?t x?u (ng?t). Nh?ng tri?u ch?ng ny c th? l bi?u hi?n c?a m?t v?n ?? nghim tr?ng c?n c?p c?u. Khng ch? xem tri?u ch?ng c h?t khng. Hy ?i khm ngay l?p t?c. G?i cho d?ch v? c?p c?u t?i ??a ph??ng (911 ? [place] Khng t? li xe ??n b?nh vi?n. Tm t?t  Suy tim l m?t tnh tr?ng nghim tr?ng. ?? ch?m [place] b?n thn, qu v? c th? ???c yu c?u thay ??i ch? ?? ?n, dng m?t s? lo?i thu?c nh?t ??nh v th?c hi?n cc thay ??i khc v? l?i s?ng.  Dng cc lo?i thu?c c?a qu v? m?i ngy. Khng d?ng s? d?ng thu?c tr? khi ???c chuyn gia ch?m [place] s?c kh?e ch? d?n.  ?n th?c ph?m c l?i cho s?c kh?e bao g?m tri cy v rau t??i ho?c ?ng l?nh, c, th?t n?c, cc  lo?i ??u, cc s?n ph?m s?a khng bo ho?c t ch?t bo v ng? c?c nguyn h?t ho?c cc lo?i th?c ph?m giu ch?t x?.  Ho?i chuyn gia ch?m so?c s??c kho?e xem quy? vi? co? bi? ha?n ch? gi? v? r??u  Mikayla Reynolds v? c th? ph?i ng?ng u?ng r??u n?u qu v? b? suy tim n?ng.  Lin h? v?i chuyn gia ch?m [place] s?c kh?e n?u qu v? th?y c cc v?n ??, ch?ng h?n nh? t?ng cn nhanh ho?c nh?p ??p c?a tim nhanh. Yu c?u tr? gip ngay n?u qu v? ng?t x?u, ?au ng?c ho?c kh th?. Thng tin ny khng nh?m m?c ?ch thay th? cho l?i khuyn m chuyn gia ch?m [place] s?c kh?e ni v?i qu v?. Hy b?o ??m qu v? ph?i th?o lu?n b?t k? v?n ?? g m qu v? c v?i chuyn gia ch?m [place] s?c kh?e c?a qu v?. Document Released: 12/21/2018 Document Revised: 12/21/2018 Document Reviewed: 12/22/2018 Elsevier Patient Education  2020 [employer].

## 2019-05-25 ENCOUNTER — Encounter: Payer: Self-pay | Admitting: Cardiology

## 2019-05-25 LAB — COMPREHENSIVE METABOLIC PANEL
ALT: 21 IU/L (ref 0–32)
AST: 48 IU/L — ABNORMAL HIGH (ref 0–40)
Albumin/Globulin Ratio: 0.9 — ABNORMAL LOW (ref 1.2–2.2)
Albumin: 4.4 g/dL (ref 3.7–4.7)
Alkaline Phosphatase: 228 IU/L — ABNORMAL HIGH (ref 39–117)
BUN/Creatinine Ratio: 10 — ABNORMAL LOW (ref 12–28)
BUN: 12 mg/dL (ref 8–27)
Bilirubin Total: 2.4 mg/dL — ABNORMAL HIGH (ref 0.0–1.2)
CO2: 24 mmol/L (ref 20–29)
Calcium: 9.2 mg/dL (ref 8.7–10.3)
Chloride: 97 mmol/L (ref 96–106)
Creatinine, Ser: 1.18 mg/dL — ABNORMAL HIGH (ref 0.57–1.00)
GFR calc Af Amer: 52 mL/min/{1.73_m2} — ABNORMAL LOW (ref >59)
GFR calc non Af Amer: 45 mL/min/{1.73_m2} — ABNORMAL LOW (ref >59)
Globulin, Total: 4.9 g/dL — ABNORMAL HIGH (ref 1.5–4.5)
Glucose: 98 mg/dL (ref 65–99)
Potassium: 4.2 mmol/L (ref 3.5–5.2)
Sodium: 134 mmol/L (ref 134–144)
Total Protein: 9.3 g/dL — ABNORMAL HIGH (ref 6.0–8.5)

## 2019-05-25 LAB — LIPID PANEL
Chol/HDL Ratio: 2.4 ratio (ref 0.0–4.4)
Cholesterol, Total: 147 mg/dL (ref 100–199)
HDL: 61 mg/dL (ref >39)
LDL Chol Calc (NIH): 73 mg/dL (ref 0–99)
Triglycerides: 63 mg/dL (ref 0–149)
VLDL Cholesterol Cal: 13 mg/dL (ref 5–40)

## 2019-05-25 NOTE — Progress Notes (Signed)
Carelink Summary Report / Loop Recorder 

## 2019-05-31 ENCOUNTER — Other Ambulatory Visit: Payer: Self-pay

## 2019-05-31 ENCOUNTER — Other Ambulatory Visit: Payer: Medicaid Other | Admitting: *Deleted

## 2019-05-31 DIAGNOSIS — I10 Essential (primary) hypertension: Secondary | ICD-10-CM

## 2019-06-01 LAB — BASIC METABOLIC PANEL
BUN/Creatinine Ratio: 15 (ref 12–28)
BUN: 18 mg/dL (ref 8–27)
CO2: 22 mmol/L (ref 20–29)
Calcium: 9.7 mg/dL (ref 8.7–10.3)
Chloride: 97 mmol/L (ref 96–106)
Creatinine, Ser: 1.24 mg/dL — ABNORMAL HIGH (ref 0.57–1.00)
GFR calc Af Amer: 49 mL/min/{1.73_m2} — ABNORMAL LOW (ref >59)
GFR calc non Af Amer: 42 mL/min/{1.73_m2} — ABNORMAL LOW (ref >59)
Glucose: 82 mg/dL (ref 65–99)
Potassium: 4.6 mmol/L (ref 3.5–5.2)
Sodium: 135 mmol/L (ref 134–144)

## 2019-06-03 ENCOUNTER — Telehealth: Payer: Self-pay

## 2019-06-03 DIAGNOSIS — I10 Essential (primary) hypertension: Secondary | ICD-10-CM

## 2019-06-03 NOTE — Telephone Encounter (Addendum)
Pt aware of results via interpreter. Pt verbalized understanding. Pt is scheduled to see Melina Copa on PA-C on 10/13 pt will have labs the same day.    ----- Message from Daune Perch, NP sent at 06/02/2019  7:58 AM EDT ----- Labs since starting spironolactone are stable with only mild increase in creatinine. Potassium is normal. She should have BMet follow up at office visit on 10/13 with Melina Copa, PA and assess for tolerance to HF medical therapy. Could possibly need decrease in lasix if getting more dry.  I will route this note to [place].   Daune Perch, NP

## 2019-06-03 NOTE — Addendum Note (Signed)
Addended by: Mendel Ryder on: 06/03/2019 09:08 AM   Modules accepted: Orders

## 2019-06-07 ENCOUNTER — Encounter: Payer: Self-pay | Admitting: Internal Medicine

## 2019-06-07 ENCOUNTER — Ambulatory Visit (HOSPITAL_BASED_OUTPATIENT_CLINIC_OR_DEPARTMENT_OTHER): Payer: Medicaid Other | Admitting: Pharmacist

## 2019-06-07 ENCOUNTER — Ambulatory Visit: Payer: Medicaid Other | Attending: Internal Medicine | Admitting: Internal Medicine

## 2019-06-07 ENCOUNTER — Other Ambulatory Visit: Payer: Self-pay

## 2019-06-07 VITALS — BP 102/63 | HR 58 | Temp 98.2°F | Ht 63.0 in | Wt 114.4 lb

## 2019-06-07 DIAGNOSIS — Z23 Encounter for immunization: Secondary | ICD-10-CM

## 2019-06-07 DIAGNOSIS — I10 Essential (primary) hypertension: Secondary | ICD-10-CM | POA: Diagnosis not present

## 2019-06-07 DIAGNOSIS — I130 Hypertensive heart and chronic kidney disease with heart failure and stage 1 through stage 4 chronic kidney disease, or unspecified chronic kidney disease: Secondary | ICD-10-CM | POA: Diagnosis not present

## 2019-06-07 DIAGNOSIS — I5042 Chronic combined systolic (congestive) and diastolic (congestive) heart failure: Secondary | ICD-10-CM | POA: Diagnosis not present

## 2019-06-07 DIAGNOSIS — E1122 Type 2 diabetes mellitus with diabetic chronic kidney disease: Secondary | ICD-10-CM | POA: Diagnosis not present

## 2019-06-07 DIAGNOSIS — Z2821 Immunization not carried out because of patient refusal: Secondary | ICD-10-CM

## 2019-06-07 DIAGNOSIS — N2889 Other specified disorders of kidney and ureter: Secondary | ICD-10-CM | POA: Diagnosis not present

## 2019-06-07 DIAGNOSIS — Z7982 Long term (current) use of aspirin: Secondary | ICD-10-CM | POA: Insufficient documentation

## 2019-06-07 DIAGNOSIS — N1831 Chronic kidney disease, stage 3a: Secondary | ICD-10-CM

## 2019-06-07 DIAGNOSIS — I429 Cardiomyopathy, unspecified: Secondary | ICD-10-CM | POA: Insufficient documentation

## 2019-06-07 DIAGNOSIS — Z79899 Other long term (current) drug therapy: Secondary | ICD-10-CM | POA: Diagnosis not present

## 2019-06-07 NOTE — Progress Notes (Signed)
Patient ID: Mikayla Reynolds, female    DOB: 13-Dec-1940  MRN: [id number]  CC:  Chronic ds management  Subjective: Mikayla Reynolds is a 78 y.o. female who presents for chronic ds management.  Son, Lance Bosch,  is with her.  Her concerns today include:  HTN, CVART MCAloop recorder in place, pre-DM, CKD stage 3, comb sys/dias CHF, RT kidney mass.  Combined systolic/diastolic CHF/HTN: She has seen cardiology a few times since last visit with me.  Recent visit with cardiologist NP was 05/24/2019.  Low-dose Spironolactone was added.  Repeat BMP showed kidney function holding fairly stable. -She denies any chest pains or shortness of breath at this time.  No lower extremity edema.  No PND orthopnea.    Saw GI Dr. Loletha Carrow for hepatomegaly and abnormal LFTs with early satiety.  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  Due to poor prognosis.  He did not favor doing endoscopic work-up.  Senna was discontinued and patient placed on MiraLAX instead for constipation. Patient reports good appetite.  She drinks Ensure supplements with her meals.  She denies any falls.  She ambulates with a cane.  Renal mass: No aggressive measures are planned at this time.  She had seen the urologist back in August.  They plan to repeat CAT scan in 6 months.  Patient states she is in agreement with this.   Patient Active Problem List   Diagnosis Date Noted   Cardiomyopathy ([place]) 04/09/2019   Right kidney mass 04/09/2019   History of loop recorder 03/11/2019   Macrocytic anemia 03/11/2019   CKD (chronic kidney disease), stage III 02/19/2018   Stroke ([place]) 12/27/2017   Elevated troponin 12/27/2017   Chest pain 12/27/2017   Essential hypertension      Current Outpatient Medications on File Prior to Visit  Medication Sig Dispense Refill   aspirin EC 81 MG tablet Take 1 tablet (81 mg total) by mouth daily. 100 tablet 1   atorvastatin (LIPITOR) 40 MG tablet Take 1 tablet  (40 mg total) by mouth daily at 6 PM. 90 tablet 3   furosemide (LASIX) 20 MG tablet Take 1 tablet (20 mg total) by mouth daily. May take an extra tablet for swelling as needed 90 tablet 3   lisinopril (ZESTRIL) 5 MG tablet Take 1 tablet (5 mg total) by mouth daily. 90 tablet 3   spironolactone (ALDACTONE) 25 MG tablet Take 0.5 tablets (12.5 mg total) by mouth daily. 90 tablet 3   No current facility-administered medications on file prior to visit.     No Known Allergies  Social History   Socioeconomic History   Marital status: Widowed    Spouse name: Not on file   Number of children: Not on file   Years of education: Not on file   Highest education level: Not on file  Occupational History   Not on file  Social Needs   Financial resource strain: Not on file   Food insecurity    Worry: Not on file    Inability: Not on file   Transportation needs    Medical: Not on file    Non-medical: Not on file  Tobacco Use   Smoking status: Never Smoker   Smokeless tobacco: Never Used  Substance and Sexual Activity   Alcohol use: Not Currently   Drug use: Not Currently   Sexual activity: Not Currently  Lifestyle   Physical activity    Days per week: Not on file  Minutes per session: Not on file   Stress: Not on file  Relationships   Social connections    Talks on phone: Not on file    Gets together: Not on file    Attends religious service: Not on file    Active member of club or organization: Not on file    Attends meetings of clubs or organizations: Not on file    Relationship status: Not on file   Intimate partner violence    Fear of current or ex partner: Not on file    Emotionally abused: Not on file    Physically abused: Not on file    Forced sexual activity: Not on file  Other Topics Concern   Not on file  Social History Narrative   Not on file    Family History  Problem Relation Age of Onset   Healthy Sister    Healthy Brother      Past Surgical History:  Procedure Laterality Date   abdominal wall surgery  2017   APPENDECTOMY     LOOP RECORDER INSERTION N/A 06/23/2018   Procedure: LOOP RECORDER INSERTION;  Surgeon: [name], MD;  Location: [facility];  Service: Cardiovascular;  Laterality: N/A;   TEE WITHOUT CARDIOVERSION N/A 06/23/2018   Procedure: TRANSESOPHAGEAL ECHOCARDIOGRAM (TEE);  Surgeon: [name], MD;  Location: [facility] END[facility];  Service: Cardiovascular;  Laterality: N/A;    ROS: Review of Systems Negative except as stated above  PHYSICAL EXAM: BP 102/63 (BP Location: Left Arm, Patient Position: Sitting, Cuff Size: Normal)    Pulse (!) 58    Temp 98.2 F (36.8 C) (Oral)    Ht 5\' 3"  (1.6 m)    Wt 114 lb 6.4 oz (51.9 kg)    SpO2 100%    BMI 20.27 kg/m   Wt Readings from Last 3 Encounters:  06/07/19 114 lb 6.4 oz (51.9 kg)  05/24/19 120 lb 3.2 oz (54.5 kg)  05/20/19 120 lb 3.2 oz (54.5 kg)    Physical Exam  General appearance -frail elderly female in NAD.  She is alert and answers questions appropriately.   Mental status - normal mood, behavior, speech, dress, motor activity, and thought processes Chest - clear to auscultation, no wheezes, rales or rhonchi, symmetric air entry Heart - normal rate, regular rhythm, normal S1, S2, no murmurs, rubs, clicks or gallops Extremities - peripheral pulses normal, no pedal edema, no clubbing or cyanosis   CMP Latest Ref Rng & Units 05/31/2019 05/24/2019 04/20/2019  Glucose 65 - 99 mg/dL 82 98 138(H)  BUN 8 - 27 mg/dL 18 12 24   Creatinine 0.57 - 1.00 mg/dL 1.24(H) 1.18(H) 1.12(H)  Sodium 134 - 144 mmol/L 135 134 137  Potassium 3.5 - 5.2 mmol/L 4.6 4.2 4.4  Chloride 96 - 106 mmol/L 97 97 99  CO2 20 - 29 mmol/L 22 24 23   Calcium 8.7 - 10.3 mg/dL 9.7 9.2 9.1  Total Protein 6.0 - 8.5 g/dL - 9.3(H) -  Total Bilirubin 0.0 - 1.2 mg/dL - 2.4(H) -  Alkaline Phos 39 - 117 IU/L - 228(H) -  AST 0 - 40 IU/L - 48(H) -  ALT 0 - 32 IU/L - 21 -    Lipid Panel     Component Value Date/Time   CHOL 147 05/24/2019 1628   TRIG 63 05/24/2019 1628   HDL 61 05/24/2019 1628   CHOLHDL 2.4 05/24/2019 1628   CHOLHDL 4.9 12/28/2017 0529   VLDL 15 12/28/2017 0529   LDLCALC  73 05/24/2019 1628    CBC    Component Value Date/Time   WBC 3.3 (L) 03/11/2019 1157   WBC 5.3 12/28/2017 0529   RBC 3.41 (L) 03/11/2019 1157   RBC 3.90 12/28/2017 0529   HGB 11.6 03/11/2019 1157   HCT 33.5 (L) 03/11/2019 1157   PLT 159 03/11/2019 1157   MCV 98 (H) 03/11/2019 1157   MCH 34.0 (H) 03/11/2019 1157   MCH 31.3 12/28/2017 0529   MCHC 34.6 03/11/2019 1157   MCHC 32.9 12/28/2017 0529   RDW 13.7 03/11/2019 1157   LYMPHSABS 0.7 03/11/2019 1157   MONOABS 0.5 12/27/2017 1252   EOSABS 0.1 03/11/2019 1157   BASOSABS 0.1 03/11/2019 1157    ASSESSMENT AND PLAN: 1. Essential hypertension At goal.  She will continue the low-dose of lisinopril, spironolactone torsemide.  DASH diet discussed and encourage  2. Right kidney mass Poor candidate for surgery.  Urology plans to repeat imaging study in about 6 months  3. Stage 3a chronic kidney disease Continue to monitor especially with her being on diuretics  4. Chronic combined systolic and diastolic CHF (congestive heart failure) ([place]) See #1 above Clinically compensated  5. Need for influenza vaccination - Flu Vaccine QUAD 6+ mos PF IM (Fluarix Quad PF)  6. 23-polyvalent pneumococcal polysaccharide vaccine declined Given  Patient was given the opportunity to ask questions.  Patient verbalized understanding of the plan and was able to repeat key elements of the plan.   Orders Placed This Encounter  Procedures   Flu Vaccine QUAD 6+ mos PF IM (Fluarix Quad PF)     Requested Prescriptions    No prescriptions requested or ordered in this encounter    Return in about 3 months (around 09/07/2019).  Karle Plumber, MD, FACP

## 2019-06-07 NOTE — Progress Notes (Signed)
Patient presents for vaccination against Pneumovax per orders of Dr. Wynetta Emery. Consent given. Counseling provided. No contraindications exists. Vaccine administered without incident.

## 2019-06-13 ENCOUNTER — Encounter: Payer: Self-pay | Admitting: Physician Assistant

## 2019-06-13 NOTE — Progress Notes (Signed)
Cardiology Office Note    Date:  06/15/2019   ID:  Mikayla Reynolds, DOB Jan 24, 1941, MRN [id number]  PCP:  Ladell Pier, MD  Cardiologist/EP:  [name], MD - someone had listed Dr. Radford Pax as her primary cardiologist but this is incorrect - patient has never seen Dr . Radford Pax aside from provision of TEE. She actually saw Dr. Debara Pickett in the hospital in 12/2017 in rounds.  Chief Complaint: f/u CHF  History of Present Illness:   Mikayla Reynolds is a 78 y.o. female with history of CKD stage III, prediabetes, hyperlipidemia, cryptogenic stroke s/p ILR, renal mass, chronic combined CHF, mitral regurgitation, junctional bradycardia who presents for f/u of CHF.  She has not been followed by general cardiology traditionally in the past, only EP. She was seen in rounds in 12/2017 by Dr. Debara Pickett for elevated troponin in context of stroke. It was doubted that this was related to ACS; recommendation was to consider OP stress test. I do not see she followed up with general cards after that. She had loop recorder inserted in 06/2018 as part of work-up for stroke - at that time, EF was 50-55% by TEE. She has subsequently followed with EP.  Recent cardiology notes reviewed and summarized. Over the summer she has had issues with poor appetite and weight loss as well as progressive abdominal fullness and anemia. Her PCP obtained an echocardiogram 04/06/2019, which showed newly decreased LV systolic function, EF 0000000, moderate asymmetric LV hypertrophy, diastolic dysfunction and mild to moderate mitral regurgitation. She was also found to have a renal mass. She was started on ACEI and Lasix. Initial invasive work-up was deferred due to renal mass of unclear etiology pending work-up. She was seen on 04/21/2019 by [name], PA. It was noted that the nephrologist wanted to protect her kidneys. She was felt to be a poor candidate for surgery due to overall frailty, systolic heart failure and poor HD candidate if needed  postop (if the kidney would need to be removed). She was overall feeling better on medications. Jonni Sanger spoke with Dr. 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 He planned to repeat imaging in 6 months and repeat surgery (?unclear if typo). He agreed with medical management of her CHF and does not have any specific concerns of using ACE/ARB or Spironolactone. It was felt her prognosis would be more clear based on repeat imaging. She was not put on beta blocker as she had had some junctional bradycardia on an EKG from 04/2019. Her ILR has not demonstrated any significant bradycardia however. The patient was seen by GI on 05/20/2019 for abdominal bloating, the patient, abnormal LFTs and hepatomegaly and was advised to take MiraLAX. She followed up with Pecolia Ades, NP, on 05/24/19 and was felt to be doing well. Spironolactone was added. Last labs 05/2019 showed K 4.6, Cr 1.24 (similar to prior trends post spiro initiation), LDL 73, abnormal LFTs, 03/2019 Hgb 11.6.  She returns for follow-up today with a Optometrist. She reports she's doing much better with the medication changes that have been made. Her breathing and sense of wellbeing are better after the spironolactone. She also found the Miralax helped restore regular BMs. No SOB, abdominal distention, edema, or chest pain. No palpitations. She has noticed she's a little more tired than usual, having to nap when she otherwise wouldn't. Her blood pressure is on the soft side today. I rechecked it and got  102/64 with the pediatric cuff. She only eats meals twice a day due to poor appetite. She does not like the taste of nutrition shakes.   Past Medical History:  Diagnosis Date   Abnormal liver function    Anemia, unspecified    Chronic combined systolic and diastolic CHF (congestive heart failure) (HCC)    CKD (chronic kidney disease), stage III 02/19/2018   Elevated  troponin 12/27/2017   Hyperlipidemia    Junctional bradycardia    Mitral regurgitation    Pre-diabetes    Renal mass    Stroke [facility])     Past Surgical History:  Procedure Laterality Date   abdominal wall surgery  2017   APPENDECTOMY     LOOP RECORDER INSERTION N/A 06/23/2018   Procedure: LOOP RECORDER INSERTION;  Surgeon: [name], MD;  Location: [facility];  Service: Cardiovascular;  Laterality: N/A;   TEE WITHOUT CARDIOVERSION N/A 06/23/2018   Procedure: TRANSESOPHAGEAL ECHOCARDIOGRAM (TEE);  Surgeon: [name], MD;  Location: [facility] END[facility];  Service: Cardiovascular;  Laterality: N/A;    Current Medications: Current Meds  Medication Sig   aspirin EC 81 MG tablet Take 1 tablet (81 mg total) by mouth daily.   atorvastatin (LIPITOR) 40 MG tablet Take 1 tablet (40 mg total) by mouth daily at 6 PM.   lisinopril (ZESTRIL) 5 MG tablet Take 1 tablet (5 mg total) by mouth daily.   spironolactone (ALDACTONE) 25 MG tablet Take 0.5 tablets (12.5 mg total) by mouth daily.   [DISCONTINUED] furosemide (LASIX) 20 MG tablet Take 1 tablet (20 mg total) by mouth daily. May take an extra tablet for swelling as needed      Allergies:   Patient has no known allergies.   Social History   Socioeconomic History   Marital status: Widowed    Spouse name: Not on file   Number of children: Not on file   Years of education: Not on file   Highest education level: Not on file  Occupational History   Not on file  Social Needs   Financial resource strain: Not on file   Food insecurity    Worry: Not on file    Inability: Not on file   Transportation needs    Medical: Not on file    Non-medical: Not on file  Tobacco Use   Smoking status: Never Smoker   Smokeless tobacco: Never Used  Substance and Sexual Activity   Alcohol use: Not Currently   Drug use: Not Currently   Sexual activity: Not Currently  Lifestyle   Physical activity    Days per week:  Not on file    Minutes per session: Not on file   Stress: Not on file  Relationships   Social connections    Talks on phone: Not on file    Gets together: Not on file    Attends religious service: Not on file    Active member of club or organization: Not on file    Attends meetings of clubs or organizations: Not on file    Relationship status: Not on file  Other Topics Concern   Not on file  Social History Narrative   Not on file     Family History:  The patient's family history includes Healthy in her brother and sister.  ROS:   Please see the history of present illness.  All other systems are reviewed and otherwise negative.    EKGs/Labs/Other Studies Reviewed:    Studies reviewed were summarized above.  EKG:  EKG is not ordered today  Recent Labs: 03/11/2019: Hemoglobin 11.6; Platelets 159; TSH 2.420 04/02/2019: BNP 591.2 05/24/2019: ALT 21 05/31/2019: BUN 18; Creatinine, Ser 1.24; Potassium 4.6; Sodium 135  Recent Lipid Panel    Component Value Date/Time   CHOL 147 05/24/2019 1628   TRIG 63 05/24/2019 1628   HDL 61 05/24/2019 1628   CHOLHDL 2.4 05/24/2019 1628   CHOLHDL 4.9 12/28/2017 0529   VLDL 15 12/28/2017 0529   LDLCALC 73 05/24/2019 1628    PHYSICAL EXAM:    VS:  BP 98/60    Pulse (!) 53    Ht 5\' 3"  (1.6 m)    Wt 114 lb 12.8 oz (52.1 kg)    SpO2 98%    BMI 20.34 kg/m   BMI: Body mass index is 20.34 kg/m.  GEN: Cachectic appearing thin [place] F in no acute distress HEENT: normocephalic, atraumatic Neck: no JVD, carotid bruits, or masses Cardiac: Reg rhythm, sinus bradycardia; no murmurs, rubs, or gallops, no edema  Respiratory:  clear to auscultation bilaterally, normal work of breathing GI: soft, nontender, nondistended, + BS MS: no deformity or atrophy Skin: warm and dry, no rash Neuro:  Alert and Oriented x 3, Strength and sensation are intact, follows commands Psych: euthymic mood, full affect  Wt Readings from Last 3 Encounters:    06/15/19 114 lb 12.8 oz (52.1 kg)  06/07/19 114 lb 6.4 oz (51.9 kg)  05/24/19 120 lb 3.2 oz (54.5 kg)     ASSESSMENT & PLAN:   1. Chronic combined CHF - appears euvolemic. Her blood pressure is on the softer side and she is feeling some generalized fatigue. I think the spironolactone has made her feel better so I will leave that alone for now. I will change Lasix to every other day given her softer BP. Her oral intake isn't the best either. I did give instructions that she can take 1 additional tablet if needed for increased swelling, SOB, or abdominal distention. She will call for any signs of fluid retention. Translator reports granddaughter helps with home care so gave her one of my CHF reminder sheet (which reviews 2g sodium restriction, 2L fluid restriction, daily weights). Dr. Rayann Heman previously recommended to defer cath until more information is known about her renal mass. It sounds like repeat imaging is planned in 6 months to help better guide prognosis. She appears quite frail in general. In absence of ischemic symptoms or angina, would favor conservative therapy for now. I do suspect her fatigue is multifactorial. Will obtain f/u BMET, CBC. I share Dr. Jackalyn Lombard concerns over long term prognosis.She has lost about 20lb since the start of the year. Will plan repeat echo at end of December to reassess LVEF.  2. Junctional bradycardia - asymptomatic, was noted on monitor per EP PA notes. Continue to follow with ILR. She is not on BB for this reason. 3. CKD stage III - recheck kidney function today. 4. Mitral regurgitation - no significant murmur on exam. Follow conservatively for now. She denies any further SOB or chest discomfort.  Disposition: F/u with me after echo. I discussed with the patient that it may be that Dr. Debara Pickett is actually her general cardiologist at [facility] since he saw her in rounds 12/2018. She does not have a preference of location but the translator (who has actually  followed her through several other visits) worries she would get confused with the change in location. I'll reach out to Dr. Rayann Heman to find out if he  is okay with co-managing through APP team.  Medication Adjustments/Labs and Tests Ordered: Current medicines are reviewed at length with the patient today.  Concerns regarding medicines are outlined above. Medication changes, Labs and Tests ordered today are summarized above and listed in the Patient Instructions accessible in Encounters.   Signed, Charlie Pitter, PA-C  06/15/2019 2:45 PM    [facility] [address], [place], [place]  [address] Phone: [phone]; Fax: [phone]

## 2019-06-15 ENCOUNTER — Other Ambulatory Visit: Payer: Self-pay

## 2019-06-15 ENCOUNTER — Ambulatory Visit: Payer: Self-pay | Admitting: Adult Health

## 2019-06-15 ENCOUNTER — Other Ambulatory Visit: Payer: Medicaid Other

## 2019-06-15 ENCOUNTER — Encounter: Payer: Self-pay | Admitting: Physician Assistant

## 2019-06-15 ENCOUNTER — Telehealth: Payer: Self-pay

## 2019-06-15 ENCOUNTER — Ambulatory Visit (INDEPENDENT_AMBULATORY_CARE_PROVIDER_SITE_OTHER): Payer: Medicaid Other | Admitting: Physician Assistant

## 2019-06-15 VITALS — BP 98/60 | HR 53 | Ht 63.0 in | Wt 114.8 lb

## 2019-06-15 DIAGNOSIS — R5383 Other fatigue: Secondary | ICD-10-CM | POA: Diagnosis not present

## 2019-06-15 DIAGNOSIS — R001 Bradycardia, unspecified: Secondary | ICD-10-CM

## 2019-06-15 DIAGNOSIS — I5042 Chronic combined systolic (congestive) and diastolic (congestive) heart failure: Secondary | ICD-10-CM

## 2019-06-15 DIAGNOSIS — N183 Chronic kidney disease, stage 3 unspecified: Secondary | ICD-10-CM

## 2019-06-15 DIAGNOSIS — I340 Nonrheumatic mitral (valve) insufficiency: Secondary | ICD-10-CM | POA: Diagnosis not present

## 2019-06-15 MED ORDER — FUROSEMIDE 20 MG PO TABS: 20.0000 mg | ORAL_TABLET | ORAL | 3 refills | Status: DC

## 2019-06-15 NOTE — Patient Instructions (Addendum)
Medication Instructions:  Your physician has recommended you make the following change in your medication:  1.  DECREASE the Lasix to every other day   If you need a refill on your cardiac medications before your next appointment, please call your pharmacy.   Lab work: TODAY:  BMET & CBC  If you have labs (blood work) drawn today and your tests are completely normal, you will receive your results only by: Marland Kitchen MyChart Message (if you have MyChart) OR . A paper copy in the mail If you have any lab test that is abnormal or we need to change your treatment, we will call you to review the results.  Testing/Procedures: Your physician has requested that you have an echocardiogram. 08/30/2019 ARRIVE AT 11:05 FOR THIS TEST.   Echocardiography is a painless test that uses sound waves to create images of your heart. It provides your doctor with information about the size and shape of your heart and how well your heart's chambers and valves are working. This procedure takes approximately one hour. There are no restrictions for this procedure.    Follow-Up: At [facility], you and your health needs are our priority.  As part of our continuing mission to provide you with exceptional heart care, we have created designated Provider Care Teams.  These Care Teams include your primary Cardiologist (physician) and Advanced Practice Providers (APPs -  Physician Assistants and Nurse Practitioners) who all work together to provide you with the care you need, when you need it. .  You will need a follow-up appointment after your Echocardiogram in December.  I will have to call you with this once our schedules are open  Any Other Special Instructions Will Be Listed Below (If Applicable).  Echocardiogram An echocardiogram is a procedure that uses painless sound waves (ultrasound) to produce an image of the heart. Images from an echocardiogram can provide important information about:  Signs of coronary artery  disease (CAD).  Aneurysm detection. An aneurysm is a weak or damaged part of an artery wall that bulges out from the normal force of blood pumping through the body.  Heart size and shape. Changes in the size or shape of the heart can be associated with certain conditions, including heart failure, aneurysm, and CAD.  Heart muscle function.  Heart valve function.  Signs of a past heart attack.  Fluid buildup around the heart.  Thickening of the heart muscle.  A tumor or infectious growth around the heart valves. Tell a health care provider about:  Any allergies you have.  All medicines you are taking, including vitamins, herbs, eye drops, creams, and over-the-counter medicines.  Any blood disorders you have.  Any surgeries you have had.  Any medical conditions you have.  Whether you are pregnant or may be pregnant. What are the risks? Generally, this is a safe procedure. However, problems may occur, including:  Allergic reaction to dye (contrast) that may be used during the procedure. What happens before the procedure? No specific preparation is needed. You may eat and drink normally. What happens during the procedure?   An IV tube may be inserted into one of your veins.  You may receive contrast through this tube. A contrast is an injection that improves the quality of the pictures from your heart.  A gel will be applied to your chest.  A wand-like tool (transducer) will be moved over your chest. The gel will help to transmit the sound waves from the transducer.  The sound waves will  harmlessly bounce off of your heart to allow the heart images to be captured in real-time motion. The images will be recorded on a computer. The procedure may vary among health care providers and hospitals. What happens after the procedure?  You may return to your normal, everyday life, including diet, activities, and medicines, unless your health care provider tells you not to do that.  Summary  An echocardiogram is a procedure that uses painless sound waves (ultrasound) to produce an image of the heart.  Images from an echocardiogram can provide important information about the size and shape of your heart, heart muscle function, heart valve function, and fluid buildup around your heart.  You do not need to do anything to prepare before this procedure. You may eat and drink normally.  After the echocardiogram is completed, you may return to your normal, everyday life, unless your health care provider tells you not to do that. This information is not intended to replace advice given to you by your health care provider. Make sure you discuss any questions you have with your health care provider. Document Released: 08/16/2000 Document Revised: 12/10/2018 Document Reviewed: 09/21/2016 Elsevier Patient Education  2020 [employer].

## 2019-06-15 NOTE — Telephone Encounter (Signed)
Called pt regarding her appt. Our Provider NP Frann Rider is not feeling well and out of the office. We needed to r/s her for a different date. No answer left voicemail.   Will go a head and cancel her appointment so that the will no be charged a No show fee.

## 2019-06-16 ENCOUNTER — Telehealth: Payer: Self-pay

## 2019-06-16 LAB — CBC
Hematocrit: 36.5 % (ref 34.0–46.6)
Hemoglobin: 12 g/dL (ref 11.1–15.9)
MCH: 33.1 pg — ABNORMAL HIGH (ref 26.6–33.0)
MCHC: 32.9 g/dL (ref 31.5–35.7)
MCV: 101 fL — ABNORMAL HIGH (ref 79–97)
Platelets: 133 10*3/uL — ABNORMAL LOW (ref 150–450)
RBC: 3.62 x10E6/uL — ABNORMAL LOW (ref 3.77–5.28)
RDW: 13.1 % (ref 11.7–15.4)
WBC: 3.8 10*3/uL (ref 3.4–10.8)

## 2019-06-16 LAB — BASIC METABOLIC PANEL
BUN/Creatinine Ratio: 18 (ref 12–28)
BUN: 19 mg/dL (ref 8–27)
CO2: 24 mmol/L (ref 20–29)
Calcium: 9.1 mg/dL (ref 8.7–10.3)
Chloride: 102 mmol/L (ref 96–106)
Creatinine, Ser: 1.03 mg/dL — ABNORMAL HIGH (ref 0.57–1.00)
GFR calc Af Amer: 61 mL/min/{1.73_m2} (ref >59)
GFR calc non Af Amer: 53 mL/min/{1.73_m2} — ABNORMAL LOW (ref >59)
Glucose: 109 mg/dL — ABNORMAL HIGH (ref 65–99)
Potassium: 4 mmol/L (ref 3.5–5.2)
Sodium: 136 mmol/L (ref 134–144)

## 2019-06-16 NOTE — Telephone Encounter (Signed)
Notes recorded by Frederik Schmidt, RN on 06/16/2019 at 11:11 AM EDT  The patient's grand daughter has been notified of the result and verbalized understanding. All questions (if any) were answered.  Frederik Schmidt, RN 06/16/2019 11:11 AM

## 2019-06-16 NOTE — Telephone Encounter (Signed)
Called to inform pt that our provider NP Frann Rider would be out of office tomorrow.  Was able to schedule her a little bit further out.

## 2019-06-16 NOTE — Telephone Encounter (Signed)
-----   Message from Charlie Pitter, PA-C sent at 06/16/2019 10:56 AM EDT ----- Pt speaks [place]. Translator told me yesterday that granddaughter helps with her care at home. Please let pt know labs are generally stable and consistent with prior values. Mild low platelet count, mildly elevated kidney function - but all stable. Continue plan as discussed. Dayna Dunn PA-C

## 2019-06-17 ENCOUNTER — Ambulatory Visit: Payer: Self-pay | Admitting: Adult Health

## 2019-06-17 ENCOUNTER — Telehealth: Payer: Self-pay | Admitting: Physician Assistant

## 2019-06-17 NOTE — Telephone Encounter (Signed)
See MD reply and below

## 2019-06-17 NOTE — Telephone Encounter (Signed)
   I reviewed case with Dr. Rayann Heman who agrees that we should defer any invasive evaluation given her comorbidities. We will continue to manage her with medications in the meantime.  He does feel she would best be served with a primary cardiologist since she does not have any active EP issues. She was first seen in rounds by Dr. Debara Pickett in 2019 but per discussion in the office the other day, there is preference to follow up in the [facility] office since that is where she is familiar. (Has seen EP + several APPs here before.)  Will route to Dr. Debara Pickett to find out if he is OK with patient switching to [facility] provider, and will also cc to MA to see if we can make her post-echo f/u appt with one of our general cardiologists (whoever has availability @ [facility]). Marlayna Bannister PA-C

## 2019-06-17 NOTE — Telephone Encounter (Signed)
Mikayla Reynolds - fine with me.  -Mali

## 2019-06-21 ENCOUNTER — Ambulatory Visit (INDEPENDENT_AMBULATORY_CARE_PROVIDER_SITE_OTHER): Payer: Medicaid Other | Admitting: *Deleted

## 2019-06-21 DIAGNOSIS — I5042 Chronic combined systolic (congestive) and diastolic (congestive) heart failure: Secondary | ICD-10-CM

## 2019-06-21 DIAGNOSIS — I429 Cardiomyopathy, unspecified: Secondary | ICD-10-CM

## 2019-06-22 LAB — CUP PACEART REMOTE DEVICE CHECK
Date Time Interrogation Session: 20201019203810
Implantable Pulse Generator Implant Date: 20191022

## 2019-06-25 NOTE — Telephone Encounter (Signed)
Just making sure you got this message from last week! Can you reach out to scheduling?  Since Dr. Rayann Heman would recommend the patient have a primary cardiologist, let's see if we can make her 3 month f/u appt with a [facility] cardiologist to establish care instead of with me. That way she has an attached general physician.   Byrdie Miyazaki PA-C

## 2019-06-28 NOTE — Telephone Encounter (Signed)
Called pt re: new pt appt with Dr. Marlou Porch, 09/06/2019 @ 2:00. Left a message for pt to call back.

## 2019-06-28 NOTE — Telephone Encounter (Signed)
She doesn't have a specific interventional need so does not necessarily need to see one of our cathing doctors. Anyone who has availability at that time would be fine.

## 2019-07-12 NOTE — Progress Notes (Signed)
Carelink Summary Report / Loop Recorder 

## 2019-07-25 LAB — CUP PACEART REMOTE DEVICE CHECK
Date Time Interrogation Session: 20201121153911
Implantable Pulse Generator Implant Date: 20191022

## 2019-07-26 ENCOUNTER — Ambulatory Visit (INDEPENDENT_AMBULATORY_CARE_PROVIDER_SITE_OTHER): Payer: Medicaid Other | Admitting: *Deleted

## 2019-07-26 DIAGNOSIS — I639 Cerebral infarction, unspecified: Secondary | ICD-10-CM | POA: Diagnosis not present

## 2019-08-26 ENCOUNTER — Ambulatory Visit (INDEPENDENT_AMBULATORY_CARE_PROVIDER_SITE_OTHER): Payer: Medicaid Other | Admitting: *Deleted

## 2019-08-26 DIAGNOSIS — I639 Cerebral infarction, unspecified: Secondary | ICD-10-CM

## 2019-08-28 LAB — CUP PACEART REMOTE DEVICE CHECK
Date Time Interrogation Session: 20201224153905
Implantable Pulse Generator Implant Date: 20191022

## 2019-08-30 ENCOUNTER — Other Ambulatory Visit: Payer: Self-pay

## 2019-08-30 ENCOUNTER — Ambulatory Visit (HOSPITAL_COMMUNITY): Payer: Medicaid Other | Attending: Cardiology

## 2019-08-30 DIAGNOSIS — I5042 Chronic combined systolic (congestive) and diastolic (congestive) heart failure: Secondary | ICD-10-CM | POA: Diagnosis not present

## 2019-09-06 ENCOUNTER — Ambulatory Visit: Payer: Medicaid Other | Admitting: Cardiology

## 2019-09-23 ENCOUNTER — Encounter: Payer: Self-pay | Admitting: Adult Health

## 2019-09-23 ENCOUNTER — Ambulatory Visit: Payer: Medicaid Other | Admitting: Adult Health

## 2019-09-23 VITALS — BP 126/80 | HR 58 | Temp 97.4°F | Ht 63.0 in | Wt 123.0 lb

## 2019-09-23 DIAGNOSIS — E785 Hyperlipidemia, unspecified: Secondary | ICD-10-CM

## 2019-09-23 DIAGNOSIS — I639 Cerebral infarction, unspecified: Secondary | ICD-10-CM | POA: Diagnosis not present

## 2019-09-23 DIAGNOSIS — R5381 Other malaise: Secondary | ICD-10-CM

## 2019-09-23 DIAGNOSIS — I10 Essential (primary) hypertension: Secondary | ICD-10-CM

## 2019-09-23 NOTE — Progress Notes (Signed)
I agree with the above plan 

## 2019-09-23 NOTE — Progress Notes (Signed)
[facility] [address] [place]. [place] [address] [phone]       OFFICE FOLLOW UP VISIT NOTE  Mikayla. Mikayla Reynolds Date of Birth:  1940-12-19 Medical Record Number:  [id number]   Referring MD:  Merlene Laughter  Reason for Referral:  Stroke  Chief Complaint  Patient presents with  . Follow-up    TxRm. with interpreter. Left side is weak and numb. Has had the weakness for a long time no longer to carry things on with her left hand. BL hand weakness.      HPI: Initial visit 03/17/18 Dr. Leonie Man : Mikayla Reynolds is a pleasant 79 year old [place] lady who seen today for first office follow-up visit for hospital admission for stroke in April 2019. She is a accompanied by her granddaughter who translates for her as patient speaks no [place]. History is obtained from them as well as review of electronic medical records. I have personally reviewed imaging for lemons in hospital stroke workup.HPI:( Dr Lorraine Lax)  Mikayla Reynolds is an 79 y.o. [place] female with a past medical history of 2 previous TIAs, hypertension who presents to the ED with a 2-day complaint of left sided hemiparesis, left paresthesia, dysphagia, dysarthria left facial droop and impaired mobility.  Patient is [place] speaking and she refused for iPad [place] interpreter, and opted for granddaughter at the bedside to interpret for her.Per family, patient's son last saw her at 1 PM on Wednesday, 12/25/2017, he went out and returned around 8 PM.  At that time patient was confused, was speaking gibberish, with slurred speech, left-sided facial droop, unsteady gait but was still walking around as well as left sided weakness.  At baseline patient is left-handed, when she was eating dinner she was unable to hold the spoon in her left hand and had drooling of the fluid out of the left side of her mouth with some difficulty swallowing.  Patient refused to come to the ER per family but they just had to bring her in today due to the  persistency of her symptoms. Per patient through interpretation by her granddaughter, she had a headache starting late Thursday, which continues to be persistent up until now, difficulty sleeping, blurred vision which has since resolved, denies trauma or falls. Per family patient has had 2 previous TIAs, first 1 in [place] 2 years ago with the exact same symptoms which resolved spontaneously, second one was about 9 months ago with the exact same symptoms, patient refused to go to the hospital at that time and by the time she had woken up the next morning symptoms had resolved. On admission to the ER patient is very compliant, does answer questions appropriately, per family continues to have slurred speech, but more lucid with more coherent speech.  Initial CT of the head done in the ER showed acute right MCA distribution infarct.  Neuro was consulted for further evaluation.  Patient denies dizziness, nausea, vomiting she admits to intermittent chest pain and family says has some anxiety.  Troponin on admission was elevated at 0.1, EKG was normal sinus rhythm no ST-t wave changes consistent with ischemia, chest x-ray pending, normal electrolytes within normal limits. 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 MRI scan confirmed a right frontal MCA branch infarct. Carotid ultrasound was not done. LDL cholesterol was elevated 120 mg percent. In women A1c was 6. Transthoracic echo showed normal ejection  fraction. Telemetric cardiac monitoring did not reveal any paroxysmal A. fib. Patient was started on aspirin for stroke prevention. Physical occupational therapy were consulted. Patient did well and was discharged home. She has finished home therapies. Her speech and swallowing have improved. She still has some diminished fine motor skills in the left hand. She still drops objects from  hand. She is tolerating aspirin well without bleeding or bruising. She is also on Plavix. Transesophageal echocardiogram and prolonged cardiac monitoring for some reason the not ordered. Patient has no new complaints.  Update 06/22/18 Dr. Leonie Man : She returns for follow-up after last visit 3 months ago.  She is accompanied by granddaughter and [place] language interpreter who was present throughout this visit.  Patient continues to do well.  She has had no recurrent stroke or TIA symptoms.  She still complains of left hand weakness and numbness and diminished fine motor skills.  She does exercise a left hand regularly.  She is tolerating aspirin well without bruising or bleeding.  She remains on Lipitor and is tolerating it well.  She has no new complaints.  The patient was scheduled to undergo TEE and loop recorder but that has not yet happened and is scheduled for later this week.  Update 09/23/2019: Mikayla. Mikayla Reynolds is a 79 year old female who is being seen today for stroke follow-up accompianed by interpreter.  She has not been seen in over the past year due to rescheduling of visits.  Residual stroke deficits left hand weakness, decreased fine motor skills and numbness.  She had for stated that she has experienced worsening left hand weakness which has been slowly worsening over the past 3 to 4 months as well as weakness in legs bilaterally.  She feels as though her functional ability has been worsening each day and has greater difficulty with ambulation.  She denies pain such as arthritis but does state mobility improves after ambulating for longer duration.  She does use cane for assistance due to fear of falling but denies any recent falls.  She continues to live with her son and daughter in law but is able to maintain ADLs without difficulty.  She does endorse sedentary lifestyle with little exercise or activity as well as having been seen by multiple providers due to health conditions.  She also endorses  lack of nutrition due to poor appetite and typically will only eat 2 meals per day or will replace a meal with Ensure.  She also endorses weight loss and feels as though she is more frail.  Denies any memory or cognition concerns besides normal age-related short-term memory loss, any additional weakness, dizziness, imbalance or visual changes.  After further discussion, it was determined as though left hand has not actually worsened since her stroke but has not improved.  Continues on aspirin and atorvastatin for secondary stroke prevention without side effects.  Blood pressure today stable at 126/80.  She did undergo TEE with loop recorder placement on 08/30/2018.  TEE showed EF of 50 to 55% without evidence of cardiac source of embolus or PFO.  Loop recorder has not shown atrial fibrillation thus far.  She continues to follow with cardiology regularly for ongoing monitoring and management of chronic combined CHF.  No further concerns at this time.    ROS:   14 system review of systems is positive for mild short-term memory loss, hand weakness, numbness, generalized weakness, decreased appetite and gait impairment and all systems negative  PMH:  Past Medical History:  Diagnosis Date  .  Abnormal liver function   . Anemia, unspecified   . Chronic combined systolic and diastolic CHF (congestive heart failure) ([place])   . CKD (chronic kidney disease), stage III 02/19/2018  . Elevated troponin 12/27/2017  . Hyperlipidemia   . Junctional bradycardia   . Mitral regurgitation   . Pre-diabetes   . Renal mass   . Stroke [facility])     Social History:  Social History   Socioeconomic History  . Marital status: Widowed    Spouse name: Not on file  . Number of children: Not on file  . Years of education: Not on file  . Highest education level: Not on file  Occupational History  . Not on file  Tobacco Use  . Smoking status: Never Smoker  . Smokeless tobacco: Never Used  Substance and Sexual Activity    . Alcohol use: Not Currently  . Drug use: Not Currently  . Sexual activity: Not Currently  Other Topics Concern  . Not on file  Social History Narrative  . Not on file   Social Determinants of Health   Financial Resource Strain:   . Difficulty of Paying Living Expenses: Not on file  Food Insecurity:   . Worried About Charity fundraiser in the Last Year: Not on file  . Ran Out of Food in the Last Year: Not on file  Transportation Needs:   . Lack of Transportation (Medical): Not on file  . Lack of Transportation (Non-Medical): Not on file  Physical Activity:   . Days of Exercise per Week: Not on file  . Minutes of Exercise per Session: Not on file  Stress:   . Feeling of Stress : Not on file  Social Connections:   . Frequency of Communication with Friends and Family: Not on file  . Frequency of Social Gatherings with Friends and Family: Not on file  . Attends Religious Services: Not on file  . Active Member of Clubs or Organizations: Not on file  . Attends Archivist Meetings: Not on file  . Marital Status: Not on file  Intimate Partner Violence:   . Fear of Current or Ex-Partner: Not on file  . Emotionally Abused: Not on file  . Physically Abused: Not on file  . Sexually Abused: Not on file    Medications:   Current Outpatient Medications on File Prior to Visit  Medication Sig Dispense Refill  . aspirin EC 81 MG tablet Take 1 tablet (81 mg total) by mouth daily. 100 tablet 1  . atorvastatin (LIPITOR) 40 MG tablet Take 1 tablet (40 mg total) by mouth daily at 6 PM. 90 tablet 3  . furosemide (LASIX) 20 MG tablet Take 1 tablet (20 mg total) by mouth every other day. 45 tablet 3  . lisinopril (ZESTRIL) 5 MG tablet Take 1 tablet (5 mg total) by mouth daily. 90 tablet 3  . spironolactone (ALDACTONE) 25 MG tablet Take 0.5 tablets (12.5 mg total) by mouth daily. 90 tablet 3   No current facility-administered medications on file prior to visit.    Allergies:  No  Known Allergies  Physical Exam  Today's Vitals   09/23/19 1050  BP: 126/80  Pulse: (!) 58  Temp: (!) 97.4 F (36.3 C)  Weight: 123 lb (55.8 kg)  Height: 5\' 3"  (1.6 m)   Body mass index is 21.79 kg/m.  General: Frail pleasant elderly Asian lady seated, in no evident distress Head: head normocephalic and atraumatic.   Neck: supple with no carotid or  supraclavicular bruits Cardiovascular: regular rate and rhythm, no murmurs Musculoskeletal: no deformity Skin:  no rash/petichiae Vascular:  Normal pulses all extremities  Neurologic Exam Mental Status: Awake and fully alert.  Difficulty assessing speech as primary language [place].  Oriented to place and time. Recent subjectively mildly impaired and remote memory intact. Attention span, concentration and fund of knowledge appropriate during visit. Mood and affect appropriate.  Cranial Nerves: Fundoscopic exam reveals sharp disc margins. Pupils equal, briskly reactive to light. Extraocular movements full without nystagmus. Visual fields full to confrontation. Hearing intact. Facial sensation intact. Mild left lower facial asymmetry., tongue, palate moves normally and symmetrically.  Motor: Normal bulk and tone. Normal strength in all tested extremity muscles except mild bilateral hip flexor weakness and Mild weakness of left grip and intrinsic hand muscles. Left index has trigger finger.  Sensory.: intact to touch , pinprick , position and vibratory sensation.  Coordination: Rapid alternating movements normal in all extremities except decreased left hand. Finger-to-nose and heel-to-shin performed accurately bilaterally. Gait and Station: Arises from chair without difficulty. Stance is slightly hunched. Gait demonstrates initially short shuffled steps with use of cane but after walking approximately 10 feet he improved to normal stride length and minimal use of cane.   Reflexes: 1+ and symmetric. Toes downgoing.       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 Vascular risk factors of hyperlipidemia and age only.  She did undergo TEE with loop recorder placement in 06/2018 which has not shown atrial fibrillation thus far.  She comes in today after not being seen over the past year with complaints of gradually worsening lower extremity weakness over the past 3 to 4 months causing gait impairment    PLAN: -Mild lower extremity hip flexor weakness likely secondary to deconditioning from lack of physical activity or exercise, poor nutrition, weight loss and ongoing medical conditions.  Question possible arthritis due to what appears to be possible stiffness after sitting for longer duration or upon awakening in the morning but improves after activity.  Discussion regarding possible benefit with physical therapy but she would like to discuss this further with her granddaughter and will call office back or speak to PCP if interested in pursuing.  Advised to use her cane at all times and ongoing fall precautions.  Discussion regarding importance of adequate nutrition and regular exercise to prevent any worsening.  No indication at this time to obtain any type of imaging.  Did advise to call 911 immediately with any acute onset of weakness, numbness, imbalance, visual changes or confusion -Continue aspirin 81 mg daily and atorvastatin 40 mg daily for secondary stroke prevention  -Loop recorder will continue to be monitored for possible atrial fibrillation -Continue to follow with cardiology as recommended -Continue to follow with PCP for HLD and HTN management -maintain strict control of hypertension with blood pressure goal below 130/90, diabetes with hemoglobin A1c goal below 6.5% and lipids with LDL cholesterol goal below 70  mg/dL. I also advised the patient to eat a healthy diet with plenty of whole grains, cereals, fruits and vegetables, exercise regularly and maintain ideal body weight .    Follow-up in 6 months or call earlier if needed   Greater than 50% time during this 30 minute visit was spent on discussing new concerns as noted above, discussion regarding deconditioning and future treatment options,  discussion regarding importance of secondary stroke prevention and answered all questions to patient satisfaction   Frann Rider, [facility]  [facility] [address] [place], [place] [address]  Phone [phone] Fax [phone] Note: This document was prepared with digital dictation and possible smart phrase technology. Any transcriptional errors that result from this process are unintentional.

## 2019-09-23 NOTE — Patient Instructions (Signed)
Highly recommend participating in physical therapy as it appears your symptoms are greater to deconditioning over time with lack of exercise and poor nutrition. If you are interested in pursuing therapy, please call office or speak to PCP for referral  Your loop recorder will continued to be monitored   Continue aspirin 81 mg daily  and atorvastatin 40mg   for secondary stroke prevention  Continue to follow up with PCP regarding cholesterol and blood pressure management   Continue to follow up with cardiology as scheduled  Continue to monitor blood pressure at home  Maintain strict control of hypertension with blood pressure goal below 130/90, diabetes with hemoglobin A1c goal below 6.5% and cholesterol with LDL cholesterol (bad cholesterol) goal below 70 mg/dL. I also advised the patient to eat a healthy diet with plenty of whole grains, cereals, fruits and vegetables, exercise regularly and maintain ideal body weight.  Followup in the future with me in 6 months or call earlier if needed       Thank you for coming to see [place] at [facility]. I hope we have been able to provide you high quality care today.  You may receive a patient satisfaction survey over the next few weeks. We would appreciate your feedback and comments so that we may continue to improve ourselves and the health of our patients.     Deconditioning Deconditioning refers to the changes in the body that occur during a period of inactivity. The changes happen in the heart, lungs, and muscles. They make you feel tired and weak (fatigued) and decrease your ability to be active. The three stages of deconditioning include:  Mild deconditioning. This is a change in your ability to do your usual exercise activities, such as running, biking, or swimming.  Moderate deconditioning. This is a change in your ability to do normal everyday activities, such as walking, shopping for groceries, and doing  chores.  Severe deconditioning. In this stage, you may not be able to do minimal activity or usual self-care. What are the causes? Deconditioning can occur after only a few days of inactivity. The longer the period of inactivity, the more severe the deconditioning will be, and the longer it will take to return to your previous level of functioning. Deconditioning is often caused by inactivity due to:  Illnesses, such as cancer, stroke, heart attack, fibromyalgia, and chronic fatigue syndrome.  Injuries, especially back injuries, broken bones, and injuries to soft tissues, such as ligaments and tendons.  A long stay in the hospital.  Pregnancy, especially if long periods of bed rest are needed. What increases the risk? The following factors may make you more likely to develop this condition:  Staying in the hospital or being on bed rest.  Obesity.  Poor nutrition.  Being an older adult.  Having an injury or illness that affects your movement and activity. What are the signs or symptoms? Symptoms of this condition include:  Weakness and tiredness.  Shortness of breath with minor physical effort (exertion).  A heartbeat that is faster than normal. You may not notice this without taking your pulse.  Pain or discomfort with activity.  Decreased strength, endurance, and balance.  Difficulty doing your usual forms of exercise.  Difficulty doing activities of daily living, such as grocery shopping or chores. You may also have problems walking around the house and doing basic self-care, such as getting to the bathroom, preparing meals, or doing laundry. How is this diagnosed? This condition is diagnosed based on your  medical history and a physical exam. During the physical exam, your health care provider will check for signs of deconditioning, such as:  Decreased size of muscles.  Decreased strength.  Trouble with balance.  Shortness of breath or a heart rate that is faster  than normal after minor exertion. How is this treated? Treatment for this condition involves an exercise program in which activity is increased slowly. Your health care provider will tell you which exercises are right for you. The exercise program will likely include:  Aerobic exercise. This type of exercise helps improve the functioning of the heart, lungs, and muscles.  Strength training. This type of exercise helps increase muscle size and strength. Both of these types of exercise will improve your endurance. You may be referred to a physical therapist who can create a safe strengthening program for you to follow. Follow these instructions at home: Eating and drinking   Eat a healthy, well-balanced diet. This includes: ? Proteins, such as lean meats and fish, to build muscles. ? Fresh fruits and vegetables. ? Carbohydrates, such as whole grains, to boost energy.  Drink enough fluid to keep your urine pale yellow. Activity   Follow the exercise program that is recommended by your health care provider or physical therapist.  Do not increase your exercise any faster than directed. General instructions  Take over-the-counter and prescription medicines only as told by your health care provider.  Do not use any products that contain nicotine or tobacco, such as cigarettes, e-cigarettes, and chewing tobacco. If you need help quitting, ask your health care provider.  Keep all follow-up visits as told by your health care provider. This is important. Contact a health care provider if:  You are not able to do the recommended exercise program.  You are becoming more and more tired and weak.  You become light-headed when rising to a sitting or standing position.  Your level of endurance decreases after it has improved. Get help right away if you:  Have chest pain.  Are very short of breath.  Have any episodes of fainting. Summary  Deconditioning refers to the changes in the  body that occur during a period of inactivity.  Deconditioning happens in the heart, lungs, and muscles. The changes make you feel tired and weak and decrease your ability to be active.  Treatment for deconditioning involves an exercise program in which activity is increased slowly. This information is not intended to replace advice given to you by your health care provider. Make sure you discuss any questions you have with your health care provider. Document Revised: 01/14/2019 Document Reviewed: 01/14/2019 Elsevier Patient Education  [place].

## 2019-09-27 ENCOUNTER — Ambulatory Visit (INDEPENDENT_AMBULATORY_CARE_PROVIDER_SITE_OTHER): Payer: Medicaid Other | Admitting: *Deleted

## 2019-09-27 DIAGNOSIS — I639 Cerebral infarction, unspecified: Secondary | ICD-10-CM

## 2019-09-27 LAB — CUP PACEART REMOTE DEVICE CHECK
Date Time Interrogation Session: 20210124232800
Implantable Pulse Generator Implant Date: 20191022

## 2019-10-13 DIAGNOSIS — D49511 Neoplasm of unspecified behavior of right kidney: Secondary | ICD-10-CM | POA: Diagnosis not present

## 2019-10-18 DIAGNOSIS — N281 Cyst of kidney, acquired: Secondary | ICD-10-CM | POA: Diagnosis not present

## 2019-10-19 DIAGNOSIS — N2889 Other specified disorders of kidney and ureter: Secondary | ICD-10-CM | POA: Diagnosis not present

## 2019-10-25 DIAGNOSIS — N281 Cyst of kidney, acquired: Secondary | ICD-10-CM | POA: Diagnosis not present

## 2019-10-28 ENCOUNTER — Ambulatory Visit (INDEPENDENT_AMBULATORY_CARE_PROVIDER_SITE_OTHER): Payer: Medicaid Other | Admitting: *Deleted

## 2019-10-28 DIAGNOSIS — I639 Cerebral infarction, unspecified: Secondary | ICD-10-CM | POA: Diagnosis not present

## 2019-10-28 LAB — CUP PACEART REMOTE DEVICE CHECK
Date Time Interrogation Session: 20210225000240
Implantable Pulse Generator Implant Date: 20191022

## 2019-10-28 NOTE — Progress Notes (Signed)
ILR Remote 

## 2019-11-03 ENCOUNTER — Encounter: Payer: Self-pay | Admitting: Internal Medicine

## 2019-11-03 NOTE — Progress Notes (Signed)
I received note from [facility].  Patient was seen 10/25/2019.  She had surveillance CAT scan of the abdomen which revealed significant decrease in the size of complex cystic lesion either within or intimately associated with the right kidney.  Interval decrease in size suggests a cyst or sinus cyst complicated by infection.  Incidental finding is coronary artery atherosclerosis and aortic atherosclerosis, pancreatic divisum.  Plan is for surveillance on renal ultrasound in 1 year.

## 2019-11-17 ENCOUNTER — Ambulatory Visit: Payer: Medicaid Other | Admitting: Cardiology

## 2019-11-17 ENCOUNTER — Other Ambulatory Visit: Payer: Self-pay

## 2019-11-17 ENCOUNTER — Encounter: Payer: Self-pay | Admitting: Cardiology

## 2019-11-17 VITALS — BP 116/80 | HR 77 | Ht 63.0 in | Wt 124.8 lb

## 2019-11-17 DIAGNOSIS — I639 Cerebral infarction, unspecified: Secondary | ICD-10-CM

## 2019-11-17 DIAGNOSIS — I429 Cardiomyopathy, unspecified: Secondary | ICD-10-CM | POA: Diagnosis not present

## 2019-11-17 DIAGNOSIS — I5042 Chronic combined systolic (congestive) and diastolic (congestive) heart failure: Secondary | ICD-10-CM

## 2019-11-17 MED ORDER — ATORVASTATIN CALCIUM 40 MG PO TABS: 40.0000 mg | ORAL_TABLET | Freq: Every day | ORAL | 3 refills | Status: AC

## 2019-11-17 MED ORDER — LISINOPRIL 5 MG PO TABS: 5.0000 mg | ORAL_TABLET | Freq: Every day | ORAL | 3 refills | Status: DC

## 2019-11-17 MED ORDER — ASPIRIN EC 81 MG PO TBEC: 81.0000 mg | DELAYED_RELEASE_TABLET | Freq: Every day | ORAL | 3 refills | Status: DC

## 2019-11-17 MED ORDER — FUROSEMIDE 20 MG PO TABS: 20.0000 mg | ORAL_TABLET | ORAL | 3 refills | Status: DC | PRN

## 2019-11-17 MED ORDER — SPIRONOLACTONE 25 MG PO TABS: 12.5000 mg | ORAL_TABLET | Freq: Every day | ORAL | 3 refills | Status: DC

## 2019-11-17 NOTE — Progress Notes (Signed)
Cardiology Office Note:    Date:  11/17/2019   ID:  Mikayla Reynolds, DOB [birth year], MRN [id number]  PCP:  Ladell Pier, MD  Cardiologist:  Candee Furbish, MD  Electrophysiologist:  None   Referring MD: Ladell Pier, MD     History of Present Illness:    Mikayla Reynolds is a 79 y.o. female here for establishment of care visit, used to see Dr. Debara Pickett, previously seen Dr. Rayann Heman.  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  Here for follow-up of heart failure.  Loop recorder in 2019 inserted after stroke.  EF at that time was good low normal 50 to 55% by TEE.  However, in August 2020 EF was reduced at 25%.  Her echocardiogram on 08/30/2019 was mildly improved:  1. Left ventricular ejection fraction, by visual estimation, is 25 to  30%. The left ventricle has severely decreased function. There is severely  increased left ventricular hypertrophy.  2. Elevated left atrial and left ventricular end-diastolic pressures.  3. Left ventricular diastolic parameters are consistent with Grade III  diastolic dysfunction (restrictive).  4. The left ventricle demonstrates global hypokinesis.  5. Global right ventricle has severely reduced systolic function.The  right ventricular size is moderately enlarged. No increase in right  ventricular wall thickness.  6. Left atrial size was mildly dilated.  7. Right atrial size was normal.  8. The mitral valve is normal in structure. Moderate mitral valve  regurgitation. No evidence of mitral stenosis.  9. The tricuspid valve is normal in structure.  10. The aortic valve is normal in structure. Aortic valve regurgitation is  not visualized. No evidence of aortic valve sclerosis or stenosis.  11. Pulmonic regurgitation is mild.  12. The pulmonic valve was normal in structure. Pulmonic valve  regurgitation is mild.  13.  Moderately elevated pulmonary artery systolic pressure.  14. The tricuspid regurgitant velocity is 3.22 m/s, and with an assumed  right atrial pressure of 15 mmHg, the estimated right ventricular systolic  pressure is moderately elevated at 56.5 mmHg.  15. The inferior vena cava is normal in size with greater than 50%  respiratory variability, suggesting right atrial pressure of 3 mmHg.  16. There is severe biventricular systolic dysfunction, valves thickening  and severe concentric LVH. Cardiac amyloidosis should be considered. A PYP  nuclear test for evaluation of TTR amyloidosis is recommended.  17. The average left ventricular global longitudinal strain is -10.4 %.    Daytime ok . Night time breath harder, sometimes.   Past Medical History:  Diagnosis Date  . Abnormal liver function   . Anemia, unspecified   . Chronic combined systolic and diastolic CHF (congestive heart failure) ([place])   . CKD (chronic kidney disease), stage III 02/19/2018  . Elevated troponin 12/27/2017  . Hyperlipidemia   . Junctional bradycardia   . Mitral regurgitation   . Pre-diabetes   . Renal mass   . Stroke [facility])     Past Surgical History:  Procedure Laterality Date  . abdominal wall surgery  2017  . APPENDECTOMY    . LOOP RECORDER INSERTION N/A 06/23/2018   Procedure: LOOP RECORDER INSERTION;  Surgeon: [name], MD;  Location: [facility];  Service: Cardiovascular;  Laterality: N/A;  . TEE WITHOUT CARDIOVERSION N/A 06/23/2018   Procedure: TRANSESOPHAGEAL ECHOCARDIOGRAM (TEE);  Surgeon: [name], MD;  Location: [facility] ENDOSCOPY;  Service: Cardiovascular;  Laterality: N/A;    Current  Medications: Current Meds  Medication Sig  . aspirin EC 81 MG tablet Take 1 tablet (81 mg total) by mouth daily.  Marland Kitchen atorvastatin (LIPITOR) 40 MG tablet Take 1 tablet (40 mg total) by mouth daily at 6 PM.  . lisinopril (ZESTRIL) 5 MG tablet Take 1 tablet (5 mg total) by mouth daily.  . Methylcellulose,  Laxative, (CITRUCEL PO) Take by mouth.  . Polyethylene Glycol 3350 (MIRALAX PO) Take by mouth.  . spironolactone (ALDACTONE) 25 MG tablet Take 0.5 tablets (12.5 mg total) by mouth daily.  . [DISCONTINUED] aspirin EC 81 MG tablet Take 1 tablet (81 mg total) by mouth daily.  . [DISCONTINUED] atorvastatin (LIPITOR) 40 MG tablet Take 1 tablet (40 mg total) by mouth daily at 6 PM.  . [DISCONTINUED] lisinopril (ZESTRIL) 5 MG tablet Take 1 tablet (5 mg total) by mouth daily.  . [DISCONTINUED] spironolactone (ALDACTONE) 25 MG tablet Take 0.5 tablets (12.5 mg total) by mouth daily.     Allergies:   Patient has no known allergies.   Social History   Socioeconomic History  . Marital status: Widowed    Spouse name: Not on file  . Number of children: Not on file  . Years of education: Not on file  . Highest education level: Not on file  Occupational History  . Not on file  Tobacco Use  . Smoking status: Never Smoker  . Smokeless tobacco: Never Used  Substance and Sexual Activity  . Alcohol use: Not Currently  . Drug use: Not Currently  . Sexual activity: Not Currently  Other Topics Concern  . Not on file  Social History Narrative  . Not on file   Social Determinants of Health   Financial Resource Strain:   . Difficulty of Paying Living Expenses:   Food Insecurity:   . Worried About Charity fundraiser in the Last Year:   . Arboriculturist in the Last Year:   Transportation Needs:   . Film/video editor (Medical):   Marland Kitchen Lack of Transportation (Non-Medical):   Physical Activity:   . Days of Exercise per Week:   . Minutes of Exercise per Session:   Stress:   . Feeling of Stress :   Social Connections:   . Frequency of Communication with Friends and Family:   . Frequency of Social Gatherings with Friends and Family:   . Attends Religious Services:   . Active Member of Clubs or Organizations:   . Attends Archivist Meetings:   Marland Kitchen Marital Status:      Family  History: The patient's family history includes Healthy in her brother and sister.  ROS:   Please see the history of present illness.     All other systems reviewed and are negative.  EKGs/Labs/Other Studies Reviewed:    The following studies were reviewed today: Hospital records echocardiogram personally reviewed images  EKG:  EKG is not ordered today.   Recent Labs: 03/11/2019: TSH 2.420 04/02/2019: BNP 591.2 05/24/2019: ALT 21 06/15/2019: BUN 19; Creatinine, Ser 1.03; Hemoglobin 12.0; Platelets 133; Potassium 4.0; Sodium 136  Recent Lipid Panel    Component Value Date/Time   CHOL 147 05/24/2019 1628   TRIG 63 05/24/2019 1628   HDL 61 05/24/2019 1628   CHOLHDL 2.4 05/24/2019 1628   CHOLHDL 4.9 12/28/2017 0529   VLDL 15 12/28/2017 0529   LDLCALC 73 05/24/2019 1628    Physical Exam:    VS:  BP 116/80   Pulse 77   Ht 5'  3" (1.6 m)   Wt 124 lb 12.8 oz (56.6 kg)   SpO2 99%   BMI 22.11 kg/m     Wt Readings from Last 3 Encounters:  11/17/19 124 lb 12.8 oz (56.6 kg)  09/23/19 123 lb (55.8 kg)  06/15/19 114 lb 12.8 oz (52.1 kg)     GEN: Thin Well nourished, well developed in no acute distress HEENT: Normal NECK: No JVD; No carotid bruits LYMPHATICS: No lymphadenopathy CARDIAC: RRR, no murmurs, rubs, gallops RESPIRATORY:  Clear to auscultation without rales, wheezing or rhonchi  ABDOMEN: Soft, non-tender, non-distended MUSCULOSKELETAL:  No edema; No deformity  SKIN: Warm and dry NEUROLOGIC:  Alert and oriented x 3 PSYCHIATRIC:  Normal affect   ASSESSMENT:    1. Chronic combined systolic and diastolic CHF (congestive heart failure) ([place])   2. Cerebrovascular accident (CVA), unspecified mechanism ([place])   3. Cardiomyopathy, unspecified type ([place])    PLAN:    In order of problems listed above:  Chronic systolic heart failure -Overall well compensated.  She may take her Lasix 20 mg as needed.  She does not need it currently.  Sometimes she will feel more shortness  of breath at night.  If she does feel that, she may take her Lasix the next day. -Avoid salt excessive fluids. -Possibly consider PYP scan at a later date. -Continue with lisinopril.  Aldactone.  Mitral regurgitation -Seems stable.  I do not hear a murmur on exam either.  Stroke -Aspirin -Implantable loop recorder.  No atrial fibrillation at this time.  We will have her follow-up in 3 months with Hinton Dyer, 6 months with me  Appreciate translator.   Medication Adjustments/Labs and Tests Ordered: Current medicines are reviewed at length with the patient today.  Concerns regarding medicines are outlined above.  No orders of the defined types were placed in this encounter.  Meds ordered this encounter  Medications  . aspirin EC 81 MG tablet    Sig: Take 1 tablet (81 mg total) by mouth daily.    Dispense:  90 tablet    Refill:  3  . atorvastatin (LIPITOR) 40 MG tablet    Sig: Take 1 tablet (40 mg total) by mouth daily at 6 PM.    Dispense:  90 tablet    Refill:  3  . furosemide (LASIX) 20 MG tablet    Sig: Take 1 tablet (20 mg total) by mouth as needed. Take one tablet (20 mg) as needed for weight gain of >3lbs overnight, increased shortness of breath, or swelling    Dispense:  45 tablet    Refill:  3  . lisinopril (ZESTRIL) 5 MG tablet    Sig: Take 1 tablet (5 mg total) by mouth daily.    Dispense:  90 tablet    Refill:  3  . spironolactone (ALDACTONE) 25 MG tablet    Sig: Take 0.5 tablets (12.5 mg total) by mouth daily.    Dispense:  45 tablet    Refill:  3    Patient Instructions  Medication Instructions:  Your physician recommends that you continue on your current medications as directed. Please refer to the Current Medication list given to you today.  *If you need a refill on your cardiac medications before your next appointment, please call your pharmacy*   Follow-Up: At [facility], you and your health needs are our priority.  As part of our continuing mission to  provide you with exceptional heart care, we have created designated Provider Care Teams.  These  Care Teams include your primary Cardiologist (physician) and Advanced Practice Providers (APPs -  Physician Assistants and Nurse Practitioners) who all work together to provide you with the care you need, when you need it.   Your next appointment:   3 month(s)  The format for your next appointment:   In Person  Provider:   Melina Copa, PA-C   Follow up with Dr. Marlou Porch in 6 months.  You will receive a reminder letter in the mail two months in advance. If you don't receive a letter, please call our office to schedule the follow-up appointment.     Signed, Candee Furbish, MD  11/17/2019 2:47 PM    [place]

## 2019-11-17 NOTE — Patient Instructions (Addendum)
Medication Instructions:  Your physician recommends that you continue on your current medications as directed. Please refer to the Current Medication list given to you today.  *If you need a refill on your cardiac medications before your next appointment, please call your pharmacy*   Follow-Up: At [facility], you and your health needs are our priority.  As part of our continuing mission to provide you with exceptional heart care, we have created designated Provider Care Teams.  These Care Teams include your primary Cardiologist (physician) and Advanced Practice Providers (APPs -  Physician Assistants and Nurse Practitioners) who all work together to provide you with the care you need, when you need it.   Your next appointment:   3 month(s)  The format for your next appointment:   In Person  Provider:   Melina Copa, PA-C   Follow up with Dr. Marlou Porch in 6 months.  You will receive a reminder letter in the mail two months in advance. If you don't receive a letter, please call our office to schedule the follow-up appointment.

## 2019-11-29 ENCOUNTER — Ambulatory Visit (INDEPENDENT_AMBULATORY_CARE_PROVIDER_SITE_OTHER): Payer: Medicaid Other | Admitting: *Deleted

## 2019-11-29 DIAGNOSIS — I639 Cerebral infarction, unspecified: Secondary | ICD-10-CM

## 2019-11-29 LAB — CUP PACEART REMOTE DEVICE CHECK
Date Time Interrogation Session: 20210328023139
Implantable Pulse Generator Implant Date: 20191022

## 2019-11-30 NOTE — Progress Notes (Signed)
ILR Remote 

## 2019-12-30 LAB — CUP PACEART REMOTE DEVICE CHECK
Date Time Interrogation Session: 20210428233230
Implantable Pulse Generator Implant Date: 20191022

## 2020-01-03 ENCOUNTER — Ambulatory Visit (INDEPENDENT_AMBULATORY_CARE_PROVIDER_SITE_OTHER): Payer: Medicaid Other | Admitting: *Deleted

## 2020-01-03 DIAGNOSIS — I639 Cerebral infarction, unspecified: Secondary | ICD-10-CM | POA: Diagnosis not present

## 2020-01-04 NOTE — Progress Notes (Signed)
Carelink Summary Report / Loop Recorder 

## 2020-02-01 ENCOUNTER — Ambulatory Visit (INDEPENDENT_AMBULATORY_CARE_PROVIDER_SITE_OTHER): Payer: Medicaid Other | Admitting: *Deleted

## 2020-02-01 DIAGNOSIS — I429 Cardiomyopathy, unspecified: Secondary | ICD-10-CM

## 2020-02-01 LAB — CUP PACEART REMOTE DEVICE CHECK
Date Time Interrogation Session: 20210529233446
Implantable Pulse Generator Implant Date: 20191022

## 2020-02-02 NOTE — Progress Notes (Signed)
Carelink Summary Report / Loop Recorder 

## 2020-02-07 DIAGNOSIS — I429 Cardiomyopathy, unspecified: Secondary | ICD-10-CM

## 2020-02-20 NOTE — Progress Notes (Addendum)
Cardiology Office Note    Date:  02/21/2020   ID:  Mikayla Reynolds, DOB 1941/03/14, MRN [id number]  PCP:  Mikayla Pier, MD  Cardiologist:  Mikayla Furbish, MD  Electrophysiologist:  Mikayla Grayer, MD   Chief Complaint: f/u CHF  History of Present Illness:   Mikayla Reynolds is a 79 y.o. female with history of CKD stage III, prediabetes, hyperlipidemia, cryptogenic stroke s/p ILR, renal mass, chronic combined CHF, moderate mitral regurgitation, and question of junctional bradycardia who presents for f/u.  She was initially seen in 12/2017 in consultation for elevated troponin in context of stroke. It was doubted that this was related to ACS; recommendation was to consider OP stress test. She initially did not have any general cardiology follow-up thereafter. She had loop recorder inserted in 06/2018 as part of work-up for stroke. At that time, EF was 50-55% by TEE. She subsequently followed with EP. In the summer of 2020, she developed issues with poor appetite and weight loss as well as progressive abdominal fullness and anemia. Her PCP obtained an echocardiogram 04/06/2019 which showed newly decreased LV systolic function, EF 70-62%, moderate asymmetric LV hypertrophy, diastolic dysfunction and mild to moderate mitral regurgitation. She was also found to have a renal mass of unclear etiology. She was started on ACEI and Lasix. Initial invasive work-up was deferred due to renal mass pending work-up. She was felt to be a poor candidate for renal surgery due to overall frailty, systolic heart failure and poor HD candidate if needed postop (if the kidney would need to be removed). Mikayla Reynolds, EP PA had seen the patient and had spoken with Dr. Ellison Reynolds at [facility], who noted the cyst had a 50-60% chance of being malignant. He planned to repeat imaging in 6 months and had no specific concerns of using ACE/ARB or spironolactone. It was felt her prognosis would be more clear based on repeat  imaging. She was not put on beta blocker as she had had reported junctional bradycardia on an EKG from 04/2019. Her ILR has not demonstrated any significant bradycardia however. 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 She did not attend her follow-up appointment in 09/2019 for unclear reasons, but then saw Dr. Marlou Reynolds in 11/2019. Medical therapy was continued with recommendation to consider PYP scan at a later date. Last labs personally reviewed 06/2019 K 4.0, Cr 1.03, Hgb 12.0, Plt 133, 05/2019 LDL 73, LFTs abnormal with alk phos 228, AST 48, ALT wnl.   She is seen back for follow-up today. She declined interpreter and requested her granddaughter interpret. Over the last two weeks she's noticed progressive DOE and fatigue. She has to stop and rest with resolution of symptoms. She also notes some shortness of breath and chest heaviness that happens at the onset of laying down to sleep. It subsides after a while of lying down. She is taking Lasix every other day at this point and reports her edema has resolved. No PND. Her appetite remains poor and weight is down another few lb. BP was obtained using pediatric cuff as in prior visit. No syncope. Her granddaughter reports they saw Dr. Gilford Reynolds recently with decrease in size of mass, with plans for surveillance [place] in 1 year. Her PCP entered the following note in 11/2019 - "Patient was seen 10/25/2019.  She had surveillance CAT scan of the abdomen which revealed significant decrease  in the size of complex cystic lesion either within or intimately associated with the right kidney.  Interval decrease in size suggests a cyst or sinus cyst complicated by infection.  Incidental finding is coronary artery atherosclerosis and aortic atherosclerosis, pancreatic divisum.  Plan is for surveillance on renal  ultrasound in 1 year."  Past Medical History:  Diagnosis Date  . Abnormal liver function   . Anemia, unspecified   . Chronic combined systolic and diastolic CHF (congestive heart failure) ([place])   . CKD (chronic kidney disease), stage III 02/19/2018  . Elevated troponin 12/27/2017  . Hyperlipidemia   . Junctional bradycardia   . Mitral regurgitation   . Pre-diabetes   . Renal mass   . Stroke [facility])     Past Surgical History:  Procedure Laterality Date  . abdominal wall surgery  2017  . APPENDECTOMY    . LOOP RECORDER INSERTION N/A 06/23/2018   Procedure: LOOP RECORDER INSERTION;  Surgeon: Mikayla Grayer, MD;  Location: [place] CV [facility];  Service: Cardiovascular;  Laterality: N/A;  . TEE WITHOUT CARDIOVERSION N/A 06/23/2018   Procedure: TRANSESOPHAGEAL ECHOCARDIOGRAM (TEE);  Surgeon: [name], MD;  Location: [facility] ENDOSCOPY;  Service: Cardiovascular;  Laterality: N/A;    Current Medications: Current Meds  Medication Sig  . atorvastatin (LIPITOR) 40 MG tablet Take 1 tablet (40 mg total) by mouth daily at 6 PM.  . furosemide (LASIX) 20 MG tablet Take 1 tablet (20 mg total) by mouth as needed. Take one tablet (20 mg) as needed for weight gain of >3lbs overnight, increased shortness of breath, or swelling  . lisinopril (ZESTRIL) 5 MG tablet Take 1 tablet (5 mg total) by mouth daily.  . Methylcellulose, Laxative, (FIBER THERAPY) 500 MG TABS Take by mouth daily.  . Polyethylene Glycol 3350 (MIRALAX PO) Take by mouth.  . spironolactone (ALDACTONE) 25 MG tablet Take 0.5 tablets (12.5 mg total) by mouth daily.  . [DISCONTINUED] aspirin EC 81 MG tablet Take 1 tablet (81 mg total) by mouth daily.      Allergies:   Patient has no known allergies.   Social History   Socioeconomic History  . Marital status: Widowed    Spouse name: Not on file  . Number of children: Not on file  . Years of education: Not on file  . Highest education level: Not on file  Occupational History  . Not  on file  Tobacco Use  . Smoking status: Never Smoker  . Smokeless tobacco: Never Used  Vaping Use  . Vaping Use: Never used  Substance and Sexual Activity  . Alcohol use: Not Currently  . Drug use: Not Currently  . Sexual activity: Not Currently  Other Topics Concern  . Not on file  Social History Narrative  . Not on file   Social Determinants of Health   Financial Resource Strain:   . Difficulty of Paying Living Expenses:   Food Insecurity:   . Worried About Charity fundraiser in the Last Year:   . Arboriculturist in the Last Year:   Transportation Needs:   . Film/video editor (Medical):   Marland Kitchen Lack of Transportation (Non-Medical):   Physical Activity:   . Days of Exercise per Week:   . Minutes of Exercise per Session:   Stress:   . Feeling of Stress :   Social Connections:   . Frequency of Communication with Friends and Family:   . Frequency of Social Gatherings with Friends and Family:   .  Attends Religious Services:   . Active Member of Clubs or Organizations:   . Attends Archivist Meetings:   Marland Kitchen Marital Status:      Family History:  The patient's family history includes Healthy in her brother and sister.  ROS:   Please see the history of present illness.  All other systems are reviewed and otherwise negative.    EKGs/Labs/Other Studies Reviewed:    Studies reviewed are outlined and summarized above. Reports included below if pertinent.  2D Echo 08/2019 1. Left ventricular ejection fraction, by visual estimation, is 25 to  30%. The left ventricle has severely decreased function. There is severely  increased left ventricular hypertrophy.  2. Elevated left atrial and left ventricular end-diastolic pressures.  3. Left ventricular diastolic parameters are consistent with Grade III  diastolic dysfunction (restrictive).  4. The left ventricle demonstrates global hypokinesis.  5. Global right ventricle has severely reduced systolic  function.The  right ventricular size is moderately enlarged. No increase in right  ventricular wall thickness.  6. Left atrial size was mildly dilated.  7. Right atrial size was normal.  8. The mitral valve is normal in structure. Moderate mitral valve  regurgitation. No evidence of mitral stenosis.  9. The tricuspid valve is normal in structure.  10. The aortic valve is normal in structure. Aortic valve regurgitation is  not visualized. No evidence of aortic valve sclerosis or stenosis.  11. Pulmonic regurgitation is mild.  12. The pulmonic valve was normal in structure. Pulmonic valve  regurgitation is mild.  13. Moderately elevated pulmonary artery systolic pressure.  14. The tricuspid regurgitant velocity is 3.22 m/s, and with an assumed  right atrial pressure of 15 mmHg, the estimated right ventricular systolic  pressure is moderately elevated at 56.5 mmHg.  15. The inferior vena cava is normal in size with greater than 50%  respiratory variability, suggesting right atrial pressure of 3 mmHg.  16. There is severe biventricular systolic dysfunction, valves thickening  and severe concentric LVH. Cardiac amyloidosis should be considered. A PYP  nuclear test for evaluation of TTR amyloidosis is recommended.  17. The average left ventricular global longitudinal strain is -10.4 %.     EKG:  EKG is ordered today, personally reviewed, demonstrating NSR 60bpm with first degree AVB, NSIVCD, low voltage in precordial leads, nonspecific STT changes   Recent Labs: 03/11/2019: TSH 2.420 04/02/2019: BNP 591.2 05/24/2019: ALT 21 06/15/2019: BUN 19; Creatinine, Ser 1.03; Hemoglobin 12.0; Platelets 133; Potassium 4.0; Sodium 136  Recent Lipid Panel    Component Value Date/Time   CHOL 147 05/24/2019 1628   TRIG 63 05/24/2019 1628   HDL 61 05/24/2019 1628   CHOLHDL 2.4 05/24/2019 1628   CHOLHDL 4.9 12/28/2017 0529   VLDL 15 12/28/2017 0529   LDLCALC 73 05/24/2019 1628    PHYSICAL EXAM:      VS:  BP 110/60   Pulse 60   Ht '5\' 3"'$  (1.6 m)   Wt 121 lb (54.9 kg)   SpO2 99%   BMI 21.43 kg/m   BMI: Body mass index is 21.43 kg/m.  GEN: Well nourished, well developed Asian F, in no acute distress, frail appearing but extremities are warm HEENT: normocephalic, atraumatic, no macroglossia Neck: no JVD, carotid bruits, or masses Cardiac: RRR, rare ectopy, no murmurs, rubs, or gallops, no edema  Respiratory:  clear to auscultation bilaterally, normal work of breathing GI: soft, nontender, nondistended, + BS MS: no deformity or atrophy Skin: warm and dry, no rash Neuro:  Alert and Oriented x 3, Strength and sensation are intact, follows commands Psych: euthymic mood, full affect  Wt Readings from Last 3 Encounters:  02/21/20 121 lb (54.9 kg)  11/17/19 124 lb 12.8 oz (56.6 kg)  09/23/19 123 lb (55.8 kg)     ASSESSMENT & PLAN:   1. Shortness of breath - she reports SOB with exertion and recumbency associated with chest heaviness with recumbency. She does not appear overtly volume overloaded on exam but given her severe heart failure, cannot exclude component of discrete fluid overload or angina decubitus. Her echocardiogram in 08/2019 suggested possible amyloidosis. She saw Dr. Marlou Reynolds in 11/2019 at which time he suggested PYP at a later time. I think it is time to pursue this for further workup. I will also get a CMET, CBC, and BNP today along with CXR to further evaluate. We discussed how invasive she would like to be with her workup, as she may benefit from a R/LHC to further evaluate, contingent on what her initial evaluation shows. She stated she would want to do whatever our team would recommend. I will review with Dr. Marlou Reynolds.  2. Chronic combined CHF - see comments above. Will hold off med titration until we see labs back. Her soft blood pressure has prohibited aggressive titration in the past. Given constellation of findings would recommend referral to advanced heart failure  clinic. She is agreeable. Of note, she did stop ASA due to occasional crusted blood in her nose. No overt concerning bleeding otherwise. Given her history of stroke I have recommended she restart as a trial - her ability to tolerate ASA will infer decision-making about ischemic workup. She was advised to contact PCP if nosebleeds occur as cauterization would be an option to allow for continuation of antiplatelet therapy. 3. H/o junctional bradycardia - reported to have been observed on 04/2019 EKG but one could argue that there were P waves on this tracing. No significant bradycardia noted on ILR, although baseline HR tends to be 50s-60s with first degree AVB so will hold off on trial initiation. 4. CKD stage III - recheck with updated labs.  5. Mitral regurgitation - unclear how much this is contributing. Will obtain PYP to help guide next steps; may need consideration of updated imaging for this if the PYP is unrevealing.  Disposition: Will refer to [facility] given concern for amyloidosis and tendency for softer blood pressure, prohibiting aggressive medication titration.  Medication Adjustments/Labs and Tests Ordered: Current medicines are reviewed at length with the patient today.  Concerns regarding medicines are outlined above. Medication changes, Labs and Tests ordered today are summarized above and listed in the Patient Instructions accessible in Encounters.   Signed, Charlie Pitter, PA-C  02/21/2020 3:06 PM    [facility] [address], [place], [place]  [address] Phone: [phone]; Fax: [phone]

## 2020-02-21 ENCOUNTER — Ambulatory Visit
Admission: RE | Admit: 2020-02-21 | Discharge: 2020-02-21 | Disposition: A | Discharge status: Home / Self Care | Payer: Medicaid Other | Source: Ambulatory Visit | Attending: Physician Assistant | Admitting: Physician Assistant

## 2020-02-21 ENCOUNTER — Encounter: Payer: Self-pay | Admitting: Physician Assistant

## 2020-02-21 ENCOUNTER — Ambulatory Visit: Payer: Medicaid Other | Admitting: Physician Assistant

## 2020-02-21 ENCOUNTER — Other Ambulatory Visit: Payer: Self-pay

## 2020-02-21 VITALS — BP 110/60 | HR 60 | Ht 63.0 in | Wt 121.0 lb

## 2020-02-21 DIAGNOSIS — I5042 Chronic combined systolic (congestive) and diastolic (congestive) heart failure: Secondary | ICD-10-CM | POA: Diagnosis not present

## 2020-02-21 DIAGNOSIS — N183 Chronic kidney disease, stage 3 unspecified: Secondary | ICD-10-CM

## 2020-02-21 DIAGNOSIS — R0602 Shortness of breath: Secondary | ICD-10-CM

## 2020-02-21 DIAGNOSIS — J984 Other disorders of lung: Secondary | ICD-10-CM | POA: Diagnosis not present

## 2020-02-21 DIAGNOSIS — I340 Nonrheumatic mitral (valve) insufficiency: Secondary | ICD-10-CM

## 2020-02-21 DIAGNOSIS — R001 Bradycardia, unspecified: Secondary | ICD-10-CM | POA: Diagnosis not present

## 2020-02-21 DIAGNOSIS — I517 Cardiomegaly: Secondary | ICD-10-CM | POA: Diagnosis not present

## 2020-02-21 MED ORDER — ASPIRIN EC 81 MG PO TBEC: 81.0000 mg | DELAYED_RELEASE_TABLET | Freq: Every day | ORAL | 3 refills | Status: AC

## 2020-02-21 NOTE — Patient Instructions (Addendum)
Medication Instructions:  Your physician has recommended you make the following change in your medication:  1.  RESTART Aspirin 81 mg daily   *If you need a refill on your cardiac medications before your next appointment, please call your pharmacy*   Lab Work: TODAY:  CMET, PRO BNP, & CBC   If you have labs (blood work) drawn today and your tests are completely normal, you will receive your results only by: Marland Kitchen MyChart Message (if you have MyChart) OR . A paper copy in the mail If you have any lab test that is abnormal or we need to change your treatment, we will call you to review the results.   Testing/Procedures: Your physician recommends that you have a PYP Scan (Amyloid).  There is no prep for this study, you may eat, drink, and take your medications as normal.  Your physician recommends you have a chest x-ray TODAY, go to:  [facility], [place]. [address].  # [phone]  .  A chest x-ray takes a picture of the organs and structures inside the chest, including the heart, lungs, and blood vessels. This test can show several things, including, whether the heart is enlarges; whether fluid is building up in the lungs; and whether pacemaker / defibrillator leads are still in place.    Follow-Up: At [facility], you and your health needs are our priority.  As part of our continuing mission to provide you with exceptional heart care, we have created designated Provider Care Teams.  These Care Teams include your primary Cardiologist (physician) and Advanced Practice Providers (APPs -  Physician Assistants and Nurse Practitioners) who all work together to provide you with the care you need, when you need it.  We recommend signing up for the patient portal called "MyChart".  Sign up information is provided on this After Visit Summary.  MyChart is used to connect with patients for Virtual Visits (Telemedicine).  Patients are able to view lab/test results, encounter notes,  upcoming appointments, etc.  Non-urgent messages can be sent to your provider as well.   To learn more about what you can do with MyChart, go to [email or web address].    Your physician recommends that you schedule a follow-up appointment in: [place]   Other Instructions

## 2020-02-22 ENCOUNTER — Telehealth: Payer: Self-pay | Admitting: *Deleted

## 2020-02-22 DIAGNOSIS — I429 Cardiomyopathy, unspecified: Secondary | ICD-10-CM

## 2020-02-22 DIAGNOSIS — I5042 Chronic combined systolic (congestive) and diastolic (congestive) heart failure: Secondary | ICD-10-CM

## 2020-02-22 LAB — COMPREHENSIVE METABOLIC PANEL
ALT: 16 IU/L (ref 0–32)
AST: 49 IU/L — ABNORMAL HIGH (ref 0–40)
Albumin/Globulin Ratio: 1 — ABNORMAL LOW (ref 1.2–2.2)
Albumin: 4.1 g/dL (ref 3.7–4.7)
Alkaline Phosphatase: 242 IU/L — ABNORMAL HIGH (ref 48–121)
BUN/Creatinine Ratio: 15 (ref 12–28)
BUN: 22 mg/dL (ref 8–27)
Bilirubin Total: 2.6 mg/dL — ABNORMAL HIGH (ref 0.0–1.2)
CO2: 23 mmol/L (ref 20–29)
Calcium: 8.8 mg/dL (ref 8.7–10.3)
Chloride: 100 mmol/L (ref 96–106)
Creatinine, Ser: 1.45 mg/dL — ABNORMAL HIGH (ref 0.57–1.00)
GFR calc Af Amer: 40 mL/min/{1.73_m2} — ABNORMAL LOW (ref >59)
GFR calc non Af Amer: 35 mL/min/{1.73_m2} — ABNORMAL LOW (ref >59)
Globulin, Total: 4 g/dL (ref 1.5–4.5)
Glucose: 105 mg/dL — ABNORMAL HIGH (ref 65–99)
Potassium: 4.1 mmol/L (ref 3.5–5.2)
Sodium: 136 mmol/L (ref 134–144)
Total Protein: 8.1 g/dL (ref 6.0–8.5)

## 2020-02-22 LAB — CBC
Hematocrit: 33.7 % — ABNORMAL LOW (ref 34.0–46.6)
Hemoglobin: 11.4 g/dL (ref 11.1–15.9)
MCH: 34.5 pg — ABNORMAL HIGH (ref 26.6–33.0)
MCHC: 33.8 g/dL (ref 31.5–35.7)
MCV: 102 fL — ABNORMAL HIGH (ref 79–97)
Platelets: 129 10*3/uL — ABNORMAL LOW (ref 150–450)
RBC: 3.3 x10E6/uL — ABNORMAL LOW (ref 3.77–5.28)
RDW: 13.6 % (ref 11.7–15.4)
WBC: 3 10*3/uL — ABNORMAL LOW (ref 3.4–10.8)

## 2020-02-22 LAB — PRO B NATRIURETIC PEPTIDE: NT-Pro BNP: 4052 pg/mL — ABNORMAL HIGH (ref 0–738)

## 2020-02-22 MED ORDER — LISINOPRIL 5 MG PO TABS: 5.0000 mg | ORAL_TABLET | Freq: Every day | ORAL | 3 refills | Status: DC

## 2020-02-22 MED ORDER — FUROSEMIDE 20 MG PO TABS: 20.0000 mg | ORAL_TABLET | Freq: Every day | ORAL | 3 refills | Status: DC

## 2020-02-22 NOTE — Telephone Encounter (Signed)
DPR ok to s/w pt's granddaughter who has been advised of results by phone with verbal understanding. I advised of medication changes: Hold Lisinopril until further advised by cardiology; increase Lasix to 20 mg everyday, continue the Spironolactone. Advised to limit Na intake and fluid intake no more than 64 oz a day. Advised of recommendation to have pt see the HF clinic in 1 week. I assured the pt's granddaughter that I will send a message to the HF Clinic and have them reach out to her tomorrow to make an appt. I did also advise if HF Clinic cannot get her in in 1 week then we will make appt here in our office with lab work to be done the same day. I have sent in a new Rx for Lasix per granddaughter to [facility] on [facility]. Granddaughter has given verbal read back to all instructions and has thanked me for the call and our help. Confirmed nuclear appt 02/28/20. Patient notified of result.  Please refer to phone note from today for complete details.   Julaine Hua, [facility] 02/22/2020 5:38 PM

## 2020-02-22 NOTE — Progress Notes (Signed)
Please let pt/granddaughter know that I reviewed her results in detail with one of our cardiologists, Dr. Sallyanne Kuster (in Dr. Marlou Porch' absence) who also reviewed her echo and agrees there is concern for the heart muscle problem we talked about yesterday ("amyloid").  Her CXR does not show any acute abnormalities but her BNP is up suggesting additional fluid retention. She also has evidence that her kidney function has slightly worsened, which may be related to worsening heart failure. Her liver function and white blood cell/platelet count appear abnormal but similar to prior. These take a back seat for now until we see what's going on with the heart, but she should have f/u primary care for them eventually.  Per discussion with Dr. Sallyanne Kuster, would recommend she hold lisinopril and increase Lasix to 20mg  every day (not just as-needed). Continue spironolactone. Restrict fluid intake to less than 64 oz daily and limit salt. Can we see if there are any appointment availability for heart failure team to see her in clinic in 1 week? If not, try to fit in in general cards clinic with a BMET same day - qualifies for TOC appt to hopefully keep her out of the hospital. Further workup will depend on PYP study which is scheduled for 6/28. Thanks, [employer]

## 2020-02-22 NOTE — Telephone Encounter (Signed)
-----   Message from Jeanann Lewandowsky, [place] sent at 02/22/2020  4:29 PM EDT ----- I normally don't fwd to CMA cause I know how busy it can be, but Im at the screening table. Thanks so much Arbie Cookey!

## 2020-02-23 ENCOUNTER — Telehealth: Payer: Self-pay | Admitting: Internal Medicine

## 2020-02-23 NOTE — Telephone Encounter (Signed)
Called patient and LVM to return call and schedule a f/u appt in 1-3 weeks with her PCP.

## 2020-02-24 ENCOUNTER — Telehealth (HOSPITAL_COMMUNITY): Payer: Self-pay | Admitting: *Deleted

## 2020-02-24 NOTE — Telephone Encounter (Signed)
Left message, per DPR, for appointment reminder for upcoming Amyloid test, using the language line. Kirstie Peri

## 2020-02-25 ENCOUNTER — Telehealth: Payer: Self-pay | Admitting: *Deleted

## 2020-02-25 NOTE — Telephone Encounter (Signed)
Extremely appreciated. Thank you for following up with them.

## 2020-02-25 NOTE — Telephone Encounter (Signed)
Call placed to pt's granddaughter, Tu, since Heart Failure hadn't contacted her as of today, for a f/u appt, I went ahead and scheduled pt to see Jory Sims, NP, 03/02/20 (1st available at either location) at 11:15. Granddaughter was advised if Heart Failure contacted her and can see pt before then, that she cancel ours.  Just wanted pt to have an appt since access is very limited and didn't want to wait until last minute and have no slot available.

## 2020-02-28 ENCOUNTER — Ambulatory Visit (HOSPITAL_COMMUNITY): Payer: Medicaid Other | Attending: Cardiovascular Disease

## 2020-02-28 ENCOUNTER — Other Ambulatory Visit: Payer: Self-pay

## 2020-02-28 DIAGNOSIS — R001 Bradycardia, unspecified: Secondary | ICD-10-CM

## 2020-02-28 DIAGNOSIS — I340 Nonrheumatic mitral (valve) insufficiency: Secondary | ICD-10-CM | POA: Diagnosis not present

## 2020-02-28 DIAGNOSIS — N183 Chronic kidney disease, stage 3 unspecified: Secondary | ICD-10-CM | POA: Diagnosis not present

## 2020-02-28 DIAGNOSIS — I5042 Chronic combined systolic (congestive) and diastolic (congestive) heart failure: Secondary | ICD-10-CM

## 2020-02-28 DIAGNOSIS — R0602 Shortness of breath: Secondary | ICD-10-CM | POA: Diagnosis not present

## 2020-02-28 MED ORDER — TECHNETIUM TC 99M PYROPHOSPHATE
20.3000 | Freq: Once | INTRAVENOUS | Status: AC
Start: 2020-02-28 — End: 2020-02-28
  Administered 2020-02-28: 20.3 via INTRAVENOUS

## 2020-02-29 ENCOUNTER — Telehealth: Payer: Self-pay | Admitting: Physician Assistant

## 2020-02-29 NOTE — Telephone Encounter (Signed)
New message   Patient's grandaughter states that she is returning Mikayla Reynolds. Call. Please call.

## 2020-02-29 NOTE — Telephone Encounter (Signed)
-----   Message from Charlie Pitter, [place] sent at 02/29/2020  9:24 AM EDT ----- Please let pt/granddaughter know that PYP scan was equivocal, meaning not completely negative but not completely positive either. 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 How is she feeling on increase dose of Lasix? (Can you call HF clinic to see about getting her in?)

## 2020-02-29 NOTE — Telephone Encounter (Signed)
Returned call to Tu, pt's granddaughter. See result note.

## 2020-03-01 ENCOUNTER — Other Ambulatory Visit (HOSPITAL_COMMUNITY): Payer: Self-pay | Admitting: Internal Medicine

## 2020-03-01 ENCOUNTER — Encounter (HOSPITAL_COMMUNITY): Payer: Self-pay | Admitting: Internal Medicine

## 2020-03-01 ENCOUNTER — Other Ambulatory Visit: Payer: Self-pay

## 2020-03-01 ENCOUNTER — Ambulatory Visit (HOSPITAL_COMMUNITY)
Admission: RE | Admit: 2020-03-01 | Discharge: 2020-03-01 | Disposition: A | Discharge status: Home / Self Care | Payer: Medicaid Other | Source: Ambulatory Visit | Attending: Internal Medicine | Admitting: Internal Medicine

## 2020-03-01 VITALS — BP 116/68 | HR 64 | Ht 65.0 in | Wt 116.4 lb

## 2020-03-01 DIAGNOSIS — E854 Organ-limited amyloidosis: Secondary | ICD-10-CM | POA: Diagnosis not present

## 2020-03-01 DIAGNOSIS — Z79899 Other long term (current) drug therapy: Secondary | ICD-10-CM | POA: Insufficient documentation

## 2020-03-01 DIAGNOSIS — I5042 Chronic combined systolic (congestive) and diastolic (congestive) heart failure: Secondary | ICD-10-CM

## 2020-03-01 DIAGNOSIS — Z8673 Personal history of transient ischemic attack (TIA), and cerebral infarction without residual deficits: Secondary | ICD-10-CM | POA: Insufficient documentation

## 2020-03-01 DIAGNOSIS — R531 Weakness: Secondary | ICD-10-CM | POA: Diagnosis not present

## 2020-03-01 DIAGNOSIS — Z7982 Long term (current) use of aspirin: Secondary | ICD-10-CM | POA: Insufficient documentation

## 2020-03-01 DIAGNOSIS — N183 Chronic kidney disease, stage 3 unspecified: Secondary | ICD-10-CM | POA: Diagnosis not present

## 2020-03-01 DIAGNOSIS — R7303 Prediabetes: Secondary | ICD-10-CM | POA: Insufficient documentation

## 2020-03-01 DIAGNOSIS — I340 Nonrheumatic mitral (valve) insufficiency: Secondary | ICD-10-CM | POA: Diagnosis not present

## 2020-03-01 DIAGNOSIS — E785 Hyperlipidemia, unspecified: Secondary | ICD-10-CM | POA: Diagnosis not present

## 2020-03-01 DIAGNOSIS — D649 Anemia, unspecified: Secondary | ICD-10-CM | POA: Insufficient documentation

## 2020-03-01 DIAGNOSIS — R630 Anorexia: Secondary | ICD-10-CM | POA: Diagnosis not present

## 2020-03-01 DIAGNOSIS — I43 Cardiomyopathy in diseases classified elsewhere: Secondary | ICD-10-CM | POA: Diagnosis not present

## 2020-03-01 LAB — COMPREHENSIVE METABOLIC PANEL
ALT: 27 U/L (ref 0–44)
AST: 71 U/L — ABNORMAL HIGH (ref 15–41)
Albumin: 3.6 g/dL (ref 3.5–5.0)
Alkaline Phosphatase: 220 U/L — ABNORMAL HIGH (ref 38–126)
Anion gap: 10 (ref 5–15)
BUN: 21 mg/dL (ref 8–23)
CO2: 23 mmol/L (ref 22–32)
Calcium: 9 mg/dL (ref 8.9–10.3)
Chloride: 102 mmol/L (ref 98–111)
Creatinine, Ser: 1.68 mg/dL — ABNORMAL HIGH (ref 0.44–1.00)
GFR calc Af Amer: 34 mL/min — ABNORMAL LOW (ref >60)
GFR calc non Af Amer: 29 mL/min — ABNORMAL LOW (ref >60)
Glucose, Bld: 114 mg/dL — ABNORMAL HIGH (ref 70–99)
Potassium: 3.2 mmol/L — ABNORMAL LOW (ref 3.5–5.1)
Sodium: 135 mmol/L (ref 135–145)
Total Bilirubin: 2.4 mg/dL — ABNORMAL HIGH (ref 0.3–1.2)
Total Protein: 8.7 g/dL — ABNORMAL HIGH (ref 6.5–8.1)

## 2020-03-01 NOTE — Patient Instructions (Signed)
Labs done today, we will notify you for abnormal results  Genetic test has been done, this has to be sent to [place] to be processed and can take 1-2 weeks to get results back.  We will let you know the results.  Skeletal Survey needed  Cardiac MRI needed  Your physician recommends that you schedule a follow-up appointment in: 4-6 VIRTUAL APPOINTMENT  If you have any questions or concerns before your next appointment please send [place] a message through [place] or call our office at [phone].    TO LEAVE A MESSAGE FOR THE NURSE SELECT OPTION 2, PLEASE LEAVE A MESSAGE INCLUDING: . YOUR NAME . DATE OF BIRTH . CALL BACK NUMBER . REASON FOR CALL**this is important as we prioritize the call backs  [place] AS LONG AS YOU CALL BEFORE 4:00 PM  At the [facility], you and your health needs are our priority. As part of our continuing mission to provide you with exceptional heart care, we have created designated Provider Care Teams. These Care Teams include your primary Cardiologist (physician) and Advanced Practice Providers (APPs- Physician Assistants and Nurse Practitioners) who all work together to provide you with the care you need, when you need it.   You may see any of the following providers on your designated Care Team at your next follow up: Marland Kitchen Dr Glori Bickers . Dr Loralie Champagne . Darrick Grinder, NP . Lyda Jester, PA . Audry Riles, PharmD   Please be sure to bring in all your medications bottles to every appointment.

## 2020-03-01 NOTE — Progress Notes (Signed)
Blood collected for TTR genetic testing per Dr Bensimhon.  Order form completed and both shipped by [employer] to [facility].  

## 2020-03-01 NOTE — Progress Notes (Signed)
ADVANCED HF CLINIC CONSULT NOTE  Referring Physician: Melina Copa, PA-C Primary Cardiologist: Marlou Porch  HPI:  Mikayla Reynolds is a 79 y.o. female with history of CKD stage III, prediabetes, hyperlipidemia, cryptogenic stroke s/p ILR, renal mass, chronic combined CHF, moderate mitral regurgitation who is referred by Melina Copa PA-C for further evaluation of CHF.   She was initially seen by Cardiology in 12/2017 due to elevated troponin in context of stroke. It was doubted that this was related to ACS; recommendation was to consider OP stress test.. She had ILR inserted in 06/2018 as part of work-up for stroke. At that time, EF was 50-55% by TEE.   She subsequently followed with EP. In the summer of 2020, she developed issues with poor appetite and weight loss as well as progressive abdominal fullness and anemia. Her PCP obtained an echocardiogram 04/06/2019 which showed newly decreased LV systolic function, EF 03-55%, moderate asymmetric LV hypertrophy, diastolic dysfunction and mild to moderate mitral regurgitation. She was also found to have a renal mass of unclear etiology. She was started on ACEI and Lasix. Initial invasive work-up was deferred due to renal mass pending work-up. She was felt to be a poor candidate for renal surgery due to overall frailty, systolic heart failure and poor HD candidate if needed postop (if the kidney would need to be removed). [name], EP PA had seen the patient and had spoken with Dr. Ellison Hughs at [facility], who noted the cyst had a 50-60% chance of being malignant.   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 Saw Dr. Marlou Porch in 11/2019. Medical therapy was continued with recommendation to consider PYP scan at a later date. Last labs personally  reviewed 06/2019 K 4.0, Cr 1.03, Hgb 12.0, Plt 133, 05/2019 LDL 73, LFTs abnormal with alk phos 228, AST 48, ALT wnl.   Repeat CT scan 2/21 showed decrease in size of renal cyst. Felt not to be malignant.   Saw Dayna Dunn 02/21/20 and had NYHA III HF symptoms without frank volume overload. Referred here for consideration of R/L cath and further w/u for cardiac amyloidosis. Lasix increased from 20 qod to 20 daily. Creatinine 1.0 -> 1.4   PYP 02/28/20 H/CLL 1.5 visual grade 2  (read as equivocal) - I am unable to pull up images to view on PACS  NT-pro-BNP 4,052  Protein gap 4.0 ALk phos 242  She is here with her granddaughter. We have a medical interpreter with her as well. She is not very interactive. Reports several months of orthopnea. Says she feels weak. Low energy. Lives at home with son and daughter-in-law. Can do basic ADLs but gets very weak when she tries to clean or do more activity. Poor appetite. No weight loss. Mild LE edema. Improved with lasix. No exertional CP but can get CP if she sits too long. Feels heavy.    Review of Systems: [y] = yes, '[ ]'$  = no   General: Weight gain '[ ]'$ ; Weight loss '[ ]'$ ; Anorexia '[ ]'$ ; Fatigue [ y]; Fever '[ ]'$ ; Chills '[ ]'$ ; Weakness '[ ]'$   Cardiac: Chest pain/pressure '[ ]'$ ; Resting SOB '[ ]'$ ; Exertional SOB Blue.Reese ]; Orthopnea Blue.Reese ]; Pedal Edema Blue.Reese ]; Palpitations '[ ]'$ ; Syncope '[ ]'$ ; Presyncope '[ ]'$ ; Paroxysmal nocturnal dyspnea'[ ]'$   Pulmonary: Cough '[ ]'$ ; Wheezing'[ ]'$ ; Hemoptysis'[ ]'$ ; Sputum '[ ]'$ ;  Snoring '[ ]'$   GI: Vomiting'[ ]'$ ; Dysphagia'[ ]'$ ; Melena'[ ]'$ ; Hematochezia '[ ]'$ ; Heartburn'[ ]'$ ; Abdominal pain '[ ]'$ ; Constipation '[ ]'$ ; Diarrhea '[ ]'$ ; BRBPR '[ ]'$   GU: Hematuria'[ ]'$ ; Dysuria '[ ]'$ ; Nocturia'[ ]'$   Vascular: Pain in legs with walking '[ ]'$ ; Pain in feet with lying flat '[ ]'$ ; Non-healing sores '[ ]'$ ; Stroke '[ ]'$ ; TIA '[ ]'$ ; Slurred speech '[ ]'$ ;  Neuro: Headaches'[ ]'$ ; Vertigo'[ ]'$ ; Seizures'[ ]'$ ; Paresthesias'[ ]'$ ;Blurred vision '[ ]'$ ; Diplopia '[ ]'$ ; Vision changes '[ ]'$   Ortho/Skin: Arthritis [ y]; Joint pain  Blue.Reese ]; Muscle pain '[ ]'$ ; Joint swelling '[ ]'$ ; Back Pain '[ ]'$ ; Rash '[ ]'$   Psych: Depression'[ ]'$ ; Anxiety'[ ]'$   Heme: Bleeding problems '[ ]'$ ; Clotting disorders '[ ]'$ ; Anemia '[ ]'$   Endocrine: Diabetes '[ ]'$ ; Thyroid dysfunction'[ ]'$    Past Medical History:  Diagnosis Date   Abnormal liver function    Anemia, unspecified    Chronic combined systolic and diastolic CHF (congestive heart failure) (HCC)    CKD (chronic kidney disease), stage III 02/19/2018   Elevated troponin 12/27/2017   Hyperlipidemia    Junctional bradycardia    Mitral regurgitation    Pre-diabetes    Renal mass    Stroke [facility])     Current Outpatient Medications  Medication Sig Dispense Refill   aspirin EC 81 MG tablet Take 1 tablet (81 mg total) by mouth daily. Swallow whole. 90 tablet 3   atorvastatin (LIPITOR) 40 MG tablet Take 1 tablet (40 mg total) by mouth daily at 6 PM. 90 tablet 3   furosemide (LASIX) 20 MG tablet Take 1 tablet (20 mg total) by mouth daily. TAKE 1 TABLET ONCE A DAY 90 tablet 3   Methylcellulose, Laxative, (FIBER THERAPY) 500 MG TABS Take by mouth daily.     Polyethylene Glycol 3350 (MIRALAX PO) Take by mouth.     spironolactone (ALDACTONE) 25 MG tablet Take 0.5 tablets (12.5 mg total) by mouth daily. 45 tablet 3   lisinopril (ZESTRIL) 5 MG tablet Take 1 tablet (5 mg total) by mouth daily. [place], PAC 02/22/20 (Patient not taking: Reported on 03/01/2020) 90 tablet 3   No current facility-administered medications for this encounter.    No Known Allergies    Social History   Socioeconomic History   Marital status: Widowed    Spouse name: Not on file   Number of children: Not on file   Years of education: Not on file   Highest education level: Not on file  Occupational History   Not on file  Tobacco Use   Smoking status: Never Smoker   Smokeless tobacco: Never Used  Vaping Use   Vaping Use: Never used  Substance and Sexual Activity   Alcohol use: Not Currently     Drug use: Not Currently   Sexual activity: Not Currently  Other Topics Concern   Not on file  Social History Narrative   Not on file   Social Determinants of Health   Financial Resource Strain:    Difficulty of Paying Living Expenses:   Food Insecurity:    Worried About [place] in the Last Year:    Arboriculturist in the Last Year:   Transportation Needs:    Film/video editor (Medical):    Lack of Transportation (Non-Medical):   Physical Activity:    Days of Exercise per Week:    Minutes of Exercise per Session:   Stress:    Feeling of  Stress :   Social Connections:    Frequency of Communication with Friends and Family:    Frequency of Social Gatherings with Friends and Family:    Attends Religious Services:    Active Member of Clubs or Organizations:    Attends Music therapist:    Marital Status:   Intimate Partner Violence:    Fear of Current or Ex-Partner:    Emotionally Abused:    Physically Abused:    Sexually Abused:       Family History  Problem Relation Age of Onset   Healthy Sister    Healthy Brother     Vitals:   03/01/20 1256  BP: 116/68  Pulse: 64  SpO2: 96%  Weight: 52.8 kg (116 lb 6.4 oz)  Height: '5\' 5"'$  (1.651 m)    PHYSICAL EXAM: General:  Thin Weak appearing. No respiratory difficulty HEENT: normal Neck: supple. JVP 7-8 Carotids 2+ bilat; no bruits. No lymphadenopathy or thryomegaly appreciated. Cor: PMI nondisplaced. Regular rate & rhythm. 2/6 TR Lungs: clear Abdomen: soft, nontender, nondistended. No hepatosplenomegaly. No bruits or masses. Good bowel sounds. Extremities: no cyanosis, clubbing, rash, trace edema Neuro: alert & oriented x 3, cranial nerves grossly intact. moves all 4 extremities w/o difficulty. Affect pleasant.  ECG: NSR 60 1AVB 277m IVCD 126 ms Personally reviewed   ASSESSMENT & PLAN:   1. Chronic systolic HF - echo reviewed personally EF 25%. 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  - Volume status much improved with recent adjustment of lasix by DMelina CopaPA-C - continue low-dose lasix, spiro and lisinopril as BP tolerates. Suspect we will have to stop soon and consider midodrine for BP support - will check myeloma panel, cMRI, skeletal survey and b2-microglobulin - no role for cath at this time - Discussed the fact that if this is AL amyloid she may be past the point at which she will be able t tolerate treatment from a functional standpoint.  - Labs today - Likely will need referral to Dr. DLorenso Courierin oncology as we get results back.   Total time spent 50 minutes. Over half that time spent discussing above.   DGlori Bickers MD  8:02 PM

## 2020-03-01 NOTE — Progress Notes (Signed)
ReDS Vest / Clip - 03/01/20 1300      ReDS Vest / Clip   Station Marker A    Ruler Value 27.5    ReDS Value Range Low volume    ReDS Actual Value 31    Anatomical Comments sitting

## 2020-03-02 ENCOUNTER — Ambulatory Visit: Payer: Medicaid Other | Admitting: Adult Health

## 2020-03-02 LAB — MULTIPLE MYELOMA PANEL, SERUM
Albumin SerPl Elph-Mcnc: 3.7 g/dL (ref 2.9–4.4)
Albumin/Glob SerPl: 0.8 (ref 0.7–1.7)
Alpha 1: 0.3 g/dL (ref 0.0–0.4)
Alpha2 Glob SerPl Elph-Mcnc: 0.8 g/dL (ref 0.4–1.0)
B-Globulin SerPl Elph-Mcnc: 0.9 g/dL (ref 0.7–1.3)
Gamma Glob SerPl Elph-Mcnc: 2.9 g/dL — ABNORMAL HIGH (ref 0.4–1.8)
Globulin, Total: 4.9 g/dL — ABNORMAL HIGH (ref 2.2–3.9)
IgA: 57 mg/dL — ABNORMAL LOW (ref 64–422)
IgG (Immunoglobin G), Serum: 3397 mg/dL — ABNORMAL HIGH (ref 586–1602)
IgM (Immunoglobulin M), Srm: 73 mg/dL (ref 26–217)
M Protein SerPl Elph-Mcnc: 2.4 g/dL — ABNORMAL HIGH
Total Protein ELP: 8.6 g/dL — ABNORMAL HIGH (ref 6.0–8.5)

## 2020-03-02 LAB — BETA 2 MICROGLOBULIN, SERUM: Beta-2 Microglobulin: 5.4 mg/L — ABNORMAL HIGH (ref 0.6–2.4)

## 2020-03-03 ENCOUNTER — Telehealth: Payer: Self-pay | Admitting: Hematology and Oncology

## 2020-03-03 ENCOUNTER — Ambulatory Visit (INDEPENDENT_AMBULATORY_CARE_PROVIDER_SITE_OTHER): Payer: Medicaid Other | Admitting: *Deleted

## 2020-03-03 DIAGNOSIS — I5042 Chronic combined systolic (congestive) and diastolic (congestive) heart failure: Secondary | ICD-10-CM

## 2020-03-03 LAB — CUP PACEART REMOTE DEVICE CHECK
Date Time Interrogation Session: 20210701230355
Implantable Pulse Generator Implant Date: 20191022

## 2020-03-03 NOTE — Progress Notes (Signed)
We will plan to have her scheduled at 2:00 pm on 03/13/2020. Thank you for the referral, happy to see her.

## 2020-03-03 NOTE — Telephone Encounter (Signed)
I received a staff msg from Dr. Lorenso Courier to schedule Mikayla Reynolds to see him on 7/12 at 2pm. I cld and provided the appt date and time to the pt's granddaughter. Aware to arrive 15 minutes early.

## 2020-03-08 NOTE — Progress Notes (Signed)
Carelink Summary Report / Loop Recorder 

## 2020-03-09 ENCOUNTER — Telehealth: Payer: Self-pay | Admitting: *Deleted

## 2020-03-09 ENCOUNTER — Ambulatory Visit (HOSPITAL_COMMUNITY)
Admission: RE | Admit: 2020-03-09 | Discharge: 2020-03-09 | Disposition: A | Discharge status: Home / Self Care | Payer: Medicaid Other | Source: Ambulatory Visit | Attending: Internal Medicine | Admitting: Internal Medicine

## 2020-03-09 ENCOUNTER — Other Ambulatory Visit: Payer: Self-pay

## 2020-03-09 DIAGNOSIS — I5042 Chronic combined systolic (congestive) and diastolic (congestive) heart failure: Secondary | ICD-10-CM | POA: Diagnosis not present

## 2020-03-09 DIAGNOSIS — C9000 Multiple myeloma not having achieved remission: Secondary | ICD-10-CM | POA: Diagnosis not present

## 2020-03-09 MED ORDER — FUROSEMIDE 20 MG PO TABS: 20.0000 mg | ORAL_TABLET | ORAL | 3 refills | Status: DC

## 2020-03-09 MED ORDER — POTASSIUM CHLORIDE CRYS ER 20 MEQ PO TBCR
EXTENDED_RELEASE_TABLET | ORAL | 0 refills | Status: AC
Start: 2020-03-09 — End: ?

## 2020-03-09 NOTE — Addendum Note (Signed)
Addended by: Douglass Rivers D on: 03/09/2020 09:41 AM   Modules accepted: Level of Service

## 2020-03-09 NOTE — Telephone Encounter (Signed)
-----   Message from Scarlette Calico, RN sent at 03/09/2020 10:51 AM EDT -----  ----- Message ----- From: Charlie Pitter, PA-C Sent: 03/03/2020   1:45 PM EDT To: Scarlette Calico, RN  Fabio Pierce, I discussed labs below with DB (low K and elevated Cr). Per our discussion could you have her take KCl 11meq for 2 days (not ongoing), and change Lasix back to every other day? Remain off lisinopril for now. Thanks!

## 2020-03-12 NOTE — Progress Notes (Signed)
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 Ordered during evaluation for cardiomyopathy.  2) 03/13/2020: establish care with Dr. 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 female with medical history significant for CHF, CKD, HLD, CVA, and DM type II who presents for evaluation of a monoclonal gammopathy.   On review of the previous records Mikayla Reynolds was seen by Dr. Haroldine Laws on 03/01/2020.  As part of his evaluation for congestive heart failure an SPEP was ordered which showed a monoclonal protein of 2.4 and a beta-2 microglobulin of 5.4.  Due to concern for amyloidosis as the cause of this patient's cardiomyopathy she was referred to hematology for further evaluation and management. Of note the patient underwent Myocardial amyloid imaging on 02/28/2020 that was equivocal.   On exam today Mikayla Reynolds is accompanied by her son and a hospital approved interpreter.  She reports that she has been having issues with shortness of breath because "her heart is weak".  She also notes that she is aware that she is having trouble with her kidneys.  She notes that she is having improvement in the swelling in her lower extremities due to starting Lasix and spironolactone.  She also reports that she is physically active and is able to walk for about 20 minutes before she has to sit down due to shortness of breath.    On further discussion she notes that she has been having some issues with constipation but that  she was prescribed medication to help relieve this and it has been effective.  She notes that she has no history of smoking or alcohol use and that she does have a family history of heart disease, particularly in her mother.  She currently denies having any issues with fevers, chills, sweats, nausea, vomiting and diarrhea.  She has been having some issues with weight loss with 4 pounds lost since 02/21/2020 and 7 pounds lost since March 2021.  A full 10 point ROS is otherwise negative.  MEDICAL HISTORY:  Past Medical History:  Diagnosis Date  . Abnormal liver function   . Anemia, unspecified   . Chronic combined systolic and diastolic CHF (congestive heart failure) ([place])   . CKD (chronic kidney disease), stage III 02/19/2018  . Elevated troponin 12/27/2017  . Hyperlipidemia   . Junctional bradycardia   . Mitral regurgitation   . Pre-diabetes   . Renal mass   . Stroke [facility])     SURGICAL HISTORY: Past Surgical History:  Procedure Laterality Date  . abdominal wall surgery  2017  . APPENDECTOMY    . LOOP RECORDER INSERTION N/A 06/23/2018   Procedure: LOOP RECORDER INSERTION;  Surgeon: [name], MD;  Location: [facility];  Service: Cardiovascular;  Laterality: N/A;  . TEE WITHOUT CARDIOVERSION N/A 06/23/2018   Procedure: TRANSESOPHAGEAL ECHOCARDIOGRAM (TEE);  Surgeon: [name], MD;  Location: [facility] ENDOSCOPY;  Service: Cardiovascular;  Laterality: N/A;    SOCIAL HISTORY: Social History   Socioeconomic History  . Marital status: Widowed    Spouse name: Not on file  . Number of children:  Not on file  . Years of education: Not on file  . Highest education level: Not on file  Occupational History  . Not on file  Tobacco Use  . Smoking status: Never Smoker  . Smokeless tobacco: Never Used  Vaping Use  . Vaping Use: Never used  Substance and Sexual Activity  . Alcohol use: Not Currently  . Drug use: Not Currently  . Sexual activity: Not Currently  Other Topics  Concern  . Not on file  Social History Narrative  . Not on file   Social Determinants of Health   Financial Resource Strain:   . Difficulty of Paying Living Expenses:   Food Insecurity:   . Worried About Charity fundraiser in the Last Year:   . Arboriculturist in the Last Year:   Transportation Needs:   . Film/video editor (Medical):   Marland Kitchen Lack of Transportation (Non-Medical):   Physical Activity:   . Days of Exercise per Week:   . Minutes of Exercise per Session:   Stress:   . Feeling of Stress :   Social Connections:   . Frequency of Communication with Friends and Family:   . Frequency of Social Gatherings with Friends and Family:   . Attends Religious Services:   . Active Member of Clubs or Organizations:   . Attends Archivist Meetings:   Marland Kitchen Marital Status:   Intimate Partner Violence:   . Fear of Current or Ex-Partner:   . Emotionally Abused:   Marland Kitchen Physically Abused:   . Sexually Abused:     FAMILY HISTORY: Family History  Problem Relation Age of Onset  . Healthy Sister   . Healthy Brother     ALLERGIES:  has No Known Allergies.  MEDICATIONS:  Current Outpatient Medications  Medication Sig Dispense Refill  . aspirin EC 81 MG tablet Take 1 tablet (81 mg total) by mouth daily. Swallow whole. 90 tablet 3  . atorvastatin (LIPITOR) 40 MG tablet Take 1 tablet (40 mg total) by mouth daily at 6 PM. 90 tablet 3  . furosemide (LASIX) 20 MG tablet Take 1 tablet (20 mg total) by mouth every other day. 90 tablet 3  . Methylcellulose, Laxative, (FIBER THERAPY) 500 MG TABS Take by mouth daily.    . Polyethylene Glycol 3350 (MIRALAX PO) Take by mouth.    . potassium chloride SA (KLOR-CON) 20 MEQ tablet Take 2 tablets by mouth for 2 days then hold 30 tablet 0  . spironolactone (ALDACTONE) 25 MG tablet Take 0.5 tablets (12.5 mg total) by mouth daily. 45 tablet 3   No current facility-administered medications for this visit.    REVIEW OF SYSTEMS:     Constitutional: ( - ) fevers, ( - )  chills , ( - ) night sweats Eyes: ( - ) blurriness of vision, ( - ) double vision, ( - ) watery eyes Ears, nose, mouth, throat, and face: ( - ) mucositis, ( - ) sore throat Respiratory: ( - ) cough, ( - ) dyspnea, ( - ) wheezes Cardiovascular: ( - ) palpitation, ( - ) chest discomfort, ( - ) lower extremity swelling Gastrointestinal:  ( - ) nausea, ( - ) heartburn, ( - ) change in bowel habits Skin: ( - ) abnormal skin rashes Lymphatics: ( - ) new lymphadenopathy, ( - ) easy bruising Neurological: ( - ) numbness, ( - ) tingling, ( - ) new weaknesses Behavioral/Psych: ( - ) mood change, ( - ) new  changes  All other systems were reviewed with the patient and are negative.  PHYSICAL EXAMINATION: ECOG PERFORMANCE STATUS: 1 - Symptomatic but completely ambulatory  Vitals:   03/13/20 1439  BP: 103/61  Pulse: (!) 59  Resp: 18  Temp: 97.8 F (36.6 C)  SpO2: 100%   Filed Weights   03/13/20 1439  Weight: 117 lb (53.1 kg)    GENERAL: well appearing elderly [place] female in NAD  SKIN: skin color, texture, turgor are normal, no rashes or significant lesions EYES: conjunctiva are pink and non-injected, sclera clear LUNGS: clear to auscultation and percussion with normal breathing effort HEART: regular rate & rhythm and no murmurs and no lower extremity edema Musculoskeletal: no cyanosis of digits and no clubbing  PSYCH: alert & oriented x 3, fluent speech NEURO: no focal motor/sensory deficits  LABORATORY DATA:  I have reviewed the data as listed CBC Latest Ref Rng & Units 03/13/2020 02/21/2020 06/15/2019  WBC 4.0 - 10.5 K/uL 3.7(L) 3.0(L) 3.8  Hemoglobin 12.0 - 15.0 g/dL 11.4(L) 11.4 12.0  Hematocrit 36 - 46 % 34.1(L) 33.7(L) 36.5  Platelets 150 - 400 K/uL 190 129(L) 133(L)    CMP Latest Ref Rng & Units 03/13/2020 03/01/2020 02/21/2020  Glucose 70 - 99 mg/dL 98 114(H) 105(H)  BUN 8 - 23 mg/dL '20 21 22  '$ Creatinine 0.44 - 1.00 mg/dL 1.50(H)  1.68(H) 1.45(H)  Sodium 135 - 145 mmol/L 136 135 136  Potassium 3.5 - 5.1 mmol/L 4.3 3.2(L) 4.1  Chloride 98 - 111 mmol/L 102 102 100  CO2 22 - 32 mmol/L '24 23 23  '$ Calcium 8.9 - 10.3 mg/dL 9.5 9.0 8.8  Total Protein 6.5 - 8.1 g/dL 9.6(H) 8.7(H) 8.1  Total Bilirubin 0.3 - 1.2 mg/dL 2.5(H) 2.4(H) 2.6(H)  Alkaline Phos 38 - 126 U/L 252(H) 220(H) 242(H)  AST 15 - 41 U/L 70(H) 71(H) 49(H)  ALT 0 - 44 U/L '31 27 16    '$ RADIOGRAPHIC STUDIES:  DG Chest 2 View  Result Date: 02/22/2020 CLINICAL DATA:  Shortness of breath. EXAM: CHEST - 2 VIEW COMPARISON:  December 27, 2017. FINDINGS: Mild cardiomegaly is noted. No pneumothorax or pleural effusion is noted. No consolidative process is noted. Bony thorax is unremarkable. IMPRESSION: No active cardiopulmonary disease. Electronically Signed   By: Marijo Conception M.D.   On: 02/22/2020 11:15   DG Bone Survey Met  Result Date: 03/10/2020 CLINICAL DATA:  Evaluation for multiple myeloma. EXAM: METASTATIC BONE SURVEY COMPARISON:  Chest x-ray 02/21/2020.  CT 04/01/2019. FINDINGS: Standard metastatic bone survey obtained with imaging of the axial and appendicular skeleton. Diffuse severe osteopenia. Diffuse degenerative change noted about the axial and appendicular skeleton. Cardiac loop recorder noted. Cardiomegaly. No pulmonary venous congestion. Small density noted over the left lung base on left shoulder image, this clears on chest x-ray most likely represents small area of atelectasis. No focal bony abnormalities. IMPRESSION: 1. Diffuse severe osteopenia. Diffuse degenerative changes noted about the axial and appendicular skeleton. No focal bony lesions identified. 2. Cardiac loop recorder noted. Cardiomegaly. No pulmonary venous congestion. Electronically Signed   By: Marcello Moores  Register   On: 03/10/2020 09:18   CUP PACEART REMOTE DEVICE CHECK  Result Date: 03/03/2020 Carelink summary report received. Battery status OK. Normal device function. No new symptom  episodes, tachy episodes, brady, or pause episodes. No new AF episodes. Monthly summary reports and ROV/PRN  MYOCARDIAL AMYLOID IMAGING PLANAR AND SPECT  Result Date: 02/28/2020 Conclusion 3) Equivocal for TTR amyloidosis (Equivocal: A semi-quantitative visual score  of 1 or H/CL ratio 1-1.5) **If echo/CMR are strongly positive, and 8mcPYP negative, consider further evaluation including endomyocardial biopsy (Note: A negative or mildly positive PYP does not exclude AL amyloid. In addition, equivocal results could represent AL amyloid or early TTR amyloid) 99mchnetium-Pyrophosphate Imaging  for Transthyretin Cardiac Amyloidosis - [facility] Practice Points, S.Arvilla [place]. Al.    ASSESSMENT & PLAN Jaylan NgHakala79.o. female with medical history significant for CHF, CKD, HLD, CVA, and DM type II who presents for evaluation of a monoclonal gammopathy.  After review of the labs, discussion with the patient, and reviewed the prior records the patient's findings are concerning for a high risk monoclonal gammopathy and possible amyloidosis.  The patient has cardiac dysfunction with an equivocal myocardial amyloid imaging study performed on 02/28/2020.  Given these findings I do think would be reasonable to pursue an amyloidosis work-up.  The patient was found to have a monoclonal protein of 2.4 which automatically makes the patient high risk and would require a bone marrow biopsy for further evaluation.  We will have this bone marrow stained for Congo red, however if we are unable to see amyloid deposits from the bone marrow we would need to consider fat pad biopsy.  Alternative would be to consider liver biopsy given the elevation in LFTs which may represent amyloid of the liver, though it may also represent a congestive hepatopathy.  We will plan to have the bone marrow performed within the next week and intend to see the patient back when the results are available.  # Monoclonal Gammopathy with Concern for  Amyloidosis --with M protein >1.5, patient will require a bone marrow biopsy. We will have this scheduled ASAP with Congo Red staining to help r/o amyloidosis --if no amyloid is detected on bone marrow biopsy we can request a fat pad biopsy or biopsy of the affected tissue (cardiac tissue). As cardiac tissue is technically difficult to obtain, fat pad would be preferable.  --The patient's elevations in LFTs may also indicate amyloid involvement of the liver (or possibly congestive hepatopathy). --will complete the MGUS workup with SFLC, UPEP, and LDH -- given the patient's condition she is a candidate for systemic treatment if amyloidosis or plasma cell neoplasm is found --cardiac MR scheduled for 04/04/2020.  --RTC after the Bmbx to discuss the results, placeholder set for 3 weeks from now.   Orders Placed This Encounter  Procedures  . CBC with Differential (Cancer Center Only)    Standing Status:   Future    Number of Occurrences:   1    Standing Expiration Date:   03/13/2021  . CMP ([facility])    Standing Status:   Future    Number of Occurrences:   1    Standing Expiration Date:   03/13/2021  . Lactate dehydrogenase (LDH)    Standing Status:   Future    Number of Occurrences:   1    Standing Expiration Date:   03/13/2021  . Multiple Myeloma Panel (SPEP&IFE w/QIG)    Standing Status:   Future    Number of Occurrences:   1    Standing Expiration Date:   03/13/2021  . Kappa/lambda light chains    Standing Status:   Future    Number of Occurrences:   1    Standing Expiration Date:   03/13/2021  . Beta 2 microglobulin    Standing Status:   Future    Number of Occurrences:   1  Standing Expiration Date:   03/13/2021  . 24-Hr Ur UPEP/UIFE/Light Chains/TP    Standing Status:   Future    Standing Expiration Date:   03/13/2021    All questions were answered. The patient knows to call the clinic with any problems, questions or concerns.  A total of more than 60 minutes were spent  on this encounter and over half of that time was spent on counseling and coordination of care as outlined above.   Ledell Peoples, MD Department of Hematology/Oncology [place] at [facility] Phone: [phone] Pager: [phone] Email: [email or web address]  03/13/2020 4:59 PM

## 2020-03-13 ENCOUNTER — Other Ambulatory Visit: Payer: Self-pay

## 2020-03-13 ENCOUNTER — Encounter: Payer: Self-pay | Admitting: Hematology and Oncology

## 2020-03-13 ENCOUNTER — Inpatient Hospital Stay: Payer: Medicaid Other | Attending: Hematology and Oncology | Admitting: Hematology and Oncology

## 2020-03-13 ENCOUNTER — Inpatient Hospital Stay: Payer: Medicaid Other

## 2020-03-13 VITALS — BP 103/61 | HR 59 | Temp 97.8°F | Resp 18 | Ht 65.0 in | Wt 117.0 lb

## 2020-03-13 DIAGNOSIS — Z9049 Acquired absence of other specified parts of digestive tract: Secondary | ICD-10-CM | POA: Diagnosis not present

## 2020-03-13 DIAGNOSIS — I5042 Chronic combined systolic (congestive) and diastolic (congestive) heart failure: Secondary | ICD-10-CM

## 2020-03-13 DIAGNOSIS — E785 Hyperlipidemia, unspecified: Secondary | ICD-10-CM | POA: Insufficient documentation

## 2020-03-13 DIAGNOSIS — Z79899 Other long term (current) drug therapy: Secondary | ICD-10-CM | POA: Insufficient documentation

## 2020-03-13 DIAGNOSIS — E1122 Type 2 diabetes mellitus with diabetic chronic kidney disease: Secondary | ICD-10-CM | POA: Insufficient documentation

## 2020-03-13 DIAGNOSIS — Z8673 Personal history of transient ischemic attack (TIA), and cerebral infarction without residual deficits: Secondary | ICD-10-CM | POA: Insufficient documentation

## 2020-03-13 DIAGNOSIS — I429 Cardiomyopathy, unspecified: Secondary | ICD-10-CM

## 2020-03-13 DIAGNOSIS — N1831 Chronic kidney disease, stage 3a: Secondary | ICD-10-CM

## 2020-03-13 DIAGNOSIS — Z7982 Long term (current) use of aspirin: Secondary | ICD-10-CM | POA: Diagnosis not present

## 2020-03-13 DIAGNOSIS — D472 Monoclonal gammopathy: Secondary | ICD-10-CM

## 2020-03-13 LAB — CBC WITH DIFFERENTIAL (CANCER CENTER ONLY)
Abs Immature Granulocytes: 0.02 10*3/uL (ref 0.00–0.07)
Basophils Absolute: 0 10*3/uL (ref 0.0–0.1)
Basophils Relative: 1 %
Eosinophils Absolute: 0.2 10*3/uL (ref 0.0–0.5)
Eosinophils Relative: 5 %
HCT: 34.1 % — ABNORMAL LOW (ref 36.0–46.0)
Hemoglobin: 11.4 g/dL — ABNORMAL LOW (ref 12.0–15.0)
Immature Granulocytes: 1 %
Lymphocytes Relative: 17 %
Lymphs Abs: 0.6 10*3/uL — ABNORMAL LOW (ref 0.7–4.0)
MCH: 35.1 pg — ABNORMAL HIGH (ref 26.0–34.0)
MCHC: 33.4 g/dL (ref 30.0–36.0)
MCV: 104.9 fL — ABNORMAL HIGH (ref 80.0–100.0)
Monocytes Absolute: 0.7 10*3/uL (ref 0.1–1.0)
Monocytes Relative: 18 %
Neutro Abs: 2.2 10*3/uL (ref 1.7–7.7)
Neutrophils Relative %: 58 %
Platelet Count: 190 10*3/uL (ref 150–400)
RBC: 3.25 MIL/uL — ABNORMAL LOW (ref 3.87–5.11)
RDW: 13.7 % (ref 11.5–15.5)
WBC Count: 3.7 10*3/uL — ABNORMAL LOW (ref 4.0–10.5)
nRBC: 0 % (ref 0.0–0.2)

## 2020-03-13 LAB — CMP (CANCER CENTER ONLY)
ALT: 31 U/L (ref 0–44)
AST: 70 U/L — ABNORMAL HIGH (ref 15–41)
Albumin: 3.6 g/dL (ref 3.5–5.0)
Alkaline Phosphatase: 252 U/L — ABNORMAL HIGH (ref 38–126)
Anion gap: 10 (ref 5–15)
BUN: 20 mg/dL (ref 8–23)
CO2: 24 mmol/L (ref 22–32)
Calcium: 9.5 mg/dL (ref 8.9–10.3)
Chloride: 102 mmol/L (ref 98–111)
Creatinine: 1.5 mg/dL — ABNORMAL HIGH (ref 0.44–1.00)
GFR, Est AFR Am: 39 mL/min — ABNORMAL LOW (ref >60)
GFR, Estimated: 33 mL/min — ABNORMAL LOW (ref >60)
Glucose, Bld: 98 mg/dL (ref 70–99)
Potassium: 4.3 mmol/L (ref 3.5–5.1)
Sodium: 136 mmol/L (ref 135–145)
Total Bilirubin: 2.5 mg/dL — ABNORMAL HIGH (ref 0.3–1.2)
Total Protein: 9.6 g/dL — ABNORMAL HIGH (ref 6.5–8.1)

## 2020-03-13 LAB — LACTATE DEHYDROGENASE: LDH: 384 U/L — ABNORMAL HIGH (ref 98–192)

## 2020-03-14 ENCOUNTER — Telehealth: Payer: Self-pay | Admitting: Hematology and Oncology

## 2020-03-14 LAB — KAPPA/LAMBDA LIGHT CHAINS
Kappa free light chain: 23.8 mg/L — ABNORMAL HIGH (ref 3.3–19.4)
Kappa, lambda light chain ratio: 0.69 (ref 0.26–1.65)
Lambda free light chains: 34.5 mg/L — ABNORMAL HIGH (ref 5.7–26.3)

## 2020-03-14 NOTE — Telephone Encounter (Signed)
Scheduled per 7/12. Unable to reach pt. Left voicemail with appt time and date.

## 2020-03-15 ENCOUNTER — Telehealth: Payer: Self-pay

## 2020-03-15 LAB — MULTIPLE MYELOMA PANEL, SERUM
Albumin SerPl Elph-Mcnc: 3.9 g/dL (ref 2.9–4.4)
Albumin/Glob SerPl: 0.8 (ref 0.7–1.7)
Alpha 1: 0.3 g/dL (ref 0.0–0.4)
Alpha2 Glob SerPl Elph-Mcnc: 0.7 g/dL (ref 0.4–1.0)
B-Globulin SerPl Elph-Mcnc: 0.9 g/dL (ref 0.7–1.3)
Gamma Glob SerPl Elph-Mcnc: 3 g/dL — ABNORMAL HIGH (ref 0.4–1.8)
Globulin, Total: 4.9 g/dL — ABNORMAL HIGH (ref 2.2–3.9)
IgA: 62 mg/dL — ABNORMAL LOW (ref 64–422)
IgG (Immunoglobin G), Serum: 3729 mg/dL — ABNORMAL HIGH (ref 586–1602)
IgM (Immunoglobulin M), Srm: 68 mg/dL (ref 26–217)
M Protein SerPl Elph-Mcnc: 2.7 g/dL — ABNORMAL HIGH
Total Protein ELP: 8.8 g/dL — ABNORMAL HIGH (ref 6.0–8.5)

## 2020-03-15 LAB — BETA 2 MICROGLOBULIN, SERUM: Beta-2 Microglobulin: 5.4 mg/L — ABNORMAL HIGH (ref 0.6–2.4)

## 2020-03-15 NOTE — Telephone Encounter (Signed)
Called pt's grand daughter to dicuss BMBX per Dr Lorenso Courier request. Pt's grand daughter understands pt should arrive for [facility] 7/22 at 0730. Message was sent to scheduling to call pt to set up appt for BMBX and labs following. Also spoke with Estill Bamberg in flowtometry regarding BX.

## 2020-03-15 NOTE — Telephone Encounter (Signed)
-----  Message from Gardenia Phlegm, NP sent at 03/14/2020 12:20 PM EDT -----  ----- Message ----- From: Orson Slick, MD Sent: 03/14/2020  12:02 PM EDT To: Gardenia Phlegm, NP  Mendel Ryder,  I have a very pleasant patient by the name of Mikayla Reynolds who is in need of a bone marrow biopsy due to concern for MM vs amyloidosis. She has pretty marked osteopenia, so it should not be a difficult biopsy. Do you have any availability in the next week or so?  Best,  Ulice Dash

## 2020-03-20 ENCOUNTER — Other Ambulatory Visit: Payer: Self-pay

## 2020-03-20 ENCOUNTER — Encounter: Payer: Self-pay | Admitting: Adult Health

## 2020-03-20 ENCOUNTER — Ambulatory Visit: Payer: Medicaid Other | Admitting: Adult Health

## 2020-03-20 VITALS — BP 111/68 | HR 54 | Ht 60.0 in | Wt 119.0 lb

## 2020-03-20 DIAGNOSIS — I639 Cerebral infarction, unspecified: Secondary | ICD-10-CM

## 2020-03-20 DIAGNOSIS — E785 Hyperlipidemia, unspecified: Secondary | ICD-10-CM

## 2020-03-20 DIAGNOSIS — I10 Essential (primary) hypertension: Secondary | ICD-10-CM

## 2020-03-20 NOTE — Patient Instructions (Signed)
Continue aspirin 81 mg daily  and atorvastatin  for secondary stroke prevention  Continue to follow up with PCP regarding cholesterol management and ensure follow up in the near future for repeat lipid panel  Loop recorder will continued to be monitored for possible atrial fibrillation  Continue to follow with oncology/hematology for amyloidosis work up  Maintain strict control of hypertension with blood pressure goal below 130/90, diabetes with hemoglobin A1c goal below 6.5% and cholesterol with LDL cholesterol (bad cholesterol) goal below 70 mg/dL. I also advised the patient to eat a healthy diet with plenty of whole grains, cereals, fruits and vegetables, exercise regularly and maintain ideal body weight.  Followup in the future with me in 6 months or call earlier if needed       Thank you for coming to see [place] at [facility]. I hope we have been able to provide you high quality care today.  You may receive a patient satisfaction survey over the next few weeks. We would appreciate your feedback and comments so that we may continue to improve ourselves and the health of our patients.

## 2020-03-20 NOTE — Progress Notes (Signed)
[facility] [address] [place] [address] [phone]       OFFICE FOLLOW UP VISIT  Mikayla Reynolds Date of Birth:  03-Aug-1941 Medical Record Number:  [id number]   Referring MD:  Merlene Laughter  Reason for Referral:  Stroke    Chief complaint: Chief Complaint  Patient presents with  . Follow-up    rm 9 here for a stroke f/u. Pt is not having any new sx. Interpreter is in the rm    HPI:   Today, 03/20/2020, Mikayla Reynolds returns for stroke follow-up accompanied by son and interpreter.  Residual stroke deficits of the left hand weakness and numbness which have been stable without worsening.  Denies new stroke/TIA symptoms. Remains on aspirin and atorvastatin for secondary stroke prevention without side effects.  Blood pressure today 111/68.  Loop recorder is not shown atrial fibrillation thus far. 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  She does complain of subjective generalized weakness and slower gait which was also discussed at prior visit.  Also follows closely cardiology and PCP.  No concerns at this time.    History provided for reference purposes only Update 09/23/2019 JM: Mikayla Reynolds is a 79 year old female who is being seen today for stroke follow-up accompianed by interpreter.  She has not been seen in over the past year due to rescheduling of visits.  Residual stroke deficits left hand weakness, decreased fine motor skills and numbness.  She had for stated that she has experienced worsening left hand weakness which has been slowly worsening over the past 3 to 4 months as well as weakness in legs bilaterally.  She feels as though her functional ability has been worsening each day and has greater difficulty with ambulation.  She denies pain such as arthritis but does state mobility improves after ambulating for longer duration.  She does use cane  for assistance due to fear of falling but denies any recent falls.  She continues to live with her son and daughter in law but is able to maintain ADLs without difficulty.  She does endorse sedentary lifestyle with little exercise or activity as well as having been seen by multiple providers due to health conditions.  She also endorses lack of nutrition due to poor appetite and typically will only eat 2 meals per day or will replace a meal with Ensure.  She also endorses weight loss and feels as though she is more frail.  Denies any memory or cognition concerns besides normal age-related short-term memory loss, any additional weakness, dizziness, imbalance or visual changes.  After further discussion, it was determined as though left hand has not actually worsened since her stroke but has not improved.  Continues on aspirin and atorvastatin for secondary stroke prevention without side effects.  Blood pressure today stable at 126/80.  She did undergo TEE with loop recorder placement on 08/30/2018.  TEE showed EF of 50 to 55% without evidence of cardiac source of embolus or PFO.  Loop recorder has not shown atrial fibrillation thus far.  She continues to follow with cardiology regularly for ongoing monitoring and management of chronic combined CHF.  No further concerns at this time.  Update 06/22/18 Dr. Leonie Man : She returns for follow-up after last visit 3 months ago.  She is accompanied by granddaughter and [place] language interpreter who was present throughout this visit.  Patient continues to do well.  She has had  no recurrent stroke or TIA symptoms.  She still complains of left hand weakness and numbness and diminished fine motor skills.  She does exercise a left hand regularly.  She is tolerating aspirin well without bruising or bleeding.  She remains on Lipitor and is tolerating it well.  She has no new complaints.  The patient was scheduled to undergo TEE and loop recorder but that has not yet happened and  is scheduled for later this week.  Initial visit 03/17/18 Dr. Leonie Man : Mikayla Reynolds is a pleasant 79 year old [place] lady who seen today for first office follow-up visit for hospital admission for stroke in April 2019. She is a accompanied by her granddaughter who translates for her as patient speaks no [place]. History is obtained from them as well as review of electronic medical records. I have personally reviewed imaging for lemons in hospital stroke workup.HPI:( Dr Lorraine Lax)  Mikayla Reynolds is an 79 y.o. [place] female with a past medical history of 2 previous TIAs, hypertension who presents to the ED with a 2-day complaint of left sided hemiparesis, left paresthesia, dysphagia, dysarthria left facial droop and impaired mobility.  Patient is [place] speaking and she refused for iPad [place] interpreter, and opted for granddaughter at the bedside to interpret for her.Per family, patient's son last saw her at 1 PM on Wednesday, 12/25/2017, he went out and returned around 8 PM.  At that time patient was confused, was speaking gibberish, with slurred speech, left-sided facial droop, unsteady gait but was still walking around as well as left sided weakness.  At baseline patient is left-handed, when she was eating dinner she was unable to hold the spoon in her left hand and had drooling of the fluid out of the left side of her mouth with some difficulty swallowing.  Patient refused to come to the ER per family but they just had to bring her in today due to the persistency of her symptoms. Per patient through interpretation by her granddaughter, she had a headache starting late Thursday, which continues to be persistent up until now, difficulty sleeping, blurred vision which has since resolved, denies trauma or falls. Per family patient has had 2 previous TIAs, first 1 in [place] 2 years ago with the exact same symptoms which resolved spontaneously, second one was about 9 months ago with the exact same symptoms,  patient refused to go to the hospital at that time and by the time she had woken up the next morning symptoms had resolved. On admission to the ER patient is very compliant, does answer questions appropriately, per family continues to have slurred speech, but more lucid with more coherent speech.  Initial CT of the head done in the ER showed acute right MCA distribution infarct.  Neuro was consulted for further evaluation.  Patient denies dizziness, nausea, vomiting she admits to intermittent chest pain and family says has some anxiety.  Troponin on admission was elevated at 0.1, EKG was normal sinus rhythm no ST-t wave changes consistent with ischemia, chest x-ray pending, normal electrolytes within normal limits. 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 MRI scan confirmed a right frontal MCA branch infarct. Carotid ultrasound was not done. LDL cholesterol was elevated 120 mg percent. In women A1c was 6. Transthoracic echo showed normal ejection fraction. Telemetric cardiac monitoring did not reveal any paroxysmal A. fib. Patient was started on aspirin  for stroke prevention. Physical occupational therapy were consulted. Patient did well and was discharged home. She has finished home therapies. Her speech and swallowing have improved. She still has some diminished fine motor skills in the left hand. She still drops objects from hand. She is tolerating aspirin well without bleeding or bruising. She is also on Plavix. Transesophageal echocardiogram and prolonged cardiac monitoring for some reason the not ordered. Patient has no new complaints.     ROS:   14 system review of systems is positive for weakness, gait impairment and all systems negative  PMH:  Past Medical History:  Diagnosis Date  . Abnormal liver function   . Anemia, unspecified   . Chronic combined  systolic and diastolic CHF (congestive heart failure) ([place])   . CKD (chronic kidney disease), stage III 02/19/2018  . Elevated troponin 12/27/2017  . Hyperlipidemia   . Junctional bradycardia   . Mitral regurgitation   . Pre-diabetes   . Renal mass   . Stroke [facility])     Social History:  Social History   Socioeconomic History  . Marital status: Widowed    Spouse name: Not on file  . Number of children: Not on file  . Years of education: Not on file  . Highest education level: Not on file  Occupational History  . Not on file  Tobacco Use  . Smoking status: Never Smoker  . Smokeless tobacco: Never Used  Vaping Use  . Vaping Use: Never used  Substance and Sexual Activity  . Alcohol use: Not Currently  . Drug use: Not Currently  . Sexual activity: Not Currently  Other Topics Concern  . Not on file  Social History Narrative  . Not on file   Social Determinants of Health   Financial Resource Strain:   . Difficulty of Paying Living Expenses:   Food Insecurity:   . Worried About Charity fundraiser in the Last Year:   . Arboriculturist in the Last Year:   Transportation Needs:   . Film/video editor (Medical):   Marland Kitchen Lack of Transportation (Non-Medical):   Physical Activity:   . Days of Exercise per Week:   . Minutes of Exercise per Session:   Stress:   . Feeling of Stress :   Social Connections:   . Frequency of Communication with Friends and Family:   . Frequency of Social Gatherings with Friends and Family:   . Attends Religious Services:   . Active Member of Clubs or Organizations:   . Attends Archivist Meetings:   Marland Kitchen Marital Status:   Intimate Partner Violence:   . Fear of Current or Ex-Partner:   . Emotionally Abused:   Marland Kitchen Physically Abused:   . Sexually Abused:     Medications:   Current Outpatient Medications on File Prior to Visit  Medication Sig Dispense Refill  . aspirin EC 81 MG tablet Take 1 tablet (81 mg total) by mouth daily. Swallow  whole. 90 tablet 3  . atorvastatin (LIPITOR) 40 MG tablet Take 1 tablet (40 mg total) by mouth daily at 6 PM. 90 tablet 3  . furosemide (LASIX) 20 MG tablet Take 1 tablet (20 mg total) by mouth every other day. 90 tablet 3  . Methylcellulose, Laxative, (FIBER THERAPY) 500 MG TABS Take by mouth daily.    . Polyethylene Glycol 3350 (MIRALAX PO) Take by mouth.    . potassium chloride SA (KLOR-CON) 20 MEQ tablet Take 2 tablets by mouth for 2 days  then hold 30 tablet 0  . spironolactone (ALDACTONE) 25 MG tablet Take 0.5 tablets (12.5 mg total) by mouth daily. 45 tablet 3   No current facility-administered medications on file prior to visit.    Allergies:  No Known Allergies  Physical Exam  Today's Vitals   03/20/20 1039  BP: 111/68  Pulse: (!) 54  Weight: 119 lb (54 kg)  Height: 5' (1.524 m)   Body mass index is 23.24 kg/m.  General: Frail pleasant elderly Asian lady seated, in no evident distress Head: head normocephalic and atraumatic.   Neck: supple with no carotid or supraclavicular bruits Cardiovascular: regular rate and rhythm, no murmurs Musculoskeletal: no deformity Skin:  no rash/petichiae Vascular:  Normal pulses all extremities  Neurologic Exam Mental Status: Awake and fully alert.  [place] speaking only.  Oriented to place and time. Recent subjectively mildly impaired and remote memory intact per son. Attention span, concentration and fund of knowledge appropriate during visit. Mood and affect appropriate.  Cranial Nerves: Pupils equal, briskly reactive to light. Extraocular movements full without nystagmus. Visual fields full to confrontation. Hearing intact. Facial sensation intact. Mild left lower facial asymmetry., tongue, palate moves normally and symmetrically.  Motor: Normal bulk and tone. Normal strength in all tested extremity muscles except Mild weakness of left grip and intrinsic hand muscles Sensory.: intact to touch , pinprick , position and vibratory  sensation.  Coordination: Rapid alternating movements normal in all extremities except decreased left hand. Finger-to-nose and heel-to-shin performed accurately bilaterally. Gait and Station: Arises from chair without difficulty. Stance is slightly hunched. Gait demonstrates slow cautious steps with use of cane.  Tandem walk not attempted. Reflexes: 1+ and symmetric. Toes downgoing.      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 Vascular risk factors of hyperlipidemia and chronic systolic HF.  Currently being evaluated by oncology/hematology for monoclonal gammopathy with concern for amyloidosis vs multiple myeloma with plans on bone marrow biopsy this week    1.  Right MCA stroke  -Residual deficits mild left hand weakness stable  -Continue aspirin 81 mg daily and atorvastatin 40 mg daily for secondary stroke prevention   -Loop recorder: Negative for atrial fibrillation thus far.  We will continue to monitor for possible atrial fibrillation  -Close follow-up with PCP for aggressive stroke risk factor management as well as routine follow-up by cardiology for CHF  2.  HLD  -LDL goal<70  -Continue atorvastatin 40 mg daily and follow-up with PCP for prescribing, monitoring and management    Follow-up in 6 months or call earlier if needed  I spent 20 minutes of face-to-face and non-face-to-face time with patient and son assisted by interpreter.  This included previsit chart review, lab review, study review, order entry, electronic health record documentation, patient education regarding history of stroke, importance of managing stroke risk factors, evaluation for possible amyloidosis and answered all questions to patient and son satisfaction   Frann Rider,  AGNP-BC  [facility] [address] [place], [place] [address]  Phone [phone] Fax [phone] Note: This document was prepared with digital dictation and possible smart phrase technology. Any transcriptional errors that result from this process are unintentional.

## 2020-03-23 ENCOUNTER — Other Ambulatory Visit: Payer: Self-pay | Admitting: *Deleted

## 2020-03-23 ENCOUNTER — Other Ambulatory Visit: Payer: Self-pay

## 2020-03-23 ENCOUNTER — Inpatient Hospital Stay: Payer: Medicaid Other

## 2020-03-23 ENCOUNTER — Inpatient Hospital Stay (HOSPITAL_BASED_OUTPATIENT_CLINIC_OR_DEPARTMENT_OTHER): Payer: Medicaid Other | Admitting: Adult Health

## 2020-03-23 DIAGNOSIS — Z8673 Personal history of transient ischemic attack (TIA), and cerebral infarction without residual deficits: Secondary | ICD-10-CM | POA: Diagnosis not present

## 2020-03-23 DIAGNOSIS — Z7982 Long term (current) use of aspirin: Secondary | ICD-10-CM | POA: Diagnosis not present

## 2020-03-23 DIAGNOSIS — I5042 Chronic combined systolic (congestive) and diastolic (congestive) heart failure: Secondary | ICD-10-CM | POA: Diagnosis not present

## 2020-03-23 DIAGNOSIS — E785 Hyperlipidemia, unspecified: Secondary | ICD-10-CM | POA: Diagnosis not present

## 2020-03-23 DIAGNOSIS — D472 Monoclonal gammopathy: Secondary | ICD-10-CM | POA: Diagnosis not present

## 2020-03-23 DIAGNOSIS — Z9049 Acquired absence of other specified parts of digestive tract: Secondary | ICD-10-CM | POA: Diagnosis not present

## 2020-03-23 DIAGNOSIS — Z79899 Other long term (current) drug therapy: Secondary | ICD-10-CM | POA: Diagnosis not present

## 2020-03-23 DIAGNOSIS — E1122 Type 2 diabetes mellitus with diabetic chronic kidney disease: Secondary | ICD-10-CM | POA: Diagnosis not present

## 2020-03-23 LAB — CBC WITH DIFFERENTIAL (CANCER CENTER ONLY)
Abs Immature Granulocytes: 0.02 10*3/uL (ref 0.00–0.07)
Basophils Absolute: 0.1 10*3/uL (ref 0.0–0.1)
Basophils Relative: 1 %
Eosinophils Absolute: 0.2 10*3/uL (ref 0.0–0.5)
Eosinophils Relative: 4 %
HCT: 33.1 % — ABNORMAL LOW (ref 36.0–46.0)
Hemoglobin: 11 g/dL — ABNORMAL LOW (ref 12.0–15.0)
Immature Granulocytes: 1 %
Lymphocytes Relative: 19 %
Lymphs Abs: 0.7 10*3/uL (ref 0.7–4.0)
MCH: 35 pg — ABNORMAL HIGH (ref 26.0–34.0)
MCHC: 33.2 g/dL (ref 30.0–36.0)
MCV: 105.4 fL — ABNORMAL HIGH (ref 80.0–100.0)
Monocytes Absolute: 0.7 10*3/uL (ref 0.1–1.0)
Monocytes Relative: 19 %
Neutro Abs: 2 10*3/uL (ref 1.7–7.7)
Neutrophils Relative %: 56 %
Platelet Count: 139 10*3/uL — ABNORMAL LOW (ref 150–400)
RBC: 3.14 MIL/uL — ABNORMAL LOW (ref 3.87–5.11)
RDW: 14.6 % (ref 11.5–15.5)
WBC Count: 3.5 10*3/uL — ABNORMAL LOW (ref 4.0–10.5)
nRBC: 0 % (ref 0.0–0.2)

## 2020-03-23 NOTE — Progress Notes (Signed)
Procedure site checked prior to d/c.  Dressing clean, dry, and intact.  VSS.  Pt feels well.  Discharge instructions given.

## 2020-03-23 NOTE — Patient Instructions (Signed)
Bone Marrow Aspiration and Bone Marrow Biopsy, Adult, Care After This sheet gives you information about how to care for yourself after your procedure. Your health care provider may also give you more specific instructions. If you have problems or questions, contact your health care provider. What can I expect after the procedure? After the procedure, it is common to have:  Mild pain and tenderness.  Swelling.  Bruising. Follow these instructions at home: Puncture site care   Follow instructions from your health care provider about how to take care of the puncture site. Make sure you: ? Wash your hands with soap and water before and after you change your bandage (dressing). If soap and water are not available, use hand sanitizer. ? Change your dressing as told by your health care provider.  Check your puncture site every day for signs of infection. Check for: ? More redness, swelling, or pain. ? Fluid or blood. ? Warmth. ? Pus or a bad smell. Activity  Return to your normal activities as told by your health care provider. Ask your health care provider what activities are safe for you.  Do not lift anything that is heavier than 10 lb (4.5 kg), or the limit that you are told, until your health care provider says that it is safe.  Do not drive for 24 hours if you were given a sedative during your procedure. General instructions   Take over-the-counter and prescription medicines only as told by your health care provider.  Do not take baths, swim, or use a hot tub until your health care provider approves. Ask your health care provider if you may take showers. You may only be allowed to take sponge baths.  If directed, put ice on the affected area. To do this: ? Put ice in a plastic bag. ? Place a towel between your skin and the bag. ? Leave the ice on for 20 minutes, 2-3 times a day.  Keep all follow-up visits as told by your health care provider. This is important. Contact a  health care provider if:  Your pain is not controlled with medicine.  You have a fever.  You have more redness, swelling, or pain around the puncture site.  You have fluid or blood coming from the puncture site.  Your puncture site feels warm to the touch.  You have pus or a bad smell coming from the puncture site. Summary  After the procedure, it is common to have mild pain, tenderness, swelling, and bruising.  Follow instructions from your health care provider about how to take care of the puncture site and what activities are safe for you.  Take over-the-counter and prescription medicines only as told by your health care provider.  Contact a health care provider if you have any signs of infection, such as fluid or blood coming from the puncture site. This information is not intended to replace advice given to you by your health care provider. Make sure you discuss any questions you have with your health care provider. Document Revised: 01/05/2019 Document Reviewed: 01/05/2019 Elsevier Patient Education  2020 Elsevier Inc.  

## 2020-03-23 NOTE — Progress Notes (Signed)
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 ENT: adequate airway clearance Heart: regular rate and rhythm.No Murmurs Lungs: clear to auscultation, no wheezes, normal respiratory effort  Bone Marrow Biopsy and Aspiration Procedure Note   Informed consent was obtained and potential risks including bleeding, infection and pain were reviewed with the patient.  The patient's name, date of birth, identification, consent and allergies were verified prior to the start of procedure and time out was performed.  The right posterior iliac crest was chosen as the site of biopsy.  The skin was prepped with ChloraPrep.   8 cc of 2% lidocaine was used to provide local anaesthesia.   10 cc of bone marrow aspirate was obtained followed by 1cm biopsy.  Pressure was applied to the biopsy site and bandage was placed over the biopsy site. Patient was made to lie on the back for 15 mins prior to discharge.  The procedure was tolerated well. COMPLICATIONS: None BLOOD LOSS: none The patient was discharged home in stable condition and Dr. Lorenso Courier will see her on 8/2 to review results.  Patient was provided with post bone marrow biopsy instructions and instructed to call if there was any bleeding or worsening pain.  Specimens sent for flow cytometry, cytogenetics and additional studies.  Signed Scot Dock, NP

## 2020-03-24 ENCOUNTER — Telehealth: Payer: Self-pay | Admitting: Adult Health

## 2020-03-24 NOTE — Telephone Encounter (Signed)
No 7/22 los. No changes made to pt's schedule.  

## 2020-03-27 ENCOUNTER — Ambulatory Visit: Payer: Medicaid Other | Attending: Internal Medicine | Admitting: Internal Medicine

## 2020-03-27 ENCOUNTER — Other Ambulatory Visit: Payer: Self-pay

## 2020-03-27 VITALS — BP 117/76 | HR 54 | Resp 16 | Wt 123.4 lb

## 2020-03-27 DIAGNOSIS — Z79899 Other long term (current) drug therapy: Secondary | ICD-10-CM | POA: Diagnosis not present

## 2020-03-27 DIAGNOSIS — I429 Cardiomyopathy, unspecified: Secondary | ICD-10-CM | POA: Diagnosis not present

## 2020-03-27 DIAGNOSIS — F320 Major depressive disorder, single episode, mild: Secondary | ICD-10-CM

## 2020-03-27 DIAGNOSIS — I2510 Atherosclerotic heart disease of native coronary artery without angina pectoris: Secondary | ICD-10-CM | POA: Insufficient documentation

## 2020-03-27 DIAGNOSIS — I5042 Chronic combined systolic (congestive) and diastolic (congestive) heart failure: Secondary | ICD-10-CM | POA: Diagnosis not present

## 2020-03-27 DIAGNOSIS — D472 Monoclonal gammopathy: Secondary | ICD-10-CM

## 2020-03-27 DIAGNOSIS — N183 Chronic kidney disease, stage 3 unspecified: Secondary | ICD-10-CM | POA: Diagnosis not present

## 2020-03-27 DIAGNOSIS — I130 Hypertensive heart and chronic kidney disease with heart failure and stage 1 through stage 4 chronic kidney disease, or unspecified chronic kidney disease: Secondary | ICD-10-CM | POA: Insufficient documentation

## 2020-03-27 DIAGNOSIS — R079 Chest pain, unspecified: Secondary | ICD-10-CM

## 2020-03-27 DIAGNOSIS — F329 Major depressive disorder, single episode, unspecified: Secondary | ICD-10-CM | POA: Diagnosis not present

## 2020-03-27 DIAGNOSIS — E1122 Type 2 diabetes mellitus with diabetic chronic kidney disease: Secondary | ICD-10-CM | POA: Diagnosis not present

## 2020-03-27 DIAGNOSIS — Z7982 Long term (current) use of aspirin: Secondary | ICD-10-CM | POA: Diagnosis not present

## 2020-03-27 DIAGNOSIS — R531 Weakness: Secondary | ICD-10-CM | POA: Insufficient documentation

## 2020-03-27 DIAGNOSIS — N2889 Other specified disorders of kidney and ureter: Secondary | ICD-10-CM | POA: Diagnosis not present

## 2020-03-27 DIAGNOSIS — F32A Depression, unspecified: Secondary | ICD-10-CM

## 2020-03-27 LAB — UPEP/UIFE/LIGHT CHAINS/TP, 24-HR UR
% BETA, Urine: 27.4 %
ALPHA 1 URINE: 5.6 %
Albumin, U: 18.8 %
Alpha 2, Urine: 15.1 %
Free Kappa Lt Chains,Ur: 115.13 mg/L — ABNORMAL HIGH (ref 0.63–113.79)
Free Kappa/Lambda Ratio: 7.27 (ref 1.03–31.76)
Free Lambda Lt Chains,Ur: 15.84 mg/L — ABNORMAL HIGH (ref 0.47–11.77)
GAMMA GLOBULIN URINE: 33 %
Total Protein, Urine-Ur/day: 98 mg/24 hr (ref 30–150)
Total Protein, Urine: 11.5 mg/dL
Total Volume: 850

## 2020-03-27 NOTE — Patient Instructions (Signed)
Please be seen in the emergency room if you have any further chest pains today.

## 2020-03-27 NOTE — Progress Notes (Signed)
Patient ID: Mikayla Reynolds, female    DOB: [birth year]  MRN: [id number]  CC: Hypertension   Subjective: Mikayla Reynolds is a 79 y.o. female who presents for chronic ds management.  GD Laria Grimmett, is with her.  Her concerns today include:  HTN, CVART MCAloop recorder in place, pre-DM, CKD stage 3, comb sys/dias CHF (EF 25-30%), RT kidney mass followed by [facility], hepatomegaly and abnormal LFTs (seen by Dr. Loletha Carrow 2020).  CHF:  Dr. Haroldine Laws has done some work up.  Found to have a MG. 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  Dr. Lorenso Courier saw her earlier this mth and did bone marrow bx last wk results pending -no SOB.  No lower extremity edema -c/o pressure like CP in the center of chest this a.m that lasted about 1 hr but subsequently resolved.  There was no radiation.    She had a CAT scan through urology back in February of this year.  Incidental finding was coronary artery atherosclerosis and aortic atherosclerosis.  RT kidney mass: Had seen the urologist back in February of this year.  CT of the abdomen revealed significant decrease in the size of complex cystic lesion in the right kidney.  Interval decrease in size suggestive of cysts or cyst complicated by infection.  Plan is for surveillance with renal ultrasound in 1 year.  Positive Depression screen today:  Feels depressed due to her health.  No SI.  Poor appetite.  "Nothing taste good." -sleeping good.  No crying spells.  She is independent in her ADLs. No falls in past 6 mths.  Ambulates with a cane.  Feels weak in her legs.  Would like prescription to get a shower chair/bench Patient Active Problem List   Diagnosis Date Noted  . 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  . Chronic combined systolic and diastolic CHF (congestive heart failure) ([place]) 06/07/2019  . Cardiomyopathy ([place]) 04/09/2019  . Right kidney mass 04/09/2019  . History of loop recorder 03/11/2019  . Macrocytic anemia 03/11/2019  . CKD (chronic  kidney disease), stage III 02/19/2018  . Stroke ([place]) 12/27/2017  . Elevated troponin 12/27/2017  . Chest pain 12/27/2017  . Essential hypertension      Current Outpatient Medications on File Prior to Visit  Medication Sig Dispense Refill  . aspirin EC 81 MG tablet Take 1 tablet (81 mg total) by mouth daily. Swallow whole. 90 tablet 3  . atorvastatin (LIPITOR) 40 MG tablet Take 1 tablet (40 mg total) by mouth daily at 6 PM. 90 tablet 3  . furosemide (LASIX) 20 MG tablet Take 1 tablet (20 mg total) by mouth every other day. 90 tablet 3  . Methylcellulose, Laxative, (FIBER THERAPY) 500 MG TABS Take by mouth daily.    . Polyethylene Glycol 3350 (MIRALAX PO) Take by mouth.    . potassium chloride SA (KLOR-CON) 20 MEQ tablet Take 2 tablets by mouth for 2 days then hold 30 tablet 0  . spironolactone (ALDACTONE) 25 MG tablet Take 0.5 tablets (12.5 mg total) by mouth daily. 45 tablet 3   No current facility-administered medications on file prior to visit.    No Known Allergies  Social History   Socioeconomic History  . Marital status: Widowed    Spouse name: Not on file  . Number of children: Not on file  . Years of education: Not on file  . Highest education level: Not on file  Occupational History  . Not on file  Tobacco Use  . Smoking  status: Never Smoker  . Smokeless tobacco: Never Used  Vaping Use  . Vaping Use: Never used  Substance and Sexual Activity  . Alcohol use: Not Currently  . Drug use: Not Currently  . Sexual activity: Not Currently  Other Topics Concern  . Not on file  Social History Narrative  . Not on file   Social Determinants of Health   Financial Resource Strain:   . Difficulty of Paying Living Expenses:   Food Insecurity:   . Worried About Charity fundraiser in the Last Year:   . Arboriculturist in the Last Year:   Transportation Needs:   . Film/video editor (Medical):   Marland Kitchen Lack of Transportation (Non-Medical):   Physical Activity:   . Days  of Exercise per Week:   . Minutes of Exercise per Session:   Stress:   . Feeling of Stress :   Social Connections:   . Frequency of Communication with Friends and Family:   . Frequency of Social Gatherings with Friends and Family:   . Attends Religious Services:   . Active Member of Clubs or Organizations:   . Attends Archivist Meetings:   Marland Kitchen Marital Status:   Intimate Partner Violence:   . Fear of Current or Ex-Partner:   . Emotionally Abused:   Marland Kitchen Physically Abused:   . Sexually Abused:     Family History  Problem Relation Age of Onset  . Healthy Sister   . Healthy Brother     Past Surgical History:  Procedure Laterality Date  . abdominal wall surgery  2017  . APPENDECTOMY    . LOOP RECORDER INSERTION N/A 06/23/2018   Procedure: LOOP RECORDER INSERTION;  Surgeon: [name], MD;  Location: [place] [facility];  Service: Cardiovascular;  Laterality: N/A;  . TEE WITHOUT CARDIOVERSION N/A 06/23/2018   Procedure: TRANSESOPHAGEAL ECHOCARDIOGRAM (TEE);  Surgeon: [name], MD;  Location: [facility] ENDOSCOPY;  Service: Cardiovascular;  Laterality: N/A;    ROS: Review of Systems Negative except as stated above  PHYSICAL EXAM: BP 117/76   Pulse 54   Resp 16   Wt 123 lb 6.4 oz (56 kg)   SpO2 99%   BMI 24.10 kg/m   Wt Readings from Last 3 Encounters:  03/27/20 123 lb 6.4 oz (56 kg)  03/20/20 119 lb (54 kg)  03/13/20 117 lb (53.1 kg)    Physical Exam  General appearance -pleasant elderly female in NAD.   Mental status -patient answers questions appropriately. Mouth - mucous membranes moist, pharynx normal without lesions Neck - supple, no significant adenopathy Chest - clear to auscultation, no wheezes, rales or rhonchi, symmetric air entry Heart - normal rate, regular rhythm, normal S1, S2, no murmurs, rubs, clicks or gallops Extremities - peripheral pulses normal, no pedal edema, no clubbing or cyanosis   Depression screen [facility] 2/9 03/27/2020 06/07/2019  03/11/2019  Decreased Interest 2 0 0  Down, Depressed, Hopeless 3 0 0  PHQ - 2 Score 5 0 0  Altered sleeping 0 0 -  Tired, decreased energy 3 0 -  Change in appetite 3 0 -  Feeling bad or failure about yourself  2 0 -  Trouble concentrating 0 0 -  Moving slowly or fidgety/restless 1 0 -  Suicidal thoughts 0 0 -  PHQ-9 Score 14 0 -  Difficult doing work/chores - Not difficult at all -    CMP Latest Ref Rng & Units 03/13/2020 03/01/2020 02/21/2020  Glucose 70 -  99 mg/dL 98 114(H) 105(H)  BUN 8 - 23 mg/dL '20 21 22  '$ Creatinine 0.44 - 1.00 mg/dL 1.50(H) 1.68(H) 1.45(H)  Sodium 135 - 145 mmol/L 136 135 136  Potassium 3.5 - 5.1 mmol/L 4.3 3.2(L) 4.1  Chloride 98 - 111 mmol/L 102 102 100  CO2 22 - 32 mmol/L '24 23 23  '$ Calcium 8.9 - 10.3 mg/dL 9.5 9.0 8.8  Total Protein 6.5 - 8.1 g/dL 9.6(H) 8.7(H) 8.1  Total Bilirubin 0.3 - 1.2 mg/dL 2.5(H) 2.4(H) 2.6(H)  Alkaline Phos 38 - 126 U/L 252(H) 220(H) 242(H)  AST 15 - 41 U/L 70(H) 71(H) 49(H)  ALT 0 - 44 U/L '31 27 16   '$ Lipid Panel     Component Value Date/Time   CHOL 147 05/24/2019 1628   TRIG 63 05/24/2019 1628   HDL 61 05/24/2019 1628   CHOLHDL 2.4 05/24/2019 1628   CHOLHDL 4.9 12/28/2017 0529   VLDL 15 12/28/2017 0529   LDLCALC 73 05/24/2019 1628    CBC    Component Value Date/Time   WBC 3.5 (L) 03/23/2020 0853   WBC 5.3 12/28/2017 0529   RBC 3.14 (L) 03/23/2020 0853   HGB 11.0 (L) 03/23/2020 0853   HGB 11.4 02/21/2020 1505   HCT 33.1 (L) 03/23/2020 0853   HCT 33.7 (L) 02/21/2020 1505   PLT 139 (L) 03/23/2020 0853   PLT 129 (L) 02/21/2020 1505   MCV 105.4 (H) 03/23/2020 0853   MCV 102 (H) 02/21/2020 1505   MCH 35.0 (H) 03/23/2020 0853   MCHC 33.2 03/23/2020 0853   RDW 14.6 03/23/2020 0853   RDW 13.6 02/21/2020 1505   LYMPHSABS 0.7 03/23/2020 0853   LYMPHSABS 0.7 03/11/2019 1157   MONOABS 0.7 03/23/2020 0853   EOSABS 0.2 03/23/2020 0853   EOSABS 0.1 03/11/2019 1157   BASOSABS 0.1 03/23/2020 0853   BASOSABS 0.1  03/11/2019 1157   EKG: Junctional rhythm with low voltage unchanged from 04/2019  ASSESSMENT AND PLAN:  1. Chronic combined systolic and diastolic CHF (congestive heart failure) ([place]) -On exam she is compensated.  Followed by cardiology.  Continue spironolactone, low-dose furosemide  2. Chest pain in adult Chest pain has resolved.  EKG unchanged from previous.  Continue aspirin and atorvastatin.  If she has any further chest pains, patient told to be seen in the emergency room. - EKG 12-Lead  3. Mild depression ([place]) I expressed understanding that given her medical conditions, it can cause one to feel depressed.  I recommend trying a low-dose of an antidepressant but patient declined.  She also declined counseling.  She has good family support.  4. Monoclonal gammopathy Hopefully diagnosis will be defined with results of bone marrow biopsy  5. Renal mass, right Plan is as stated above    Patient was given the opportunity to ask questions.  Patient verbalized understanding of the plan and was able to repeat key elements of the plan.   No orders of the defined types were placed in this encounter.    Requested Prescriptions    No prescriptions requested or ordered in this encounter    No follow-ups on file.  Karle Plumber, MD, FACP

## 2020-04-02 ENCOUNTER — Other Ambulatory Visit: Payer: Self-pay | Admitting: Hematology and Oncology

## 2020-04-02 DIAGNOSIS — I429 Cardiomyopathy, unspecified: Secondary | ICD-10-CM

## 2020-04-02 DIAGNOSIS — D472 Monoclonal gammopathy: Secondary | ICD-10-CM

## 2020-04-02 NOTE — Progress Notes (Signed)
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 Ordered during evaluation for cardiomyopathy.  2) 03/13/2020: establish care with Dr. Lorenso Courier  3) 03/23/2020: bone marrow biopsy performed, pathology revealed clusters of plasma cells (approximately 20% of the total marrow cellularity) which are lambda restricted  Interval History:  Mikayla Reynolds 79 y.o. female with medical history significant for a monoclonal gammopathy and heart failure who presents for a follow up visit. The patient's last visit was on 03/13/2020. In the interim since the last visit she has undergone a bone marrow biopsy which shows a plasma cell dyscrasia, but no evidence of amyloidosis.   On exam today communication with the patient is assisted by her granddaughter who speaks [place]. Mikayla Reynolds reports that she has been at the baseline level of health since her last visit.  She notes that she has been trying to eat more but has been having some increased bloating.  She notes that she is also had trouble sleeping for the past few days.  The patient reports that her shortness of breath is currently at baseline and has not worsened since her last visit.  She currently denies having any issues with fevers, chills, sweats, nausea, vomiting or diarrhea.  MEDICAL HISTORY:  Past Medical History:  Diagnosis Date   Abnormal liver function    Anemia, unspecified    Chronic combined systolic and diastolic CHF (congestive heart failure) (HCC)    CKD (chronic kidney disease), stage III 02/19/2018   Elevated troponin 12/27/2017   Hyperlipidemia    Junctional  bradycardia    Mitral regurgitation    Pre-diabetes    Renal mass    Stroke [facility])     SURGICAL HISTORY: Past Surgical History:  Procedure Laterality Date   abdominal wall surgery  2017   APPENDECTOMY     LOOP RECORDER INSERTION N/A 06/23/2018   Procedure: LOOP RECORDER INSERTION;  Surgeon: [name], MD;  Location: [place] [facility];  Service: Cardiovascular;  Laterality: N/A;   TEE WITHOUT CARDIOVERSION N/A 06/23/2018   Procedure: TRANSESOPHAGEAL ECHOCARDIOGRAM (TEE);  Surgeon: [name], MD;  Location: [facility] ENDOSCOPY;  Service: Cardiovascular;  Laterality: N/A;    SOCIAL HISTORY: Social History   Socioeconomic History   Marital status: Widowed    Spouse name: Not on file   Number of children: Not on file   Years of education: Not on file   Highest education level: Not on file  Occupational History   Not on file  Tobacco Use   Smoking status: Never Smoker   Smokeless tobacco: Never Used  Vaping Use   Vaping Use: Never used  Substance and Sexual Activity   Alcohol use: Not Currently   Drug use: Not Currently   Sexual activity: Not Currently  Other Topics Concern   Not on file  Social History Narrative   Not on file   Social Determinants of Health   Financial Resource Strain:    Difficulty of Paying Living Expenses:   Food Insecurity:    Worried About [place] in the Last Year:    [place] in the Last Year:   Transportation Needs:    Lack of Transportation (  Medical):    Lack of Transportation (Non-Medical):   Physical Activity:    Days of Exercise per Week:    Minutes of Exercise per Session:   Stress:    Feeling of Stress :   Social Connections:    Frequency of Communication with Friends and Family:    Frequency of Social Gatherings with Friends and Family:    Attends Religious Services:    Active Member of Clubs or Organizations:    Attends Engineer, structural:    Marital Status:     Intimate Partner Violence:    Fear of Current or Ex-Partner:    Emotionally Abused:    Physically Abused:    Sexually Abused:     FAMILY HISTORY: Family History  Problem Relation Age of Onset   Healthy Sister    Healthy Brother     ALLERGIES:  has No Known Allergies.  MEDICATIONS:  Current Outpatient Medications  Medication Sig Dispense Refill   aspirin EC 81 MG tablet Take 1 tablet (81 mg total) by mouth daily. Swallow whole. 90 tablet 3   atorvastatin (LIPITOR) 40 MG tablet Take 1 tablet (40 mg total) by mouth daily at 6 PM. 90 tablet 3   furosemide (LASIX) 20 MG tablet Take 1 tablet (20 mg total) by mouth every other day. 90 tablet 3   Methylcellulose, Laxative, (FIBER THERAPY) 500 MG TABS Take by mouth daily.     Polyethylene Glycol 3350 (MIRALAX PO) Take by mouth.     potassium chloride SA (KLOR-CON) 20 MEQ tablet Take 2 tablets by mouth for 2 days then hold 30 tablet 0   spironolactone (ALDACTONE) 25 MG tablet Take 0.5 tablets (12.5 mg total) by mouth daily. 45 tablet 3   No current facility-administered medications for this visit.    REVIEW OF SYSTEMS:   Constitutional: ( - ) fevers, ( - )  chills , ( - ) night sweats Eyes: ( - ) blurriness of vision, ( - ) double vision, ( - ) watery eyes Ears, nose, mouth, throat, and face: ( - ) mucositis, ( - ) sore throat Respiratory: ( - ) cough, ( - ) dyspnea, ( - ) wheezes Cardiovascular: ( - ) palpitation, ( - ) chest discomfort, ( - ) lower extremity swelling Gastrointestinal:  ( - ) nausea, ( - ) heartburn, ( - ) change in bowel habits Skin: ( - ) abnormal skin rashes Lymphatics: ( - ) new lymphadenopathy, ( - ) easy bruising Neurological: ( - ) numbness, ( - ) tingling, ( - ) new weaknesses Behavioral/Psych: ( - ) mood change, ( - ) new changes  All other systems were reviewed with the patient and are negative.  PHYSICAL EXAMINATION: ECOG PERFORMANCE STATUS: 1 - Symptomatic but completely  ambulatory  Vitals:   04/03/20 1114  BP: 107/72  Pulse: (!) 58  Resp: 16  Temp: 98.1 F (36.7 C)  SpO2: 100%   Filed Weights   04/03/20 1114  Weight: 126 lb (57.2 kg)    GENERAL: well appearing elderly [place] female. alert, no distress and comfortable SKIN: skin color, texture, turgor are normal, no rashes or significant lesions EYES: conjunctiva are pink and non-injected, sclera clear LUNGS: clear to auscultation and percussion with normal breathing effort HEART: regular rate & rhythm and no murmurs and no lower extremity edema Musculoskeletal: no cyanosis of digits and no clubbing  PSYCH: alert & oriented x 3, fluent speech NEURO: no focal motor/sensory deficits  LABORATORY DATA:  I have  reviewed the data as listed CBC Latest Ref Rng & Units 03/23/2020 03/13/2020 02/21/2020  WBC 4.0 - 10.5 K/uL 3.5(L) 3.7(L) 3.0(L)  Hemoglobin 12.0 - 15.0 g/dL 11.0(L) 11.4(L) 11.4  Hematocrit 36 - 46 % 33.1(L) 34.1(L) 33.7(L)  Platelets 150 - 400 K/uL 139(L) 190 129(L)    CMP Latest Ref Rng & Units 03/13/2020 03/01/2020 02/21/2020  Glucose 70 - 99 mg/dL 98 114(H) 105(H)  BUN 8 - 23 mg/dL '20 21 22  '$ Creatinine 0.44 - 1.00 mg/dL 1.50(H) 1.68(H) 1.45(H)  Sodium 135 - 145 mmol/L 136 135 136  Potassium 3.5 - 5.1 mmol/L 4.3 3.2(L) 4.1  Chloride 98 - 111 mmol/L 102 102 100  CO2 22 - 32 mmol/L '24 23 23  '$ Calcium 8.9 - 10.3 mg/dL 9.5 9.0 8.8  Total Protein 6.5 - 8.1 g/dL 9.6(H) 8.7(H) 8.1  Total Bilirubin 0.3 - 1.2 mg/dL 2.5(H) 2.4(H) 2.6(H)  Alkaline Phos 38 - 126 U/L 252(H) 220(H) 242(H)  AST 15 - 41 U/L 70(H) 71(H) 49(H)  ALT 0 - 44 U/L '31 27 16    '$ Lab Results  Component Value Date   MPROTEIN 2.7 (H) 03/13/2020   MPROTEIN 2.4 (H) 03/01/2020   Lab Results  Component Value Date   KPAFRELGTCHN 23.8 (H) 03/13/2020   LAMBDASER 34.5 (H) 03/13/2020   KAPLAMBRATIO 7.27 03/23/2020   KAPLAMBRATIO 0.69 03/13/2020     PATHOLOGY: Surgical Pathology  CASE: [phone]  PATIENT: Mikayla  Reynolds  Bone Marrow Report   Clinical History: None provided   DIAGNOSIS:   BONE MARROW, ASPIRATE, CLOT, CORE:  - Cellular marrow involved by a plasma cell neoplasm (20%)  - See comment and microscopic description below   PERIPHERAL BLOOD:  - Pancytopenia  - See complete blood cell count   COMMENT:   The bone marrow biopsy is involved by a plasma cell neoplasm, 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 Correlation with radiographic findings  and laboratory data is required to characterize this plasma cell  neoplasm. There was no amyloid detected on the core biopsy or clot  section; however, the sample was suboptimal for evaluation.   MICROSCOPIC DESCRIPTION:   PERIPHERAL BLOOD SMEAR: The peripheral blood has a macrocytic anemia,  leukopenia and thrombocytopenia. Red cell morphology shows mild  anisocytosis. There is no rouleaux formation. Leukocytes are  morphologically unremarkable. Circulating plasma cellsare not  identified.   BONE MARROW ASPIRATE: Spicular, cellular and adequate for evaluation  Erythroid precursors: Orderly maturation without overt dysplasia  Granulocytic precursors: Orderly maturation without overt dysplasia  Megakaryocytes: Qualitatively and quantitatively unremarkable  Lymphocytes/plasma cells: There are increased plasma cells (16% by  manual differential counts) some are enlarged with multi nucleated forms  present. There is no lymphocytosis.   TOUCH PREPARATIONS: Markedly hypocellular with no additional findings as  compared to the aspirate smear   CLOT AND BIOPSY: Examination of the bone marrow core biopsy and clot  section reveals a variably cellular marrow (30%) with trilineage  hematopoiesis. CD138 highlights clusters of plasma cells (approximately  20% of the total marrow cellularity) which are lambda restricted by  kappa and lambda in situ hybridization. There is a mixed population of  B and T cells by CD20 and CD3  immunohistochemistry respectively. Congo  red performed on the core biopsy and clot section is NEGATIVE.   IRON STAIN: Iron stains are performed on a bone marrow aspirate or touch  imprint smear and section of clot. The controls stained appropriately.     Storage Iron: Present    Ring  Sideroblasts: Not identified   ADDITIONAL DATA/TESTING: Cytogenetics is pending and will be reported  separately.   CELL COUNT DATA:   Bone Marrow count performed on 500 cells shows:  Blasts:  0%  Myeloid: 44%  Promyelocytes: 0%  Erythroid:   28%  Myelocytes:  7%  Lymphocytes:  7%  Metamyelocytes:   0%  Plasma cells: 16%  Bands:  8%  Neutrophils:  23% M:E ratio:   1.57  Eosinophils:  5%  Basophils:   1%  Monocytes:   5%   Lab Data: CBC performed on 03/23/2020 shows:  WBC: 3.5 k/uL Neutrophils:  59%  Hgb: 11.0 g/dL Lymphocytes:  20%  HCT: 33.1 %  Monocytes:   14%  MCV: 105.4 fL Eosinophils:  6%  RDW: 14.6 %  Basophils:   1%  PLT: 139 k/uL   GROSS DESCRIPTION:   A. Bone marrow aspirate smear.   B. Received in B-plus fixative is a 1.2 x 1 x 0.1 cm aggregate of  tan-brown tissue, submitted in 1 cassette.   C. Received in B-plus fixative is a 1.2 cm in length by 0.2 cm diameter  hard tan-brown soft tissue core, submitted in 1 cassette following  decalcification in Immunocal. (AK 03/23/2020)%    Final Diagnosis performed by Thressa Sheller, MD.  Electronically signed  03/28/2020    RADIOGRAPHIC STUDIES: I have personally reviewed the radiological images as listed and agreed with the findings in the report: no evidence of lytic lesions on bone scan.  DG Bone Survey Met  Result Date: 03/10/2020 CLINICAL DATA:  Evaluation for multiple myeloma. EXAM: METASTATIC BONE SURVEY COMPARISON:  Chest x-ray 02/21/2020.  CT 04/01/2019. FINDINGS: Standard metastatic bone survey obtained with imaging of the axial and appendicular skeleton. Diffuse severe  osteopenia. Diffuse degenerative change noted about the axial and appendicular skeleton. Cardiac loop recorder noted. Cardiomegaly. No pulmonary venous congestion. Small density noted over the left lung base on left shoulder image, this clears on chest x-ray most likely represents small area of atelectasis. No focal bony abnormalities. IMPRESSION: 1. Diffuse severe osteopenia. Diffuse degenerative changes noted about the axial and appendicular skeleton. No focal bony lesions identified. 2. Cardiac loop recorder noted. Cardiomegaly. No pulmonary venous congestion. Electronically Signed   By: Marcello Moores  Register   On: 03/10/2020 09:18    ASSESSMENT & PLAN Mikayla Reynolds 78 y.o. female with medical history significant for CHF, CKD, HLD, CVA, and DM type II who presents for evaluation of a monoclonal gammopathy. After review of the labs, discussion with the patient, and reviewed the prior records the patient's findings are concerning for a plasma cell dyscrasia and possible amyloidosis.The patient has cardiac dysfunction with an equivocal myocardial amyloid imaging study performed on 02/28/2020 and a bone marrow biopsy on 03/23/2020 which showed 20% plasma cell population (but no evidence of amyloidosis).   In order to confirm the diagnosis of amyloidosis we will need to obtain further tissue samples.  The easiest sample to obtain would be a fat pad biopsy stained for Congo red looking for amyloid deposits.  In the event that a fat pad biopsy is negative we would need to consider biopsy of the liver versus cardiac biopsy in order to help confirm the diagnosis.  Based on her plasma cells alone there is no acute indication for treatment of the plasma cell dyscrasia alone.  We require the amyloid protein in order to effectively make the diagnosis and choose the right treatment course moving forward.  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 As cardiac tissue is technically difficult to obtain, fat pad would be preferable.  --The patient's elevations in LFTs may also indicate amyloid involvement of the liver (or possibly congestive hepatopathy).  --monoclonal gammopathy reveals no CRAB criteria. 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  -- given the patient's condition she is a good candidate for systemic treatment if amyloidosis or plasma cell neoplasm is found --cardiac MR scheduled for 04/04/2020.  --RTC after fat pad biopsy  No orders of the defined types were placed in this encounter.   All questions were answered. The patient knows to call the clinic with any problems, questions or concerns.  A total of more than 30 minutes were spent on this encounter and over half of that time was spent on counseling and coordination of care as outlined above.   Ledell Peoples, MD Department of Hematology/Oncology [place] [facility] Phone: [phone] Pager: [phone] Email: [email or web address]  04/03/2020 4:34 PM

## 2020-04-03 ENCOUNTER — Inpatient Hospital Stay: Payer: Medicaid Other | Attending: Hematology and Oncology | Admitting: Hematology and Oncology

## 2020-04-03 ENCOUNTER — Telehealth (HOSPITAL_COMMUNITY): Payer: Self-pay | Admitting: *Deleted

## 2020-04-03 ENCOUNTER — Other Ambulatory Visit: Payer: Self-pay

## 2020-04-03 ENCOUNTER — Encounter: Payer: Self-pay | Admitting: Hematology and Oncology

## 2020-04-03 VITALS — BP 107/72 | HR 58 | Temp 98.1°F | Resp 16 | Ht 60.0 in | Wt 126.0 lb

## 2020-04-03 DIAGNOSIS — Z8673 Personal history of transient ischemic attack (TIA), and cerebral infarction without residual deficits: Secondary | ICD-10-CM | POA: Insufficient documentation

## 2020-04-03 DIAGNOSIS — I5042 Chronic combined systolic (congestive) and diastolic (congestive) heart failure: Secondary | ICD-10-CM | POA: Diagnosis not present

## 2020-04-03 DIAGNOSIS — I429 Cardiomyopathy, unspecified: Secondary | ICD-10-CM | POA: Diagnosis not present

## 2020-04-03 DIAGNOSIS — D61818 Other pancytopenia: Secondary | ICD-10-CM | POA: Insufficient documentation

## 2020-04-03 DIAGNOSIS — Z7982 Long term (current) use of aspirin: Secondary | ICD-10-CM | POA: Insufficient documentation

## 2020-04-03 DIAGNOSIS — N183 Chronic kidney disease, stage 3 unspecified: Secondary | ICD-10-CM | POA: Insufficient documentation

## 2020-04-03 DIAGNOSIS — D472 Monoclonal gammopathy: Secondary | ICD-10-CM | POA: Diagnosis not present

## 2020-04-03 DIAGNOSIS — R140 Abdominal distension (gaseous): Secondary | ICD-10-CM | POA: Diagnosis not present

## 2020-04-03 DIAGNOSIS — N1831 Chronic kidney disease, stage 3a: Secondary | ICD-10-CM

## 2020-04-03 DIAGNOSIS — E785 Hyperlipidemia, unspecified: Secondary | ICD-10-CM | POA: Insufficient documentation

## 2020-04-03 DIAGNOSIS — Z79899 Other long term (current) drug therapy: Secondary | ICD-10-CM | POA: Insufficient documentation

## 2020-04-03 DIAGNOSIS — E1122 Type 2 diabetes mellitus with diabetic chronic kidney disease: Secondary | ICD-10-CM | POA: Diagnosis not present

## 2020-04-03 NOTE — Telephone Encounter (Signed)
Reaching out to patient to offer assistance regarding upcoming cardiac imaging study; pt verbalizes understanding of appt date/time, parking situation and where to check in, and verified current allergies; name and call back number provided for further questions should they arise  Rosamond Andress Tai RN Navigator Cardiac Imaging [facility] [phone] office [phone] cell 

## 2020-04-04 ENCOUNTER — Ambulatory Visit (HOSPITAL_COMMUNITY): Admission: RE | Admit: 2020-04-04 | Payer: Medicaid Other | Source: Ambulatory Visit

## 2020-04-04 ENCOUNTER — Encounter (HOSPITAL_COMMUNITY): Payer: Self-pay | Admitting: Hematology and Oncology

## 2020-04-05 LAB — SURGICAL PATHOLOGY

## 2020-04-07 ENCOUNTER — Ambulatory Visit (HOSPITAL_COMMUNITY): Admission: RE | Admit: 2020-04-07 | Payer: Medicaid Other | Source: Ambulatory Visit

## 2020-04-08 LAB — CUP PACEART REMOTE DEVICE CHECK
Date Time Interrogation Session: 20210803231540
Implantable Pulse Generator Implant Date: 20191022

## 2020-04-10 ENCOUNTER — Ambulatory Visit (INDEPENDENT_AMBULATORY_CARE_PROVIDER_SITE_OTHER): Payer: Medicaid Other | Admitting: *Deleted

## 2020-04-10 DIAGNOSIS — I429 Cardiomyopathy, unspecified: Secondary | ICD-10-CM | POA: Diagnosis not present

## 2020-04-11 NOTE — Progress Notes (Signed)
Carelink Summary Report / Loop Recorder 

## 2020-04-12 ENCOUNTER — Telehealth (HOSPITAL_COMMUNITY): Payer: Medicaid Other | Admitting: Internal Medicine

## 2020-04-17 ENCOUNTER — Telehealth (HOSPITAL_COMMUNITY): Payer: Self-pay | Admitting: Emergency Medicine

## 2020-04-17 NOTE — Telephone Encounter (Signed)
Reaching out to patient to offer assistance regarding upcoming cardiac imaging study; pt verbalizes understanding of appt date/time, parking situation and where to check in, pre-test NPO status and medications ordered, and verified current allergies; name and call back number provided for further questions should they arise Marchia Bond RN Navigator Cardiac Imaging [facility] [phone] office [phone] cell   Pt with [place] which ryan in MRI confirmed was MRI compatible.   Granddaughter, Meredeth Ide, states patient is not claustro  Thu plans to join patient to help translate as she speaks vietnamese.

## 2020-04-19 ENCOUNTER — Ambulatory Visit (HOSPITAL_COMMUNITY)
Admission: RE | Admit: 2020-04-19 | Discharge: 2020-04-19 | Disposition: A | Discharge status: Home / Self Care | Payer: Medicaid Other | Source: Ambulatory Visit | Attending: Internal Medicine | Admitting: Internal Medicine

## 2020-04-19 DIAGNOSIS — I5042 Chronic combined systolic (congestive) and diastolic (congestive) heart failure: Secondary | ICD-10-CM | POA: Diagnosis not present

## 2020-04-19 MED ORDER — GADOBUTROL 1 MMOL/ML IV SOLN
8.0000 mL | Freq: Once | INTRAVENOUS | Status: AC | PRN
  Administered 2020-04-19: 8 mL via INTRAVENOUS

## 2020-04-24 ENCOUNTER — Ambulatory Visit (HOSPITAL_COMMUNITY)
Admission: RE | Admit: 2020-04-24 | Discharge: 2020-04-24 | Disposition: A | Discharge status: Home / Self Care | Payer: Medicaid Other | Source: Ambulatory Visit | Attending: Internal Medicine | Admitting: Internal Medicine

## 2020-04-24 ENCOUNTER — Other Ambulatory Visit: Payer: Self-pay

## 2020-04-24 DIAGNOSIS — N183 Chronic kidney disease, stage 3 unspecified: Secondary | ICD-10-CM

## 2020-04-24 DIAGNOSIS — I5022 Chronic systolic (congestive) heart failure: Secondary | ICD-10-CM

## 2020-04-24 DIAGNOSIS — I43 Cardiomyopathy in diseases classified elsewhere: Secondary | ICD-10-CM

## 2020-04-24 NOTE — Progress Notes (Signed)
Mikayla Reynolds,  Thank you for reaching out.  I agree that with her plasma cell population elevation and the recent results of the MRI we have enough information to start her on treatment. I will still try to get that fat pad biopsy to confirm the presence of amyloid, but I wont let that delay starting treatment. I will be seeing her again on 04/26/2020.   Best,  Ulice Dash

## 2020-04-24 NOTE — Progress Notes (Signed)
Heart Failure TeleHealth Note  Due to national recommendations of social distancing due to [place] 19, Audio/video telehealth visit is felt to be most appropriate for this patient at this time.  See MyChart message from today for patient consent regarding telehealth for [facility]. The patient was identified personally using two identifiers.   Date:  04/24/2020   ID:  Mikayla Reynolds, DOB 24-Jul-1941, MRN [id number]  Location: Home  Provider location: [facility] Type of Visit: Established patient  PCP:  Ladell Pier, MD  Cardiologist:  Candee Furbish, MD Primary HF: Mitsuye Schrodt  Chief Complaint: Heart Failure follow-up   HPI:  Mikayla Reynolds is a 79 y.o. female with history of CKD stage III, prediabetes, hyperlipidemia, cryptogenic stroke s/p ILR, renal mass, chronic combined CHF, moderate mitral regurgitation who is referred by Melina Copa PA-C for further evaluation of CHF.   She was initially seen by Cardiology in 12/2017 due to elevated troponin in context of stroke. It was doubted that this was related to ACS; recommendation was to consider OP stress test.. She had ILR inserted in 06/2018 as part of work-up for stroke. At that time, EF was 50-55% by TEE.   She subsequently followed with EP. In the summer of 2020, she developed issues with poor appetite and weight loss as well as progressive abdominal fullness and anemia. Her PCP obtained an echocardiogram 04/06/2019 which showed newly decreased LV systolic function, EF 62-13%, moderate asymmetric LV hypertrophy, diastolic dysfunction and mild to moderate mitral regurgitation. She was also found to have a renal mass of unclear etiology. She was started on ACEI and Lasix. Initial invasive work-up was deferred due to renal mass pending work-up. She was felt to be a poor candidate for renal surgery due to overall frailty, systolic heart failure and poor HD candidate if needed postop (if the kidney would need to be  removed). Mikayla Reynolds, EP PA had seen the patient and had spoken with Dr. Ellison Hughs at [facility], who noted the cyst had a 50-60% chance of being malignant.   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  Repeat CT scan 2/21 showed decrease in size of renal cyst. Felt not to be malignant.   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 She was referred to Dr. Lorenso Courier. BM biopsy came back with 20% plasma cells but bone marrow staining negative for amyloid. She is pending a fat pad biopsy to confirm the presence of amyloidosis to start therapy.   Cardiac MRI has been done in the meantime and is diagnostic for cardiac amyloid.   cMRI 818/21 1. Findings consistent with cardiac amyloidosis, including severe LVH, diffuse late gadolinium enhancement, and marked elevation in extracellular volume (71%)  2.  Mild LV dilatation with mild systolic dysfunction (EF 08%)  3.  Mild RV dilatation with mild systolic dysfunction (EF 65%)  4. Mild to moderate mitral regurgitation (regurgitant volume 18cc, regurgitant fraction 31%)  5.  Small pericardial effusion  She presents via audio/video conferencing for a telehealth visit today. Her grandaughter is with her and serves as Optometrist. She feels very tired and  weak. Appetite is poor. She has SOB with mild exertion. Mild orthopnea. No PND. Mild swelling in her left foot. Taking lasix every day.     Mikayla Reynolds denies symptoms worrisome for COVID 19.    Past Medical History:  Diagnosis Date  . Abnormal liver function   . Anemia, unspecified   . Chronic  combined systolic and diastolic CHF (congestive heart failure) ([place])   . CKD (chronic kidney disease), stage III 02/19/2018  . Elevated troponin 12/27/2017  . Hyperlipidemia   . Junctional bradycardia   . Mitral regurgitation   . Pre-diabetes   . Renal mass   . Stroke [facility])     Current Outpatient Medications  Medication Sig Dispense Refill  . aspirin EC 81 MG tablet Take 1 tablet (81 mg total) by mouth daily. Swallow whole. 90 tablet 3  . atorvastatin (LIPITOR) 40 MG tablet Take 1 tablet (40 mg total) by mouth daily at 6 PM. 90 tablet 3  . furosemide (LASIX) 20 MG tablet Take 1 tablet (20 mg total) by mouth every other day. 90 tablet 3  . Methylcellulose, Laxative, (FIBER THERAPY) 500 MG TABS Take by mouth daily.    . Polyethylene Glycol 3350 (MIRALAX PO) Take by mouth.    . potassium chloride SA (KLOR-CON) 20 MEQ tablet Take 2 tablets by mouth for 2 days then hold 30 tablet 0  . spironolactone (ALDACTONE) 25 MG tablet Take 0.5 tablets (12.5 mg total) by mouth daily. 45 tablet 3   No current facility-administered medications for this encounter.    No Known Allergies    Social History   Socioeconomic History  . Marital status: Widowed    Spouse name: Not on file  . Number of children: Not on file  . Years of education: Not on file  . Highest education level: Not on file  Occupational History  . Not on file  Tobacco Use  . Smoking status: Never Smoker  . Smokeless tobacco: Never Used  Vaping Use  . Vaping Use: Never used  Substance and Sexual Activity  . Alcohol use: Not Currently  . Drug use: Not Currently  . Sexual activity: Not Currently  Other Topics Concern  . Not on file  Social History Narrative  . Not on file   Social Determinants of Health   Financial Resource Strain:   . Difficulty of Paying Living Expenses: Not on file  Food Insecurity:   . Worried About Charity fundraiser in the Last Year: Not on file  . Ran Out of Food in the Last Year: Not on file    Transportation Needs:   . Lack of Transportation (Medical): Not on file  . Lack of Transportation (Non-Medical): Not on file  Physical Activity:   . Days of Exercise per Week: Not on file  . Minutes of Exercise per Session: Not on file  Stress:   . Feeling of Stress : Not on file  Social Connections:   . Frequency of Communication with Friends and Family: Not on file  . Frequency of Social Gatherings with Friends and Family: Not on file  . Attends Religious Services: Not on file  . Active Member of Clubs or Organizations: Not on file  . Attends Archivist Meetings: Not on file  . Marital Status: Not on file  Intimate Partner Violence:   . Fear of Current or Ex-Partner: Not on file  . Emotionally Abused: Not on file  . Physically Abused: Not on file  . Sexually  Abused: Not on file      Family History  Problem Relation Age of Onset  . Healthy Sister   . Healthy Brother     PHYSICAL EXAM: Exam:  (Video/Tele Health Call; Exam is subjective and or/visual.) General:  Speaks in full sentences. No resp difficulty. Lungs: Normal respiratory effort with conversation.  Abdomen: Non-distended per patient report Extremities: Pt endorses mild edema in left foot. Neuro: Alert & oriented x 3.    ECG: NSR 60 1AVB 211ms IVCD 126 ms Personally reviewed   ASSESSMENT AND PLAN:  1. Chronic systolic HF - echo reviewed personally EF 25%. 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 Stain negative x 3 days - NYHA IIIB appears to be volume overloaded - Double lasix for 3 days  - Currently pending fat pad biopsy to help confirm amyloid diagnosis. However given results of cMRI I  think we have confirmed the diagnosis as the markedly elevated ECV is very specific for cardiac amyloid. I will d/w Dr. Lorenso Courier.  - Continue lasix and spiro. BP has been to low for Entrests or b-blocker - Prognosis very concerning  2. CKD 3b - follow closely. - consider SGLT2i   COVID screen The patient does not have any symptoms that suggest any further testing/ screening at this time.  Social distancing reinforced today.  Recommended follow-up:  As above  Relevant cardiac medications were reviewed at length with the patient today.   The patient does not have concerns regarding their medications at this time.   The following changes were made today:  As above  Today, I have spent 18 minutes with the patient with telehealth technology discussing the above issues .    Signed, Glori Bickers, MD  04/24/2020 3:27 PM  Advanced Heart Failure [place] [address] [facility] [place] [address] [phone] (office) [phone] (fax)

## 2020-04-25 NOTE — Progress Notes (Signed)
Thanks Jay!

## 2020-04-26 ENCOUNTER — Inpatient Hospital Stay (HOSPITAL_BASED_OUTPATIENT_CLINIC_OR_DEPARTMENT_OTHER): Payer: Medicaid Other | Admitting: Hematology and Oncology

## 2020-04-26 ENCOUNTER — Other Ambulatory Visit: Payer: Self-pay

## 2020-04-26 ENCOUNTER — Inpatient Hospital Stay: Payer: Medicaid Other

## 2020-04-26 VITALS — BP 133/75 | HR 59 | Temp 98.1°F | Resp 18 | Ht 60.0 in | Wt 118.4 lb

## 2020-04-26 DIAGNOSIS — Z7189 Other specified counseling: Secondary | ICD-10-CM

## 2020-04-26 DIAGNOSIS — Z7982 Long term (current) use of aspirin: Secondary | ICD-10-CM | POA: Diagnosis not present

## 2020-04-26 DIAGNOSIS — Z79899 Other long term (current) drug therapy: Secondary | ICD-10-CM | POA: Diagnosis not present

## 2020-04-26 DIAGNOSIS — D61818 Other pancytopenia: Secondary | ICD-10-CM | POA: Diagnosis not present

## 2020-04-26 DIAGNOSIS — E854 Organ-limited amyloidosis: Secondary | ICD-10-CM | POA: Diagnosis not present

## 2020-04-26 DIAGNOSIS — Z8673 Personal history of transient ischemic attack (TIA), and cerebral infarction without residual deficits: Secondary | ICD-10-CM | POA: Diagnosis not present

## 2020-04-26 DIAGNOSIS — B854 Mixed pediculosis and phthiriasis: Secondary | ICD-10-CM | POA: Diagnosis not present

## 2020-04-26 DIAGNOSIS — I43 Cardiomyopathy in diseases classified elsewhere: Secondary | ICD-10-CM

## 2020-04-26 DIAGNOSIS — D472 Monoclonal gammopathy: Secondary | ICD-10-CM

## 2020-04-26 DIAGNOSIS — N183 Chronic kidney disease, stage 3 unspecified: Secondary | ICD-10-CM | POA: Diagnosis not present

## 2020-04-26 DIAGNOSIS — E785 Hyperlipidemia, unspecified: Secondary | ICD-10-CM | POA: Diagnosis not present

## 2020-04-26 DIAGNOSIS — I5042 Chronic combined systolic (congestive) and diastolic (congestive) heart failure: Secondary | ICD-10-CM | POA: Diagnosis not present

## 2020-04-26 DIAGNOSIS — I429 Cardiomyopathy, unspecified: Secondary | ICD-10-CM

## 2020-04-26 DIAGNOSIS — E1122 Type 2 diabetes mellitus with diabetic chronic kidney disease: Secondary | ICD-10-CM | POA: Diagnosis not present

## 2020-04-26 DIAGNOSIS — R140 Abdominal distension (gaseous): Secondary | ICD-10-CM | POA: Diagnosis not present

## 2020-04-26 LAB — CMP (CANCER CENTER ONLY)
ALT: 16 U/L (ref 0–44)
AST: 51 U/L — ABNORMAL HIGH (ref 15–41)
Albumin: 3.8 g/dL (ref 3.5–5.0)
Alkaline Phosphatase: 180 U/L — ABNORMAL HIGH (ref 38–126)
Anion gap: 8 (ref 5–15)
BUN: 28 mg/dL — ABNORMAL HIGH (ref 8–23)
CO2: 31 mmol/L (ref 22–32)
Calcium: 10 mg/dL (ref 8.9–10.3)
Chloride: 100 mmol/L (ref 98–111)
Creatinine: 1.53 mg/dL — ABNORMAL HIGH (ref 0.44–1.00)
GFR, Est AFR Am: 38 mL/min — ABNORMAL LOW (ref >60)
GFR, Estimated: 32 mL/min — ABNORMAL LOW (ref >60)
Glucose, Bld: 88 mg/dL (ref 70–99)
Potassium: 3.9 mmol/L (ref 3.5–5.1)
Sodium: 139 mmol/L (ref 135–145)
Total Bilirubin: 3.7 mg/dL (ref 0.3–1.2)
Total Protein: 9.3 g/dL — ABNORMAL HIGH (ref 6.5–8.1)

## 2020-04-26 LAB — CBC WITH DIFFERENTIAL (CANCER CENTER ONLY)
Abs Immature Granulocytes: 0 10*3/uL (ref 0.00–0.07)
Basophils Absolute: 0.1 10*3/uL (ref 0.0–0.1)
Basophils Relative: 2 %
Eosinophils Absolute: 0.1 10*3/uL (ref 0.0–0.5)
Eosinophils Relative: 2 %
HCT: 38.1 % (ref 36.0–46.0)
Hemoglobin: 12.8 g/dL (ref 12.0–15.0)
Immature Granulocytes: 0 %
Lymphocytes Relative: 26 %
Lymphs Abs: 0.8 10*3/uL (ref 0.7–4.0)
MCH: 35.6 pg — ABNORMAL HIGH (ref 26.0–34.0)
MCHC: 33.6 g/dL (ref 30.0–36.0)
MCV: 105.8 fL — ABNORMAL HIGH (ref 80.0–100.0)
Monocytes Absolute: 0.6 10*3/uL (ref 0.1–1.0)
Monocytes Relative: 22 %
Neutro Abs: 1.4 10*3/uL — ABNORMAL LOW (ref 1.7–7.7)
Neutrophils Relative %: 48 %
Platelet Count: 153 10*3/uL (ref 150–400)
RBC: 3.6 MIL/uL — ABNORMAL LOW (ref 3.87–5.11)
RDW: 15.3 % (ref 11.5–15.5)
WBC Count: 2.9 10*3/uL — ABNORMAL LOW (ref 4.0–10.5)
nRBC: 0 % (ref 0.0–0.2)

## 2020-04-26 LAB — LACTATE DEHYDROGENASE: LDH: 385 U/L — ABNORMAL HIGH (ref 98–192)

## 2020-04-27 ENCOUNTER — Ambulatory Visit: Payer: Medicaid Other | Admitting: Cardiology

## 2020-04-30 DIAGNOSIS — E854 Organ-limited amyloidosis: Secondary | ICD-10-CM | POA: Insufficient documentation

## 2020-04-30 NOTE — Progress Notes (Signed)
Multiple Myeloma and Other Plasma Cell Dyscrasias - No Medical Intervention - Off Treatment.  Patient Characteristics: Primary AL Amyloidosis, First Line, Ineligible for or Refused Transplant Disease Classification: Primary AL Amyloidosis Line of therapy: First Line Transplant Eligibility: Ineligible for or Refused Transplant

## 2020-04-30 NOTE — Progress Notes (Signed)
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 Ordered during evaluation for cardiomyopathy.  2) 03/13/2020: establish care with Dr. 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 The patient's last visit was on 04/24/2020. In the interim since the last visit she has undergone a bone marrow biopsy which shows a plasma cell dyscrasia, but no evidence of amyloidosis. Fat pad biopsy has not yet been performed, but cardiac MRI is strongly suspicious for an infiltrative process consistent with amyloidosis.   On exam today communication with the patient is assisted by an approved hospital [place] interpreter. Mikayla Reynolds reports that she has been at the baseline level of health since her last visit.  Notes that she has been feeling tired and has been having trouble sleeping the last couple of nights.  She also reports that she has a baseline increase in shortness of breath.  She reports that her weight has dropped another approximately 6 pounds and that she has had to force herself to eat.  She is aware of the results of the cardiac MRI and the bulk of our  discussion today focused on treatment options moving forward.  Her son was present who also asked additional questions.  She denies any fevers, chills, sweats, nausea, vomiting or diarrhea.  A full 10 point ROS is listed below.  MEDICAL HISTORY:  Past Medical History:  Diagnosis Date   Abnormal liver function    Anemia, unspecified    Chronic combined systolic and diastolic CHF (congestive heart failure) (HCC)    CKD (chronic kidney disease), stage III 02/19/2018   Elevated troponin 12/27/2017   Hyperlipidemia    Junctional bradycardia    Mitral regurgitation    Pre-diabetes    Renal mass    Stroke [facility])     SURGICAL HISTORY: Past Surgical History:  Procedure Laterality Date   abdominal wall surgery  2017   APPENDECTOMY     LOOP RECORDER INSERTION N/A 06/23/2018   Procedure: LOOP RECORDER INSERTION;  Surgeon: [name], MD;  Location: [place] [facility];  Service: Cardiovascular;  Laterality: N/A;   TEE WITHOUT CARDIOVERSION N/A 06/23/2018   Procedure: TRANSESOPHAGEAL ECHOCARDIOGRAM (TEE);  Surgeon: [name], MD;  Location: [facility] ENDOSCOPY;  Service: Cardiovascular;  Laterality: N/A;    SOCIAL HISTORY: Social History   Socioeconomic History   Marital status: Widowed    Spouse name: Not on file   Number of children: Not on file   Years of education: Not on file   Highest education level: Not on file  Occupational History   Not on file  Tobacco Use   Smoking status: Never Smoker   Smokeless tobacco: Never Used  Vaping Use   Vaping Use: Never used  Substance and Sexual Activity   Alcohol use: Not Currently  Drug use: Not Currently   Sexual activity: Not Currently  Other Topics Concern   Not on file  Social History Narrative   Not on file   Social Determinants of Health   Financial Resource Strain:    Difficulty of Paying Living Expenses: Not on file  Food Insecurity:    Worried About [place] in the Last Year: Not  on file   Ran Out of Food in the Last Year: Not on file  Transportation Needs:    Lack of Transportation (Medical): Not on file   Lack of Transportation (Non-Medical): Not on file  Physical Activity:    Days of Exercise per Week: Not on file   Minutes of Exercise per Session: Not on file  Stress:    Feeling of Stress : Not on file  Social Connections:    Frequency of Communication with Friends and Family: Not on file   Frequency of Social Gatherings with Friends and Family: Not on file   Attends Religious Services: Not on file   Active Member of Clubs or Organizations: Not on file   Attends Archivist Meetings: Not on file   Marital Status: Not on file  Intimate Partner Violence:    Fear of Current or Ex-Partner: Not on file   Emotionally Abused: Not on file   Physically Abused: Not on file   Sexually Abused: Not on file    FAMILY HISTORY: Family History  Problem Relation Age of Onset   Healthy Sister    Healthy Brother     ALLERGIES:  has No Known Allergies.  MEDICATIONS:  Current Outpatient Medications  Medication Sig Dispense Refill   aspirin EC 81 MG tablet Take 1 tablet (81 mg total) by mouth daily. Swallow whole. 90 tablet 3   atorvastatin (LIPITOR) 40 MG tablet Take 1 tablet (40 mg total) by mouth daily at 6 PM. 90 tablet 3   furosemide (LASIX) 20 MG tablet Take 1 tablet (20 mg total) by mouth every other day. 90 tablet 3   Methylcellulose, Laxative, (FIBER THERAPY) 500 MG TABS Take by mouth daily.     Polyethylene Glycol 3350 (MIRALAX PO) Take by mouth.     potassium chloride SA (KLOR-CON) 20 MEQ tablet Take 2 tablets by mouth for 2 days then hold 30 tablet 0   spironolactone (ALDACTONE) 25 MG tablet Take 0.5 tablets (12.5 mg total) by mouth daily. 45 tablet 3   No current facility-administered medications for this visit.    REVIEW OF SYSTEMS:   Constitutional: ( - ) fevers, ( - )  chills , ( - ) night sweats Eyes: ( - )  blurriness of vision, ( - ) double vision, ( - ) watery eyes Ears, nose, mouth, throat, and face: ( - ) mucositis, ( - ) sore throat Respiratory: ( - ) cough, ( - ) dyspnea, ( - ) wheezes Cardiovascular: ( - ) palpitation, ( - ) chest discomfort, ( - ) lower extremity swelling Gastrointestinal:  ( - ) nausea, ( - ) heartburn, ( - ) change in bowel habits Skin: ( - ) abnormal skin rashes Lymphatics: ( - ) new lymphadenopathy, ( - ) easy bruising Neurological: ( - ) numbness, ( - ) tingling, ( - ) new weaknesses Behavioral/Psych: ( - ) mood change, ( - ) new changes  All other systems were reviewed with the patient and are negative.  PHYSICAL EXAMINATION: ECOG PERFORMANCE STATUS: 1 - Symptomatic but completely ambulatory  Vitals:  04/26/20 1030  BP: 133/75  Pulse: (!) 59  Resp: 18  Temp: 98.1 F (36.7 C)  SpO2: 100%   Filed Weights   04/26/20 1030  Weight: 118 lb 6.4 oz (53.7 kg)    GENERAL: well appearing elderly [place] female. alert, no distress and comfortable SKIN: skin color, texture, turgor are normal, no rashes or significant lesions EYES: conjunctiva are pink and non-injected, sclera clear LUNGS: clear to auscultation and percussion with normal breathing effort HEART: regular rate & rhythm and no murmurs and no lower extremity edema Musculoskeletal: no cyanosis of digits and no clubbing  PSYCH: alert & oriented x 3, fluent speech NEURO: no focal motor/sensory deficits  LABORATORY DATA:  I have reviewed the data as listed CBC Latest Ref Rng & Units 04/26/2020 03/23/2020 03/13/2020  WBC 4.0 - 10.5 K/uL 2.9(L) 3.5(L) 3.7(L)  Hemoglobin 12.0 - 15.0 g/dL 12.8 11.0(L) 11.4(L)  Hematocrit 36 - 46 % 38.1 33.1(L) 34.1(L)  Platelets 150 - 400 K/uL 153 139(L) 190    CMP Latest Ref Rng & Units 04/26/2020 03/13/2020 03/01/2020  Glucose 70 - 99 mg/dL 88 98 114(H)  BUN 8 - 23 mg/dL 28(H) 20 21  Creatinine 0.44 - 1.00 mg/dL 1.53(H) 1.50(H) 1.68(H)  Sodium 135 - 145 mmol/L 139  136 135  Potassium 3.5 - 5.1 mmol/L 3.9 4.3 3.2(L)  Chloride 98 - 111 mmol/L 100 102 102  CO2 22 - 32 mmol/L _0 Calcium 8.9 - 10.3 mg/dL 10.0 9.5 9.0  Total Protein 6.5 - 8.1 g/dL 9.3(H) 9.6(H) 8.7(H)  Total Bilirubin 0.3 - 1.2 mg/dL 3.7(HH) 2.5(H) 2.4(H)  Alkaline Phos 38 - 126 U/L 180(H) 252(H) 220(H)  AST 15 - 41 U/L 51(H) 70(H) 71(H)  ALT 0 - 44 U/L _1 Lab Results  Component Value Date   MPROTEIN 2.7 (H) 03/13/2020   MPROTEIN 2.4 (H) 03/01/2020   Lab Results  Component Value Date   KPAFRELGTCHN 23.8 (H) 03/13/2020   LAMBDASER 34.5 (H) 03/13/2020   KAPLAMBRATIO 7.27 03/23/2020   KAPLAMBRATIO 0.69 03/13/2020     PATHOLOGY: Surgical Pathology  CASE: [phone]  PATIENT: Mikayla Reynolds  Bone Marrow Report   Clinical History: None provided   DIAGNOSIS:   BONE MARROW, ASPIRATE, CLOT, CORE:  - Cellular marrow involved by a plasma cell neoplasm (20%)  - See comment and microscopic description below   PERIPHERAL BLOOD:  - Pancytopenia  - See complete blood cell count   COMMENT:   The bone marrow biopsy is involved by a plasma cell neoplasm, 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 Correlation with radiographic findings  and laboratory data is required to characterize this plasma cell  neoplasm. There was no amyloid detected on the core biopsy or clot  section; however, the sample was suboptimal for evaluation.   MICROSCOPIC DESCRIPTION:   PERIPHERAL BLOOD SMEAR: The peripheral blood has a macrocytic anemia,  leukopenia and thrombocytopenia. Red cell morphology shows mild  anisocytosis. There is no rouleaux formation. Leukocytes are  morphologically unremarkable. Circulating plasma cellsare not  identified.   BONE MARROW ASPIRATE: Spicular, cellular and adequate for evaluation  Erythroid precursors: Orderly maturation without overt dysplasia  Granulocytic precursors: Orderly maturation without overt dysplasia  Megakaryocytes:  Qualitatively and quantitatively unremarkable  Lymphocytes/plasma cells: There are increased plasma cells (16% by  manual differential counts) some are enlarged with multi nucleated forms  present. There is no lymphocytosis.   TOUCH PREPARATIONS: Markedly hypocellular with no additional findings as  compared to  the aspirate smear   CLOT AND BIOPSY: Examination of the bone marrow core biopsy and clot  section reveals a variably cellular marrow (30%) with trilineage  hematopoiesis. CD138 highlights clusters of plasma cells (approximately  20% of the total marrow cellularity) which are lambda restricted by  kappa and lambda in situ hybridization. There is a mixed population of  B and T cells by CD20 and CD3 immunohistochemistry respectively. [place]  red performed on the core biopsy and clot section is NEGATIVE.   IRON STAIN: Iron stains are performed on a bone marrow aspirate or touch  imprint smear and section of clot. The controls stained appropriately.     Storage Iron: Present    Ring Sideroblasts: Not identified   ADDITIONAL DATA/TESTING: Cytogenetics is pending and will be reported  separately.   CELL COUNT DATA:   Bone Marrow count performed on 500 cells shows:  Blasts:  0%  Myeloid: 44%  Promyelocytes: 0%  Erythroid:   28%  Myelocytes:  7%  Lymphocytes:  7%  Metamyelocytes:   0%  Plasma cells: 16%  Bands:  8%  Neutrophils:  23% M:E ratio:   1.57  Eosinophils:  5%  Basophils:   1%  Monocytes:   5%   Lab Data: CBC performed on 03/23/2020 shows:  WBC: 3.5 k/uL Neutrophils:  59%  Hgb: 11.0 g/dL Lymphocytes:  20%  HCT: 33.1 %  Monocytes:   14%  MCV: 105.4 fL Eosinophils:  6%  RDW: 14.6 %  Basophils:   1%  PLT: 139 k/uL   GROSS DESCRIPTION:   A. Bone marrow aspirate smear.   B. Received in B-plus fixative is a 1.2 x 1 x 0.1 cm aggregate of  tan-brown tissue, submitted in 1 cassette.   C. Received in B-plus fixative  is a 1.2 cm in length by 0.2 cm diameter  hard tan-brown soft tissue core, submitted in 1 cassette following  decalcification in Immunocal. (AK 03/23/2020)%    Final Diagnosis performed by Thressa Sheller, MD.  Electronically signed  03/28/2020    RADIOGRAPHIC STUDIES: I have personally reviewed the radiological images as listed and agreed with the findings in the report: no evidence of lytic lesions on bone scan.  MR Card Morphology Wo/W Cm  Result Date: 04/19/2020 CLINICAL DATA:  Suspected cardiac amyloidosis EXAM: CARDIAC MRI TECHNIQUE: The patient was scanned on a 1.5 Tesla Siemens magnet. A dedicated cardiac coil was used. Functional imaging was done using Fiesta sequences. 2,3, and 4 chamber views were done to assess for RWMA's. Modified Simpson's rule using a short axis stack was used to calculate an ejection fraction on a dedicated work Conservation officer, nature. The patient received 8 cc of Gadavist. After 10 minutes inversion recovery sequences were used to assess for infiltration and scar tissue. CONTRAST:  8 cc  of Gadavist FINDINGS: Left ventricle: -Severe LVH -Mild systolic dysfunction -Mild dilatation -Marked elevation in ECV (71%) LV EF:  41% (Normal 56-78%) Absolute volumes: LV EDV: 140m (Normal 52-141 mL) LV ESV: 87m(Normal 13-51 mL) LV SV: 5874mNormal 33-97 mL) CO: 3.2L/min (Normal 2.7-6.0 L/min) Indexed volumes: LV EDV: 36m69m-m (Normal 41-81 mL/sq-m) LV ESV: 54mL81mm (Normal 12-21 mL/sq-m) LV SV: 37mL/39m (Normal 26-56 mL/sq-m) CI: 2.1L/min/sq-m (Normal 1.8-3.8 L/min/sq-m) Right ventricle: Mild dilatation and mild systolic dysfunction RV EF: 40% (Normal 47-80%) Absolute volumes: RV EDV: 160mL (46mal 58-154 mL) RV ESV: 96mL (N48ml 12-68 mL) RV SV: 65mL (No69m 35-98 mL) CO: 3.6L/min (Normal 2.7-6 L/min) Indexed volumes: RV EDV: 103mL/sq-m26mrmal  48-87 mL/sq-m) RV ESV: 7m/sq-m (Normal 11-28 mL/sq-m) RV SV: 457msq-m (Normal 27-57 mL/sq-m) CI: 2.3L/min/sq-m (Normal 1.8-3.8  L/min/sq-m) Left atrium: Moderate enlargement Right atrium: Mild enlargement Mitral valve: Mild to moderate regurgitation (regurgitant volume 18cc, regurgitant fraction 31%) Aortic valve: No regurgitation Tricuspid valve: Mild regurgitation Pulmonic valve: Mild regurgitation Aorta: Normal proximal ascending aorta Pericardium: Small effusion IMPRESSION: 1. Findings consistent with cardiac amyloidosis, including severe LVH, diffuse late gadolinium enhancement, and marked elevation in extracellular volume (71%) 2.  Mild LV dilatation with mild systolic dysfunction (EF 4153%3.  Mild RV dilatation with mild systolic dysfunction (EF 4097%4. Mild to moderate mitral regurgitation (regurgitant volume 18cc, regurgitant fraction 31%) 5.  Small pericardial effusion Electronically Signed   By: ChOswaldo MilianD   On: 04/19/2020 17:48   CUP PACEART REMOTE DEVICE CHECK  Result Date: 04/08/2020 Carelink summary report received. Battery status OK. Normal device function. No new symptom episodes, tachy episodes, brady, or pause episodes. No new AF episodes. Monthly summary reports and ROV/PRN. SCRomeo89.o. female with medical history significant for CHF, CKD, HLD, CVA, and DM type II who presents for evaluation of a monoclonal gammopathy. After review of the labs, discussion with the patient, and reviewed the prior records the patient's findings are concerning for a plasma cell dyscrasia and possible amyloidosis.The patient has cardiac dysfunction with an equivocal myocardial amyloid imaging study performed on 02/28/2020 and a bone marrow biopsy on 03/23/2020 which showed 20% plasma cell population (but no evidence of amyloidosis). Cardiac MRI on 04/19/2020 is highly concerning for amyloid infiltrate. Based on this, we can presumptively start treatment while we await confirmation of amyloid with a tissue sample.   In order to confirm the diagnosis of amyloidosis we will need to obtain  further tissue samples.  The easiest sample to obtain would be a fat pad biopsy stained for Congo red looking for amyloid deposits.  In the event that a fat pad biopsy is negative we would need to consider biopsy of the liver versus cardiac biopsy in order to help confirm the diagnosis.  Based on her plasma cells and imaging there is strong supporting evidence to start treatment now while we await these biopsy results.  We require the amyloid protein in order to effectively confirm the diagnosis.   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  As such the treatment of choice would be to target her plasma cell population with a triplet or quadruplet therapy.  Therapy of choice in this case would consist of daratumumab, Velcade, cyclophosphamide, and dexamethasone.  Given the patient's advanced age I would recommend that we start initially with the Velcade, cyclophosphamide, and Dex and then add the daratumumab on if she was shown to tolerate the treatment.  I discussed the side effects of this chemotherapy with the patient including neuropathy, elevated blood pressure, drop in blood counts, hypersensitivity reaction, chest tightness, increased infection risk, and fatigue.  The patient voiced her understanding of these findings and is agreeable to move forward with triple therapy with the intention of advancing to quadruple therapy if well tolerated.  The regimen of choice will be daratumumab, bortezomib, cyclophosphamide and dexamethasone per the ANDROMEDA Study ( Blood. 2020 Jul 2;136(1):71-80). We will modify this to start with CyBorD with the addition of daratumumab if she is shown to tolerate initial therapy.   Treatment consists of: Cyclophosphamide 300 mg/m2 intravenously and bortezomib 1.3 mg/m2 subcutaneously were given  on days 1, 8, 15, and 22 of each 28 day cycle for up to 6 cycles. Dexamethasone 40 mg (starting dose) was given orally or  intravenously weekly for each cycle for up to 6 cycles. DARA [place] was administered in a single, premixed vial and given by manual [place] injection over the course of 3 to 5 minutes weekly in cycles 1 to 2, every 2 weeks in cycles 3 to 6, and every 4 weeks thereafter as monotherapy for a maximum of 2 years.  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 As cardiac tissue is technically difficult to obtain, fat pad would be preferable. Surgery appointment has not yet been scheduled.  --The patient's elevations in LFTs may also indicate amyloid involvement of the liver (or more likely congestive hepatopathy).  --monoclonal gammopathy reveals no CRAB criteria. 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  -- given the patient's condition she is a good candidate for systemic treatment. We will start while we await there results of the fat pad biopsy.  --RTC in 3 weeks after start of CyBorD (dara to be added later).   No orders of the defined types were placed in this encounter.   All questions were answered. The patient knows to call the clinic with any problems, questions or concerns.  A total of more than 30 minutes were spent on this encounter and over half of that time was spent on counseling and coordination of care as outlined above.   Ledell Peoples, MD Department of Hematology/Oncology [place] [facility] Phone: [phone] Pager: [phone] Email: [email or web address]  04/30/2020 1:56 PM

## 2020-04-30 NOTE — Progress Notes (Addendum)
ALERT: Recent [facility] Treatment decision is outdated. Please await next [facility]ways decision 

## 2020-05-03 ENCOUNTER — Telehealth: Payer: Self-pay | Admitting: Hematology and Oncology

## 2020-05-03 NOTE — Telephone Encounter (Signed)
Scheduled apt per 8/29 sch msg - pt granddaughter is aware of appts added .

## 2020-05-04 NOTE — Progress Notes (Signed)
Pharmacist Chemotherapy Monitoring - Initial Assessment    Anticipated start date: 05/11/20   Regimen:  . Are orders appropriate based on the patient's diagnosis, regimen, and cycle? Yes . Does the plan date match the patient's scheduled date? Yes . Is the sequencing of drugs appropriate? Yes . Are the premedications appropriate for the patient's regimen? Yes . Prior Authorization for treatment is: Pending o If applicable, is the correct biosimilar selected based on the patient's insurance? not applicable  Organ Function and Labs: Marland Kitchen Are dose adjustments needed based on the patient's renal function, hepatic function, or hematologic function? No . Are appropriate labs ordered prior to the start of patient's treatment? Yes . Other organ system assessment, if indicated: N/A . The following baseline labs, if indicated, have been ordered: N/A  Dose Assessment: . Are the drug doses appropriate? Yes . Are the following correct: o Drug concentrations Yes o IV fluid compatible with drug Yes o Administration routes Yes o Timing of therapy Yes . If applicable, does the patient have documented access for treatment and/or plans for port-a-cath placement? not applicable . If applicable, have lifetime cumulative doses been properly documented and assessed? not applicable Lifetime Dose Tracking  No doses have been documented on this patient for the following tracked chemicals: Doxorubicin, Epirubicin, Idarubicin, Daunorubicin, Mitoxantrone, Bleomycin, Oxaliplatin, Carboplatin, Liposomal Doxorubicin  o   Toxicity Monitoring/Prevention: . The patient has the following take home antiemetics prescribed: Ondansetron and Prochlorperazine . The patient has the following take home medications prescribed: N/A . Medication allergies and previous infusion related reactions, if applicable, have been reviewed and addressed. Yes . The patient's current medication list has been assessed for drug-drug interactions  with their chemotherapy regimen. no significant drug-drug interactions were identified on review.  Order Review: . Are the treatment plan orders signed? No . Is the patient scheduled to see a provider prior to their treatment? No  I verify that I have reviewed each item in the above checklist and answered each question accordingly.  Win Guajardo K 05/04/2020 1:10 PM

## 2020-05-05 ENCOUNTER — Other Ambulatory Visit: Payer: Self-pay

## 2020-05-05 ENCOUNTER — Inpatient Hospital Stay: Payer: Medicaid Other | Attending: Hematology and Oncology

## 2020-05-05 DIAGNOSIS — E119 Type 2 diabetes mellitus without complications: Secondary | ICD-10-CM | POA: Insufficient documentation

## 2020-05-05 DIAGNOSIS — Z9049 Acquired absence of other specified parts of digestive tract: Secondary | ICD-10-CM | POA: Insufficient documentation

## 2020-05-05 DIAGNOSIS — D6481 Anemia due to antineoplastic chemotherapy: Secondary | ICD-10-CM | POA: Insufficient documentation

## 2020-05-05 DIAGNOSIS — E785 Hyperlipidemia, unspecified: Secondary | ICD-10-CM | POA: Insufficient documentation

## 2020-05-05 DIAGNOSIS — Z5111 Encounter for antineoplastic chemotherapy: Secondary | ICD-10-CM | POA: Insufficient documentation

## 2020-05-05 DIAGNOSIS — E8581 Light chain (AL) amyloidosis: Secondary | ICD-10-CM | POA: Insufficient documentation

## 2020-05-05 DIAGNOSIS — Z5112 Encounter for antineoplastic immunotherapy: Secondary | ICD-10-CM | POA: Insufficient documentation

## 2020-05-05 DIAGNOSIS — Z7982 Long term (current) use of aspirin: Secondary | ICD-10-CM | POA: Insufficient documentation

## 2020-05-05 DIAGNOSIS — T451X5A Adverse effect of antineoplastic and immunosuppressive drugs, initial encounter: Secondary | ICD-10-CM | POA: Insufficient documentation

## 2020-05-05 DIAGNOSIS — D472 Monoclonal gammopathy: Secondary | ICD-10-CM | POA: Insufficient documentation

## 2020-05-05 DIAGNOSIS — Z79899 Other long term (current) drug therapy: Secondary | ICD-10-CM | POA: Insufficient documentation

## 2020-05-05 DIAGNOSIS — I5042 Chronic combined systolic (congestive) and diastolic (congestive) heart failure: Secondary | ICD-10-CM | POA: Insufficient documentation

## 2020-05-05 DIAGNOSIS — Z8673 Personal history of transient ischemic attack (TIA), and cerebral infarction without residual deficits: Secondary | ICD-10-CM | POA: Insufficient documentation

## 2020-05-05 DIAGNOSIS — N183 Chronic kidney disease, stage 3 unspecified: Secondary | ICD-10-CM | POA: Insufficient documentation

## 2020-05-09 ENCOUNTER — Other Ambulatory Visit: Payer: Self-pay | Admitting: General Surgery

## 2020-05-09 DIAGNOSIS — D472 Monoclonal gammopathy: Secondary | ICD-10-CM | POA: Diagnosis not present

## 2020-05-09 DIAGNOSIS — E8589 Other amyloidosis: Secondary | ICD-10-CM | POA: Diagnosis not present

## 2020-05-11 ENCOUNTER — Inpatient Hospital Stay: Payer: Medicaid Other

## 2020-05-11 ENCOUNTER — Ambulatory Visit: Payer: Medicaid Other | Admitting: Hematology and Oncology

## 2020-05-11 ENCOUNTER — Other Ambulatory Visit: Payer: Self-pay | Admitting: Hematology and Oncology

## 2020-05-11 ENCOUNTER — Other Ambulatory Visit: Payer: Self-pay

## 2020-05-11 ENCOUNTER — Ambulatory Visit: Payer: Medicaid Other

## 2020-05-11 VITALS — BP 131/75 | HR 65 | Temp 98.0°F | Resp 18 | Wt 125.2 lb

## 2020-05-11 DIAGNOSIS — N183 Chronic kidney disease, stage 3 unspecified: Secondary | ICD-10-CM | POA: Diagnosis not present

## 2020-05-11 DIAGNOSIS — I5042 Chronic combined systolic (congestive) and diastolic (congestive) heart failure: Secondary | ICD-10-CM | POA: Diagnosis not present

## 2020-05-11 DIAGNOSIS — E8581 Light chain (AL) amyloidosis: Secondary | ICD-10-CM | POA: Diagnosis not present

## 2020-05-11 DIAGNOSIS — T451X5A Adverse effect of antineoplastic and immunosuppressive drugs, initial encounter: Secondary | ICD-10-CM | POA: Diagnosis not present

## 2020-05-11 DIAGNOSIS — D6481 Anemia due to antineoplastic chemotherapy: Secondary | ICD-10-CM | POA: Diagnosis not present

## 2020-05-11 DIAGNOSIS — D472 Monoclonal gammopathy: Secondary | ICD-10-CM

## 2020-05-11 DIAGNOSIS — Z5111 Encounter for antineoplastic chemotherapy: Secondary | ICD-10-CM | POA: Diagnosis not present

## 2020-05-11 DIAGNOSIS — Z9049 Acquired absence of other specified parts of digestive tract: Secondary | ICD-10-CM | POA: Diagnosis not present

## 2020-05-11 DIAGNOSIS — E119 Type 2 diabetes mellitus without complications: Secondary | ICD-10-CM | POA: Diagnosis not present

## 2020-05-11 DIAGNOSIS — E854 Organ-limited amyloidosis: Secondary | ICD-10-CM

## 2020-05-11 DIAGNOSIS — Z5112 Encounter for antineoplastic immunotherapy: Secondary | ICD-10-CM | POA: Diagnosis not present

## 2020-05-11 DIAGNOSIS — Z79899 Other long term (current) drug therapy: Secondary | ICD-10-CM | POA: Diagnosis not present

## 2020-05-11 DIAGNOSIS — Z8673 Personal history of transient ischemic attack (TIA), and cerebral infarction without residual deficits: Secondary | ICD-10-CM | POA: Diagnosis not present

## 2020-05-11 DIAGNOSIS — E785 Hyperlipidemia, unspecified: Secondary | ICD-10-CM | POA: Diagnosis not present

## 2020-05-11 DIAGNOSIS — I43 Cardiomyopathy in diseases classified elsewhere: Secondary | ICD-10-CM

## 2020-05-11 DIAGNOSIS — Z7982 Long term (current) use of aspirin: Secondary | ICD-10-CM | POA: Diagnosis not present

## 2020-05-11 LAB — CMP (CANCER CENTER ONLY)
ALT: 19 U/L (ref 0–44)
AST: 54 U/L — ABNORMAL HIGH (ref 15–41)
Albumin: 3.5 g/dL (ref 3.5–5.0)
Alkaline Phosphatase: 189 U/L — ABNORMAL HIGH (ref 38–126)
Anion gap: 9 (ref 5–15)
BUN: 30 mg/dL — ABNORMAL HIGH (ref 8–23)
CO2: 21 mmol/L — ABNORMAL LOW (ref 22–32)
Calcium: 9.1 mg/dL (ref 8.9–10.3)
Chloride: 104 mmol/L (ref 98–111)
Creatinine: 1.53 mg/dL — ABNORMAL HIGH (ref 0.44–1.00)
GFR, Est AFR Am: 38 mL/min — ABNORMAL LOW (ref >60)
GFR, Estimated: 32 mL/min — ABNORMAL LOW (ref >60)
Glucose, Bld: 192 mg/dL — ABNORMAL HIGH (ref 70–99)
Potassium: 3.8 mmol/L (ref 3.5–5.1)
Sodium: 134 mmol/L — ABNORMAL LOW (ref 135–145)
Total Bilirubin: 3 mg/dL — ABNORMAL HIGH (ref 0.3–1.2)
Total Protein: 8.7 g/dL — ABNORMAL HIGH (ref 6.5–8.1)

## 2020-05-11 LAB — CBC WITH DIFFERENTIAL (CANCER CENTER ONLY)
Abs Immature Granulocytes: 0.04 10*3/uL (ref 0.00–0.07)
Basophils Absolute: 0.1 10*3/uL (ref 0.0–0.1)
Basophils Relative: 2 %
Eosinophils Absolute: 0.1 10*3/uL (ref 0.0–0.5)
Eosinophils Relative: 3 %
HCT: 33.4 % — ABNORMAL LOW (ref 36.0–46.0)
Hemoglobin: 11.4 g/dL — ABNORMAL LOW (ref 12.0–15.0)
Immature Granulocytes: 1 %
Lymphocytes Relative: 21 %
Lymphs Abs: 0.8 10*3/uL (ref 0.7–4.0)
MCH: 35.4 pg — ABNORMAL HIGH (ref 26.0–34.0)
MCHC: 34.1 g/dL (ref 30.0–36.0)
MCV: 103.7 fL — ABNORMAL HIGH (ref 80.0–100.0)
Monocytes Absolute: 0.5 10*3/uL (ref 0.1–1.0)
Monocytes Relative: 13 %
Neutro Abs: 2.2 10*3/uL (ref 1.7–7.7)
Neutrophils Relative %: 60 %
Platelet Count: 134 10*3/uL — ABNORMAL LOW (ref 150–400)
RBC: 3.22 MIL/uL — ABNORMAL LOW (ref 3.87–5.11)
RDW: 14.4 % (ref 11.5–15.5)
WBC Count: 3.7 10*3/uL — ABNORMAL LOW (ref 4.0–10.5)
nRBC: 0 % (ref 0.0–0.2)

## 2020-05-11 MED ORDER — BORTEZOMIB CHEMO SQ INJECTION 3.5 MG (2.5MG/ML)
1.5000 mg/m2 | Freq: Once | INTRAMUSCULAR | Status: AC
Start: 2020-05-11 — End: 2020-05-11
  Administered 2020-05-11: 2.25 mg via SUBCUTANEOUS
  Filled 2020-05-11: qty 0.9

## 2020-05-11 MED ORDER — PALONOSETRON HCL INJECTION 0.25 MG/5ML
0.2500 mg | Freq: Once | INTRAVENOUS | Status: AC
  Administered 2020-05-11: 0.25 mg via INTRAVENOUS

## 2020-05-11 MED ORDER — SODIUM CHLORIDE 0.9 % IV SOLN
Freq: Once | INTRAVENOUS | Status: AC
  Filled 2020-05-11: qty 250

## 2020-05-11 MED ORDER — ACYCLOVIR 400 MG PO TABS: 400.0000 mg | ORAL_TABLET | Freq: Every day | ORAL | 3 refills | Status: AC

## 2020-05-11 MED ORDER — PALONOSETRON HCL INJECTION 0.25 MG/5ML
INTRAVENOUS | Status: AC
  Filled 2020-05-11: qty 5

## 2020-05-11 MED ORDER — SODIUM CHLORIDE 0.9 % IV SOLN
300.0000 mg/m2 | Freq: Once | INTRAVENOUS | Status: AC
  Administered 2020-05-11: 460 mg via INTRAVENOUS
  Filled 2020-05-11: qty 23

## 2020-05-11 MED ORDER — DEXAMETHASONE SODIUM PHOSPHATE 100 MG/10ML IJ SOLN
40.0000 mg | Freq: Once | INTRAMUSCULAR | Status: AC
Start: 2020-05-11 — End: 2020-05-11
  Administered 2020-05-11: 40 mg via INTRAVENOUS
  Filled 2020-05-11: qty 4

## 2020-05-11 NOTE — Progress Notes (Signed)
Per Dr Lorenso Courier, ok to proceed without waiting on CMP results. CMP to be drawn while patient is getting treatment today.

## 2020-05-11 NOTE — Patient Instructions (Signed)
[facility] Discharge Instructions for Patients Receiving Chemotherapy  Today you received the following chemotherapy agents: bortezomib and cyclophosphamide.  To help prevent nausea and vomiting after your treatment, we encourage you to take your nausea medication as directed.   If you develop nausea and vomiting that is not controlled by your nausea medication, call the clinic.   BELOW ARE SYMPTOMS THAT SHOULD BE REPORTED IMMEDIATELY:  *FEVER GREATER THAN 100.5 F  *CHILLS WITH OR WITHOUT FEVER  NAUSEA AND VOMITING THAT IS NOT CONTROLLED WITH YOUR NAUSEA MEDICATION  *UNUSUAL SHORTNESS OF BREATH  *UNUSUAL BRUISING OR BLEEDING  TENDERNESS IN MOUTH AND THROAT WITH OR WITHOUT PRESENCE OF ULCERS  *URINARY PROBLEMS  *BOWEL PROBLEMS  UNUSUAL RASH Items with * indicate a potential emergency and should be followed up as soon as possible.  Feel free to call the clinic should you have any questions or concerns. The clinic phone number is [phone].  Please show the [place] at check-in to the Emergency Department and triage nurse.  Bortezomib injection ?y l thu?c g? BORTEZOMIB l thu?c nh?m ??n cc protein trong cc t? bo ung th? v lm cho t? bo ung th? ng?ng pht tri?n. Thu?c ???c dng ?? ?i?u tr? b?nh ?a u t?y (multiple myeloma) v b??u b?ch huy?t t? 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 Thu?c ny c th? ???c dng cho nh?ng m?c ?ch khc; hy h?i ng??i cung c?p d?ch v? y t? ho?c d??c s? c?a mnh, n?u qu v? c th?c m?c. (CC) NHN HI?U PH? BI?N: Velcade Ti c?n ph?i bo cho ng??i cung c?p d?ch v? y t? c?a mnh ?i?u g tr??c khi dng thu?c ny? H? c?n bi?t li?u qu v? c b?t k? tnh tr?ng no sau ?y khng:  b?nh ti?u ???ng  b?nh tim  nh?p tim khng ??u  b?nh gan  ?ang ???c th?m phn  s? l??ng t? bo mu th?p, ch?ng h?n nh? s? l??ng b?ch c?u, ti?u c?u, ho?c h?ng c?u th?p  b?nh th?n kinh ngo?i bin  ?ang dng thu?c ?i?u tr? huy?t p  cao  pha?n ??ng b?t th???ng ho??c di? ??ng v??i bortezomib, mannitol, ho?c boron  pha?n ??ng b?t th???ng ho??c di? ??ng v??i ca?c d??c ph?m kha?c, th?c ph?m, thu?c nhu?m, ho??c ch?t ba?o qua?n  ?ang c thai ho??c ??nh co? thai  ?ang cho con bu? Ti nn s? d?ng thu?c ny nh? th? no? Thu?c ny ?? tim d??i da ho?c tim vo t?nh m?ch. Thu?c ny ???c s? d?ng b?i chuyn vin y t? ? b?nh vi?n ho?c ? phng m?ch. Hy bn v?i bc s? nhi khoa c?a qu v? v? vi?c dng thu?c ny ? tr? em. C th? c?n ch?m [place]  ??c bi?t. Qu li?u: N?u qu v? cho r?ng mnh ? dng qu nhi?u thu?c ny, th hy lin l?c v?i trung tm ki?m sot ch?t ??c ho?c phng c?p c?u ngay l?p t?c. L?U : Thu?c ny ch? dnh ring cho qu v?. Khng chia s? thu?c ny v?i nh?ng ng??i khc. N?u ti l? qun m?t li?u th sao? ?i?u quan tr?ng l khng nn b? l? li?u thu?c no. Hy lin l?c v?i bc s? ho?c [place] vin y t? c?a mnh, n?u qu v? khng th? gi? ?ng cu?c h?n khm. Nh?ng g c th? t??ng tc v?i thu?c ny? Thu?c ny c th? t??ng tc v?i cc thu?c sau ?y:  ketoconazole  rifampicin  ritonavir  cy [facility] (c? [facility]/cy n?c s?i/cy l?nh) Danh sch  ny c th? khng m t? ?? h?t cc t??ng tc c th? x?y ra. Hy ??a cho ng??i cung c?p d?ch v? y t? c?a mnh danh sch t?t c? cc thu?c, th?o d??c, cc thu?c khng c?n toa, ho?c cc ch? ph?m b? sung m qu v? dng. C?ng nn bo cho h? bi?t r?ng qu v? c ht thu?c, u?ng r??u, ho?c c s? d?ng ma ty tri php hay khng. Vi th? c th? t??ng tc v?i thu?c c?a qu v?. Ti c?n ph?i theo di ?i?u g trong khi dng thu?c ny? Qu v? c th? b? bu?n ng? ho?c chng m?t. Khng ???c li xe, s? d?ng my mc, ho?c lm nh?ng vi?c c?n ph?i t?nh to cho t?i khi qu v? bi?t ???c thu?c ny ?nh h??ng ln qu v? nh? th? no. Khng ???c ng?i d?y ho?c ??ng d?y nhanh, ??c bi?t l khi qu v? l b?nh nhn l?n tu?i. ?i?u ny lm gi?m nguy c? b? chng m?t ho?c ng?t x?u. Trong m?t s? tr??ng h?p, qu v?  c th? ???c cho dng cc thu?c ph? thm ?? gip gi?m tc d?ng ph?. Hy lm theo t?t c? cc h??ng d?n v? vi?c s? d?ng chng. Hy h?i  ki?n bc s? ho?c chuyn vin y t?, n?u qu v? b? s?t, ?n l?nh ho?c ?au h?ng, ho?c c cc tri?u ch?ng khc c?a c?m l?nh ho?c cm. Khng ???c t? ?i?u tr? cho mnh. Thu?c ny c th? lm gi?m kh? n?ng ch?ng l?i cc b?nh nhi?m trng c?a c? th?. Hy c? trnh ? g?n nh?ng ng??i b? b?nh. Thu?c ny c th? lm t?ng nguy c? b? b?m tm ho?c ch?y mu. Hy lin l?c v?i bc s? ho?c chuyn vin y t?, n?u qu v? th?y ch?y mu b?t th??ng. Qu v? s? c?n ?i lm cc xt nghi?m mu ??nh k? trong khi qu v? dng thu?c ny. ? m?t s? b?nh nhn, thu?c ny c th? gy ra nhi?m trng no nghim tr?ng c th? d?n ??n t? vong. N?u qu v? c b?t k? v?n ?? no v? kh? n?ng nhn, suy ngh?, ni, ?i l?i ho?c ??ng, th hy bo ngay cho bc s?. N?u qu v? khng th? lin l?c ???c v?i bc s?, thi? ha?y tm ngay n?i ch?m [place] y t? khc. Hy lin l?c v?i bc s? ho?c chuyn vin y t? n?u qu v? b? tiu ch?y n?ng, bu?n i v i m?a, ho?c n?u qu v? b? ra nhi?u m? hi. Tnh tr?ng m?t qu nhi?u d?ch c? th? c th? gy nguy hi?m khi qu v? dng thu?c ny. Khng ???c c thai trong khi dng thu?c ny ho?c trong vng t?i thi?u 7 thng sau khi ng?ng dng thu?c. Ph? n? c?n ph?i thng bo cho bc s? c?a mnh, n?u mu?n c thai ho?c ngh? r?ng c th? mnh ? c [place]. Nam gi??i khng nn gy thu? thai trong khi u?ng thu?c na?y va? trong vng t?i thi?u 4 tha?ng sau khi ng?ng u?ng thu?c. C nguy c? v? cc tc d?ng ph? nghim tr?ng ??i v?i [place] nhi. Hy th?o lu?n v?i bc s? ho?c chuyn vin y t? ho?c d??c s? ?? bi?t thm thng tin. Khng ???c cho tr? b m? khi ?ang dng thu?c ny, ho?c trong vng 2 thng sau khi ng?ng dng thu?c. Thu?c na?y co? th? c?n tr? kha? n?ng co? con. Quy? vi? nn th?o lu?n v?i bc s? ho?c [place] vin y t? c?a mnh, n?u qu v? lo l??ng v? kha?  n?ng thu? tinh. Ti c th? nh?n th?y nh?ng tc d?ng ph? no khi dng  thu?c ny? Nh?ng tc d?ng ph? qu v? c?n ph?i bo cho bc s? ho?c chuyn vin y t? cng s?m cng t?t:  cc ph?n ?ng d? ?ng, ch?ng h?n nh? da b? m?n ??, ng?a, n?i my ?ay, s?ng ? m?t, mi, ho?c l??i  kh th?  thay ??i thnh l?c  thay ??i th? l?c  tim ??p nhanh ho?c khng ??u  c?m th?y chong vng, ng?t x?u, b? t  ?au, t ho?c c?m gic nh? b? ki?n b ? bn tay ho?c bn chn  ?au ? vng b?ng trn bn ph?i  co gi?t (kinh phong)  s?ng ? m?t c chn, bn chn, ho?c bn tay  b? b?m tm ho?c xu?t huy?t b?t th??ng  m?t m?i ho?c y?u ?t b?t th??ng  i m?a  vng da ho?c m?t Cc tc d?ng ph? khng c?n ph?i ch?m [place] y t? (hy bo cho bc s? ho?c chuyn vin y t?, n?u cc tc d?ng ph? ny ti?p di?n ho?c gy phi?n toi):  cc thay ??i c?m xc ho?c tm tr?ng  to bn  tiu ch?y  m?t c?m gic ngon mi?ng  ?au ??u  kch ?ng ? ch? tim  bu?n i [facility] sch ny c th? khng m t? ?? h?t cc tc d?ng ph? c th? x?y ra. Xin g?i t?i bc s? c?a mnh ?? ???c c? v?n chuyn mn v? cc tc d?ng ph?Sander Nephew v? c th? t??ng trnh cc tc d?ng ph? cho FDA theo s? [phone]. Ti nn c?t gi? thu?c c?a mnh ? ?u? Thu?c ny ???c s? d?ng b?i chuyn vin y t? ? b?nh vi?n ho?c ? phng m?ch. Qu v? s? khng ???c c?p thu?c ny ?? c?t gi? t?i nh. L?U : ?y l b?n tm t?t. N c th? khng bao hm t?t c? thng tin c th? c. N?u qu v? th?c m?c v? thu?c ny, xin trao ??i v?i bc s?, d??c s?, ho?c ng??i cung c?p d?ch v? y t? c?a mnh.  2020 Elsevier/Gold Standard (2018-02-02 00:00:00)  Cyclophosphamide Injection ?y l thu?c g? CYCLOPHOSPHAMIDE l thu?c ha tr? li?u. N lm ch?m s? pht tri?n c?a cc t? bo ung th?. Thu?c ny ???c dng ?? ?i?u tr? nhi?u lo?i ung th?, ch?ng h?n nh? b?nh b??u b?ch huy?t, b??u t?y, b?nh b?ch c?u c tnh, ung th? v, v ung th? bu?ng tr?ng. Thu?c ny c th? ???c dng cho nh?ng m?c ?ch khc; hy h?i ng??i cung c?p d?ch v? y t? ho?c d??c s? c?a mnh, n?u qu v? c th?c  m?c. (CC) NHN HI?U PH? BI?N: Cytoxan, Neosar Ti c?n ph?i bo cho ng??i cung c?p d?ch v? y t? c?a mnh ?i?u g tr??c khi dng thu?c ny? H? c?n bi?t li?u qu v? c b?t k? tnh tr?ng no sau ?y khng:  cc r?i lo?n v? mu  ti?n s? ha tr? li?u khc  nhi?m trng  b?nh th?n  b?nh gan  m?i x? tr? g?n ?y ho?c ?ang x? tr?  u b??u trong t?y x??ng  pha?n ??ng b?t th???ng ho??c di? ??ng v??i cyclophosphamide ho?c cc thu?c ha tr? li?u khc  pha?n ??ng b?t th???ng ho??c di? ??ng v??i ca?c d??c ph?m kha?c, th?c ph?m, thu?c nhu?m, ho??c ch?t ba?o qua?n  ?ang c thai ho??c ??nh co? thai  ?ang cho con bu? Ti nn s? d?ng thu?c ny nh? th? no? Thu?c ny ?? tim vo  t?nh m?ch ho?c vo b?p th?t, ho?c ?? truy?n vo t?nh m?ch. Thu?c ny ???c cho trong b?nh vi?n ho?c phng m?ch b?i chuyn vin y t? ???c hu?n luy?n ??c bi?t. Hy bn v?i bc s? nhi khoa c?a qu v? v? vi?c dng thu?c ny ? tr? em. C th? c?n ch?m [place] ??c bi?t. Qu li?u: N?u qu v? cho r?ng mnh ? dng qu nhi?u thu?c ny, th hy lin l?c v?i trung tm ki?m sot ch?t ??c ho?c phng c?p c?u ngay l?p t?c. L?U : Thu?c ny ch? dnh ring cho qu v?. Khng chia s? thu?c ny v?i nh?ng ng??i khc. N?u ti l? qun m?t li?u th sao? ?i?u quan tr?ng l khng nn b? l? li?u thu?c no. Hy lin l?c v?i bc s? ho?c [place] vin y t? c?a mnh, n?u qu v? khng th? gi? ?ng cu?c h?n khm. Nh?ng g c th? t??ng tc v?i thu?c ny? Thu?c ny c th? t??ng tc v?i cc thu?c sau ?y:  amiodarone  amphotericin B  azathioprine  m?t s? thu?c khng virus dng ?? tr? nhi?m HIV ho?c AIDS nh? cc thu?c ?c ch? men protease (protease inhibitor) (v d? nh? indinavir, ritonavir) v zidovudine  m?t s? thu?c dng ?? tr? huy?t p cao nh? benazepril, captopril, enalapril, fosinopril, lisinopril, moexipril, monopril, perindopril, quinapril, ramipril, trandolapril  m?t s? thu?c tr? ung th? nh? cc thu?c anthracycline (v d? nh? daunorubicin,  doxorubicin), busulfan, cytarabine, paclitaxel, pentostatin, tamoxifen, trastuzumab  m?t s? thu?c l?i ti?u nh? chlorothiazide, chlorthalidone, hydrochlorothiazide, indapamide, metolazone  m?t s? thu?c ?i?u tr? ho?c phng ng?a c?c mu ?ng, ch?ng h?n nh? warfarin  m?t s? thu?c gin c? b?p ch?ng h?n nh? succinylcholine  cyclosporine  etanercept  indomethacin  m?t s? thu?c lm t?ng s? l??ng t? bo mu, ch?ng h?n nh? filgrastim, pegfilgrastim, sargramostim  cc thu?c dng ?? gy m ton thn  metronidazole  natalizumab [facility] sch ny c th? khng m t? ?? h?t cc t??ng tc c th? x?y ra. Hy ??a cho ng??i cung c?p d?ch v? y t? c?a mnh danh sch t?t c? cc thu?c, th?o d??c, cc thu?c khng c?n toa, ho?c cc ch? ph?m b? sung m qu v? dng. C?ng nn bo cho h? bi?t r?ng qu v? c ht thu?c, u?ng r??u, ho?c c s? d?ng ma ty tri php hay khng. Vi th? c th? t??ng tc v?i thu?c c?a qu v?. Ti c?n ph?i theo di ?i?u g trong khi dng thu?c ny? Hy ?i g?p bc s? ho?c [place] vin y t? ?? theo di ??nh k? s? c?i thi?n c?a qu v?. Thu?c ny c th? lm cho qu v? c?m th?y khng ???c kh?e nh? th??ng l?. ?i?u ny khng ph?i khng ph? bi?n, b?i v thu?c ha tr? li?u c th? ?nh h??ng ??n c? t? bo lnh l?n t? bo ung th?. Hy t??ng trnh m?i tc d?ng ph?. Hy ti?p t?c ??t ?i?u tr? c?a mnh ngay c? khi qu v? c?m th?y m?t, tr? khi bc s? yu c?u qu v? ng?ng ?i?u tr?. Hy u?ng n??c ho?c ch?t l?ng nh? ? ???c ch? d?n. Hy ?i ti?u th??ng xuyn, k? c? ban ?m. Trong m?t s? tr??ng h?p, qu v? c th? ???c cho dng cc thu?c ph? thm ?? gip gi?m tc d?ng ph?. Hy lm theo t?t c? cc h??ng d?n v? vi?c s? d?ng chng. Hy h?i  ki?n bc s? ho?c chuyn vin y t?, n?u qu v? b? s?t, ?n l?nh ho?c ?au h?ng, ho?c  c cc tri?u ch?ng khc c?a c?m l?nh ho?c cm. Khng ???c t? ?i?u tr? cho mnh. Thu?c ny c th? lm gi?m kh? n?ng ch?ng l?i cc b?nh nhi?m trng c?a c? th?. Hy c? trnh ? g?n nh?ng ng??i b? b?nh. Thu?c  ny c th? lm t?ng nguy c? b? b?m tm ho?c ch?y mu. Hy lin l?c v?i bc s? ho?c chuyn vin y t?, n?u qu v? th?y ch?y mu b?t th??ng. Hy c?n th?n khi ?nh r?ng ho?c x?a r?ng b?ng ch? nha khoa ho?c b?ng t?m, b?i v qu v? c th? d? b? nhi?m trng ho?c d? b? ch?y mu h?n. N?u qu v? c ?i lm r?ng, th hy bo v?i nha s? r?ng qu v? ?ang dng thu?c ny. Qu v? c th? b? bu?n ng? ho?c chng m?t. Khng ???c li xe, s? d?ng my mc, ho?c lm nh?ng vi?c c?n ph?i t?nh to cho t?i khi qu v? bi?t ???c thu?c ny ?nh h??ng ln qu v? nh? th? no. Khng ???c c thai trong th?i gian u?ng thu?c ny ho?c trong vng 1 n?m sau khi ng?ng dng thu?c. Ph? n? c?n ph?i thng bo cho bc s? c?a mnh, n?u mu?n c thai ho?c ngh? r?ng c th? mnh ? c [place]. Nam gi??i khng nn gy thu? thai trong khi du?ng thu?c na?y va? trong vng 4 tha?ng sau khi ng?ng thu?c. C nguy c? v? cc tc d?ng ph? nghim tr?ng ??i v?i [place] nhi. Hy th?o lu?n v?i bc s? ho?c chuyn vin y t? ho?c d??c s? ?? bi?t thm thng tin. Khng ???c nui con b?ng s?a m? trong khi dng thu?c ny. Thu?c na?y co? th? c?n tr? kha? n?ng co? con. Thu?c ny ?a? gy suy bu?ng tr??ng ?? m?t s? phu? n??. Nam gi?i c th? b? gi?m s? l??ng tinh trng trong khi dng thu?c ny. Quy? vi? nn th?o lu?n v?i bc s? ho?c [place] vin y t? c?a mnh, n?u qu v? lo l??ng v? kha? n?ng thu? tinh. N?u qu v? s?p ???c gi?i ph?u, th hy bo cho bc s? ho?c chuyn vin y t? bi?t r?ng qu v? ? dng thu?c ny. Ti c th? nh?n th?y nh?ng tc d?ng ph? no khi dng thu?c ny? Nh?ng tc d?ng ph? qu v? c?n ph?i bo cho bc s? ho?c chuyn vin y t? cng s?m cng t?t:  cc ph?n ?ng d? ?ng, ch?ng h?n nh? da b? m?n ??, ng?a, n?i my ?ay, s?ng ? m?t, mi, ho?c l??i  gi?m s? l??ng t? bo mu - thu?c ny c th? lm gi?m s? l??ng t? bo b?ch c?u, t? bo h?ng c?u v ti?u c?u. Qu v? c th? c nguy c? cao b? nhi?m trng v xu?t huy?t.  cc d?u hi?u nhi?m trng - s?t ho?c ?n l?nh, ho, ?au  h?ng, kh ?i ti?u ho?c ?i ti?u ?au  cc d?u hi?u gi?m ti?u c?u ho?c xu?t huy?t - b?m tm, cc n?t l?m t?m ?? trn da, phn c mu ?en, mu h?c n, c mu trong n??c ti?u  cc d?u hi?u gi?m s? l??ng t? bo h?ng c?u - c?m th?y y?u ?t ho?c m?t m?i m?t cch b?t th??ng, cc c?n ng?t x?u, chong vng  kh th?  n??c ti?u s?m mu  chng m?t  ?nh tr?ng ng?c  s?ng ? m?t c chn, bn chn, ho?c bn tay  kh ?i ti?u ho?c thay ??i l??ng n??c ti?u ???c bi ti?t  t?ng cn  vng da ho?c m?t Cc  tc d?ng ph? khng c?n ph?i ch?m [place] y t? (hy bo cho bc s? ho?c chuyn vin y t?, n?u cc tc d?ng ph? ny ti?p di?n ho?c gy phi?n toi):  cc thay ??i mu mng ho?c da  r?ng tc  khng th?y kinh nguy?t  lot mi?ng  bu?n i ho?c i m?a [facility] sch ny c th? khng m t? ?? h?t cc tc d?ng ph? c th? x?y ra. Xin g?i t?i bc s? c?a mnh ?? ???c c? v?n chuyn mn v? cc tc d?ng ph?Sander Nephew v? c th? t??ng trnh cc tc d?ng ph? cho FDA theo s? [phone]. Ti nn c?t gi? thu?c c?a mnh ? ?u? Thu?c ny ???c s? d?ng b?i chuyn vin y t? ? b?nh vi?n ho?c ? phng m?ch. Qu v? s? khng ???c c?p thu?c ny ?? c?t gi? t?i nh. L?U : ?y l b?n tm t?t. N c th? khng bao hm t?t c? thng tin c th? c. N?u qu v? th?c m?c v? thu?c ny, xin trao ??i v?i bc s?, d??c s?, ho?c ng??i cung c?p d?ch v? y t? c?a mnh.  2020 Elsevier/Gold Standard (2016-09-19 00:00:00)

## 2020-05-11 NOTE — Progress Notes (Signed)
Confirmed with Dr. Lorenso Courier that patient should be on VZV prophylaxis while on Velcade. Prescription for acyclovir 400 mg daily sent to [facility] on [facility]. RN in infusion clinic aware to utilize interpreter to communicate that the prescription was sent and how/why to take it.   Eddie Candle, PharmD PGY-2 Hematology/Oncology Pharmacy Resident

## 2020-05-12 ENCOUNTER — Telehealth: Payer: Self-pay | Admitting: *Deleted

## 2020-05-16 ENCOUNTER — Other Ambulatory Visit: Payer: Self-pay | Admitting: Hematology and Oncology

## 2020-05-16 DIAGNOSIS — D472 Monoclonal gammopathy: Secondary | ICD-10-CM

## 2020-05-16 DIAGNOSIS — I43 Cardiomyopathy in diseases classified elsewhere: Secondary | ICD-10-CM

## 2020-05-16 NOTE — Progress Notes (Signed)
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 Ordered during evaluation for cardiomyopathy.  2) 03/13/2020: establish care with Dr. 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 The patient's last visit was on 04/24/2020. In the interim since the last visit she has started treatment with CyBorD chemotherapy.   On exam today communication with the patient is assisted by an approved hospital [place] interpreter. Mikayla Reynolds is considerably more talkative than usual.  She notes that the first 3 days after therapy she was doing "excellent" and that after the third and fourth day she became progressively more weak.  She reports that after treatment she was eating more and was more active, but that wore off after a few days.  She reports that she is not had any issues with nausea, vomiting, or diarrhea.  She notes that she is not experiencing any neuropathy at this time.  She denies having any issues with fevers, chills, sweats.  A full 10 point ROS is listed below.  MEDICAL HISTORY:  Past Medical  History:  Diagnosis Date   Abnormal liver function    Anemia, unspecified    Chronic combined systolic and diastolic CHF (congestive heart failure) (HCC)    CKD (chronic kidney disease), stage III 02/19/2018   Elevated troponin 12/27/2017   Hyperlipidemia    Junctional bradycardia    Mitral regurgitation    Pre-diabetes    Renal mass    Stroke [facility])     SURGICAL HISTORY: Past Surgical History:  Procedure Laterality Date   abdominal wall surgery  2017   APPENDECTOMY     LOOP RECORDER INSERTION N/A 06/23/2018   Procedure: LOOP RECORDER INSERTION;  Surgeon: Hillis Range, MD;  Location: [facility] INVASIVE CV LAB;  Service: Cardiovascular;  Laterality: N/A;   TEE WITHOUT CARDIOVERSION N/A 06/23/2018   Procedure: TRANSESOPHAGEAL ECHOCARDIOGRAM (TEE);  Surgeon: Quintella Reichert, MD;  Location: [facility] ENDOSCOPY;  Service: Cardiovascular;  Laterality: N/A;    SOCIAL HISTORY: Social History   Socioeconomic History   Marital status: Widowed    Spouse name: Not on file   Number of children: Not on file   Years of education: Not on file   Highest education level: Not on file  Occupational History   Not on file  Tobacco Use   Smoking status: Never Smoker   Smokeless tobacco: Never Used  Vaping Use   Vaping Use: Never used  Substance and Sexual Activity   Alcohol use: Not Currently   Drug use: Not Currently   Sexual activity: Not Currently  Other Topics Concern   Not on file  Social History Narrative   Not on file   Social Determinants of Health   Financial Resource Strain:    Difficulty of Paying Living  Expenses: Not on file  Food Insecurity:    Worried About Charity fundraiser in the Last Year: Not on file   [employer] of Food in the Last Year: Not on file  Transportation Needs:    Lack of Transportation (Medical): Not on file   Lack of Transportation (Non-Medical): Not on file  Physical Activity:    Days of Exercise per Week: Not on file    Minutes of Exercise per Session: Not on file  Stress:    Feeling of Stress : Not on file  Social Connections:    Frequency of Communication with Friends and Family: Not on file   Frequency of Social Gatherings with Friends and Family: Not on file   Attends Religious Services: Not on file   Active Member of Clubs or Organizations: Not on file   Attends Archivist Meetings: Not on file   Marital Status: Not on file  Intimate Partner Violence:    Fear of Current or Ex-Partner: Not on file   Emotionally Abused: Not on file   Physically Abused: Not on file   Sexually Abused: Not on file    FAMILY HISTORY: Family History  Problem Relation Age of Onset   Healthy Sister    Healthy Brother     ALLERGIES:  has No Known Allergies.  MEDICATIONS:  Current Outpatient Medications  Medication Sig Dispense Refill   acyclovir (ZOVIRAX) 400 MG tablet Take 1 tablet (400 mg total) by mouth daily. 30 tablet 3   aspirin EC 81 MG tablet Take 1 tablet (81 mg total) by mouth daily. Swallow whole. 90 tablet 3   atorvastatin (LIPITOR) 40 MG tablet Take 1 tablet (40 mg total) by mouth daily at 6 PM. 90 tablet 3   furosemide (LASIX) 20 MG tablet Take 1 tablet (20 mg total) by mouth every other day. 90 tablet 3   Methylcellulose, Laxative, (FIBER THERAPY) 500 MG TABS Take by mouth daily.     Polyethylene Glycol 3350 (MIRALAX PO) Take by mouth.     potassium chloride SA (KLOR-CON) 20 MEQ tablet Take 2 tablets by mouth for 2 days then hold 30 tablet 0   spironolactone (ALDACTONE) 25 MG tablet Take 0.5 tablets (12.5 mg total) by mouth daily. 45 tablet 3   No current facility-administered medications for this visit.    REVIEW OF SYSTEMS:   Constitutional: ( - ) fevers, ( - )  chills , ( - ) night sweats Eyes: ( - ) blurriness of vision, ( - ) double vision, ( - ) watery eyes Ears, nose, mouth, throat, and face: ( - ) mucositis, ( - ) sore throat Respiratory: ( - ) cough, ( -  ) dyspnea, ( - ) wheezes Cardiovascular: ( - ) palpitation, ( - ) chest discomfort, ( - ) lower extremity swelling Gastrointestinal:  ( - ) nausea, ( - ) heartburn, ( - ) change in bowel habits Skin: ( - ) abnormal skin rashes Lymphatics: ( - ) new lymphadenopathy, ( - ) easy bruising Neurological: ( - ) numbness, ( - ) tingling, ( - ) new weaknesses Behavioral/Psych: ( - ) mood change, ( - ) new changes  All other systems were reviewed with the patient and are negative.  PHYSICAL EXAMINATION: ECOG PERFORMANCE STATUS: 1 - Symptomatic but completely ambulatory  There were no vitals filed for this visit. There were no vitals filed for this visit.  GENERAL: well appearing elderly [place] female. alert, no distress and comfortable SKIN: skin  color, texture, turgor are normal, no rashes or significant lesions EYES: conjunctiva are pink and non-injected, sclera clear LUNGS: clear to auscultation and percussion with normal breathing effort HEART: regular rate & rhythm and no murmurs and no lower extremity edema Musculoskeletal: no cyanosis of digits and no clubbing  PSYCH: alert & oriented x 3, fluent speech NEURO: no focal motor/sensory deficits  LABORATORY DATA:  I have reviewed the data as listed CBC Latest Ref Rng & Units 05/11/2020 04/26/2020 03/23/2020  WBC 4.0 - 10.5 K/uL 3.7(L) 2.9(L) 3.5(L)  Hemoglobin 12.0 - 15.0 g/dL 11.4(L) 12.8 11.0(L)  Hematocrit 36 - 46 % 33.4(L) 38.1 33.1(L)  Platelets 150 - 400 K/uL 134(L) 153 139(L)    CMP Latest Ref Rng & Units 05/11/2020 04/26/2020 03/13/2020  Glucose 70 - 99 mg/dL 192(H) 88 98  BUN 8 - 23 mg/dL 30(H) 28(H) 20  Creatinine 0.44 - 1.00 mg/dL 1.53(H) 1.53(H) 1.50(H)  Sodium 135 - 145 mmol/L 134(L) 139 136  Potassium 3.5 - 5.1 mmol/L 3.8 3.9 4.3  Chloride 98 - 111 mmol/L 104 100 102  CO2 22 - 32 mmol/L 21(L) 31 24  Calcium 8.9 - 10.3 mg/dL 9.1 10.0 9.5  Total Protein 6.5 - 8.1 g/dL 8.7(H) 9.3(H) 9.6(H)  Total Bilirubin 0.3 - 1.2 mg/dL  3.0(H) 3.7(HH) 2.5(H)  Alkaline Phos 38 - 126 U/L 189(H) 180(H) 252(H)  AST 15 - 41 U/L 54(H) 51(H) 70(H)  ALT 0 - 44 U/L $Remo'19 16 31    'VqvDy$ Lab Results  Component Value Date   MPROTEIN 2.7 (H) 03/13/2020   MPROTEIN 2.4 (H) 03/01/2020   Lab Results  Component Value Date   KPAFRELGTCHN 23.8 (H) 03/13/2020   LAMBDASER 34.5 (H) 03/13/2020   KAPLAMBRATIO 7.27 03/23/2020   KAPLAMBRATIO 0.69 03/13/2020     PATHOLOGY: Surgical Pathology  CASE: [phone]  PATIENT: Neriyah Mccahill  Bone Marrow Report   Clinical History: None provided   DIAGNOSIS:   BONE MARROW, ASPIRATE, CLOT, CORE:  - Cellular marrow involved by a plasma cell neoplasm (20%)  - See comment and microscopic description below   PERIPHERAL BLOOD:  - Pancytopenia  - See complete blood cell count   COMMENT:   The bone marrow biopsy is involved by a plasma cell neoplasm, 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 Correlation with radiographic findings  and laboratory data is required to characterize this plasma cell  neoplasm. There was no amyloid detected on the core biopsy or clot  section; however, the sample was suboptimal for evaluation.   MICROSCOPIC DESCRIPTION:   PERIPHERAL BLOOD SMEAR: The peripheral blood has a macrocytic anemia,  leukopenia and thrombocytopenia. Red cell morphology shows mild  anisocytosis. There is no rouleaux formation. Leukocytes are  morphologically unremarkable. Circulating plasma cellsare not  identified.   BONE MARROW ASPIRATE: Spicular, cellular and adequate for evaluation  Erythroid precursors: Orderly maturation without overt dysplasia  Granulocytic precursors: Orderly maturation without overt dysplasia  Megakaryocytes: Qualitatively and quantitatively unremarkable  Lymphocytes/plasma cells: There are increased plasma cells (16% by  manual differential counts) some are enlarged with multi nucleated forms  present. There is no lymphocytosis.   TOUCH PREPARATIONS:  Markedly hypocellular with no additional findings as  compared to the aspirate smear   CLOT AND BIOPSY: Examination of the bone marrow core biopsy and clot  section reveals a variably cellular marrow (30%) with trilineage  hematopoiesis. CD138 highlights clusters of plasma cells (approximately  20% of the total marrow cellularity) which are lambda restricted by  kappa and lambda in  situ hybridization. There is a mixed population of  B and T cells by CD20 and CD3 immunohistochemistry respectively. [place]  red performed on the core biopsy and clot section is NEGATIVE.   IRON STAIN: Iron stains are performed on a bone marrow aspirate or touch  imprint smear and section of clot. The controls stained appropriately.     Storage Iron: Present    Ring Sideroblasts: Not identified   ADDITIONAL DATA/TESTING: Cytogenetics is pending and will be reported  separately.   CELL COUNT DATA:   Bone Marrow count performed on 500 cells shows:  Blasts:  0%  Myeloid: 44%  Promyelocytes: 0%  Erythroid:   28%  Myelocytes:  7%  Lymphocytes:  7%  Metamyelocytes:   0%  Plasma cells: 16%  Bands:  8%  Neutrophils:  23% M:E ratio:   1.57  Eosinophils:  5%  Basophils:   1%  Monocytes:   5%   Lab Data: CBC performed on 03/23/2020 shows:  WBC: 3.5 k/uL Neutrophils:  59%  Hgb: 11.0 g/dL Lymphocytes:  20%  HCT: 33.1 %  Monocytes:   14%  MCV: 105.4 fL Eosinophils:  6%  RDW: 14.6 %  Basophils:   1%  PLT: 139 k/uL   GROSS DESCRIPTION:   A. Bone marrow aspirate smear.   B. Received in B-plus fixative is a 1.2 x 1 x 0.1 cm aggregate of  tan-brown tissue, submitted in 1 cassette.   C. Received in B-plus fixative is a 1.2 cm in length by 0.2 cm diameter  hard tan-brown soft tissue core, submitted in 1 cassette following  decalcification in Immunocal. (AK 03/23/2020)%    Final Diagnosis performed by Thressa Sheller, MD.  Electronically signed  03/28/2020     RADIOGRAPHIC STUDIES: I have personally reviewed the radiological images as listed and agreed with the findings in the report: no evidence of lytic lesions on bone scan.  MR Card Morphology Wo/W Cm  Result Date: 04/19/2020 CLINICAL DATA:  Suspected cardiac amyloidosis EXAM: CARDIAC MRI TECHNIQUE: The patient was scanned on a 1.5 Tesla Siemens magnet. A dedicated cardiac coil was used. Functional imaging was done using Fiesta sequences. 2,3, and 4 chamber views were done to assess for RWMA's. Modified Simpson's rule using a short axis stack was used to calculate an ejection fraction on a dedicated work Conservation officer, nature. The patient received 8 cc of Gadavist. After 10 minutes inversion recovery sequences were used to assess for infiltration and scar tissue. CONTRAST:  8 cc  of Gadavist FINDINGS: Left ventricle: -Severe LVH -Mild systolic dysfunction -Mild dilatation -Marked elevation in ECV (71%) LV EF:  41% (Normal 56-78%) Absolute volumes: LV EDV: 129mL (Normal 52-141 mL) LV ESV: 21mL (Normal 13-51 mL) LV SV: 48mL (Normal 33-97 mL) CO: 3.2L/min (Normal 2.7-6.0 L/min) Indexed volumes: LV EDV: 1mL/sq-m (Normal 41-81 mL/sq-m) LV ESV: 27mL/sq-m (Normal 12-21 mL/sq-m) LV SV: 22mL/sq-m (Normal 26-56 mL/sq-m) CI: 2.1L/min/sq-m (Normal 1.8-3.8 L/min/sq-m) Right ventricle: Mild dilatation and mild systolic dysfunction RV EF: 40% (Normal 47-80%) Absolute volumes: RV EDV: 112mL (Normal 58-154 mL) RV ESV: 44mL (Normal 12-68 mL) RV SV: 81mL (Normal 35-98 mL) CO: 3.6L/min (Normal 2.7-6 L/min) Indexed volumes: RV EDV: 153mL/sq-m (Normal 48-87 mL/sq-m) RV ESV: 21mL/sq-m (Normal 11-28 mL/sq-m) RV SV: 62mL/sq-m (Normal 27-57 mL/sq-m) CI: 2.3L/min/sq-m (Normal 1.8-3.8 L/min/sq-m) Left atrium: Moderate enlargement Right atrium: Mild enlargement Mitral valve: Mild to moderate regurgitation (regurgitant volume 18cc, regurgitant fraction 31%) Aortic valve: No regurgitation Tricuspid valve: Mild regurgitation  Pulmonic valve: Mild regurgitation Aorta:  Normal proximal ascending aorta Pericardium: Small effusion IMPRESSION: 1. Findings consistent with cardiac amyloidosis, including severe LVH, diffuse late gadolinium enhancement, and marked elevation in extracellular volume (71%) 2.  Mild LV dilatation with mild systolic dysfunction (EF 04%) 3.  Mild RV dilatation with mild systolic dysfunction (EF 54%) 4. Mild to moderate mitral regurgitation (regurgitant volume 18cc, regurgitant fraction 31%) 5.  Small pericardial effusion Electronically Signed   By: Oswaldo Milian MD   On: 04/19/2020 17:48    ASSESSMENT & PLAN Mikayla Reynolds 79 y.o. female with medical history significant for CHF, CKD, HLD, CVA, and DM type II who presents for evaluation of a monoclonal gammopathy. After review of the labs, discussion with the patient, and reviewed the prior records the patient's findings are concerning for a plasma cell dyscrasia and possible amyloidosis.The patient has cardiac dysfunction with an equivocal myocardial amyloid imaging study performed on 02/28/2020 and a bone marrow biopsy on 03/23/2020 which showed 20% plasma cell population (but no evidence of amyloidosis). Cardiac MRI on 04/19/2020 is highly concerning for amyloid infiltrate. Based on this, we can presumptively start treatment while we await confirmation of amyloid with a tissue sample.   On exam today Mikayla Reynolds is tolerating therapy OK, with a modest decline in her energy levels. Her labs appear to have fallen with treatment and therefore today she will get a dose reduction of cyclophosphamide.   In order to confirm the diagnosis of amyloidosis we will need to obtain further tissue samples. Unfortunately the fat pad biopsy is negative, we need to consider biopsy of the liver versus cardiac biopsy in order to help confirm the diagnosis.  Based on her plasma cells and imaging there is strong supporting evidence to start treatment now while we await these  biopsy results.  We require the amyloid protein in order to effectively confirm the diagnosis.   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  As such the treatment of choice would be to target her plasma cell population with a triplet or quadruplet therapy.  Therapy of choice in this case would consist of daratumumab, Velcade, cyclophosphamide, and dexamethasone.  Given the patient's advanced age I would recommend that we start initially with the Velcade, cyclophosphamide, and Dex and then add the daratumumab on if she was shown to tolerate the treatment.  I discussed the side effects of this chemotherapy with the patient including neuropathy, elevated blood pressure, drop in blood counts, hypersensitivity reaction, chest tightness, increased infection risk, and fatigue.  The patient voiced her understanding of these findings and is agreeable to move forward with triple therapy with the intention of advancing to quadruple therapy if well tolerated.  The regimen of choice will be daratumumab, bortezomib, cyclophosphamide and dexamethasone per the ANDROMEDA Study ( Blood. 2020 Jul 2;136(1):71-80). We will modify this to start with CyBorD with the addition of daratumumab if she is shown to tolerate initial therapy.   Treatment consists of: Cyclophosphamide 300 mg/m2 intravenously and bortezomib 1.3 mg/m2 subcutaneously were given on days 1, 8, 15, and 22 of each 28 day cycle for up to 6 cycles. Dexamethasone 40 mg (starting dose) was given orally or intravenously weekly for each cycle for up to 6 cycles. DARA [place] was administered in a single, premixed vial and given by manual [place] injection over the course of 3 to 5 minutes weekly in cycles 1 to 2, every 2 weeks in cycles 3 to 6, and every 4 weeks thereafter as monotherapy  for a maximum of 2 years.  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 In order to confirm the diagnosis we will likely require a biopsy of cardiac tissue.  --given the ample evidence of an AL amyloidosis will start treatment while awaiting final pathological diagnosis. Further delay may result in worsening heart function/decline.  --The patient's elevations in LFTs may also indicate amyloid involvement of the liver (or more likely congestive hepatopathy).  --monoclonal gammopathy reveals no CRAB criteria. 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  -- given the patient's condition she is a good candidate for systemic treatment. We will start while we await there results of the cardiac biopsy.  --RTC in 2 weeks, will continue CyBorD (dara to be added later).   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  --continue to monitor   No orders of the defined types were placed in this encounter.  All questions were answered. The patient knows to call the clinic with any problems, questions or concerns.  A total of more than 30 minutes were spent on this encounter and over half of that time was spent on counseling and coordination of care as outlined above.   Ledell Peoples, MD Department of Hematology/Oncology [place] [facility] Phone: [phone] Pager: [phone] Email: [email or web address]  05/16/2020 6:40 PM

## 2020-05-17 ENCOUNTER — Other Ambulatory Visit: Payer: Self-pay

## 2020-05-17 ENCOUNTER — Inpatient Hospital Stay: Payer: Medicaid Other

## 2020-05-17 ENCOUNTER — Inpatient Hospital Stay (HOSPITAL_BASED_OUTPATIENT_CLINIC_OR_DEPARTMENT_OTHER): Payer: Medicaid Other | Admitting: Hematology and Oncology

## 2020-05-17 ENCOUNTER — Encounter: Payer: Self-pay | Admitting: Internal Medicine

## 2020-05-17 VITALS — BP 109/66 | HR 60 | Temp 98.3°F | Resp 18 | Ht 60.0 in | Wt 128.9 lb

## 2020-05-17 DIAGNOSIS — E854 Organ-limited amyloidosis: Secondary | ICD-10-CM

## 2020-05-17 DIAGNOSIS — I43 Cardiomyopathy in diseases classified elsewhere: Secondary | ICD-10-CM

## 2020-05-17 DIAGNOSIS — I5042 Chronic combined systolic (congestive) and diastolic (congestive) heart failure: Secondary | ICD-10-CM | POA: Diagnosis not present

## 2020-05-17 DIAGNOSIS — Z5111 Encounter for antineoplastic chemotherapy: Secondary | ICD-10-CM | POA: Diagnosis not present

## 2020-05-17 DIAGNOSIS — E119 Type 2 diabetes mellitus without complications: Secondary | ICD-10-CM | POA: Diagnosis not present

## 2020-05-17 DIAGNOSIS — Z79899 Other long term (current) drug therapy: Secondary | ICD-10-CM | POA: Diagnosis not present

## 2020-05-17 DIAGNOSIS — N183 Chronic kidney disease, stage 3 unspecified: Secondary | ICD-10-CM | POA: Diagnosis not present

## 2020-05-17 DIAGNOSIS — T451X5A Adverse effect of antineoplastic and immunosuppressive drugs, initial encounter: Secondary | ICD-10-CM | POA: Diagnosis not present

## 2020-05-17 DIAGNOSIS — Z8673 Personal history of transient ischemic attack (TIA), and cerebral infarction without residual deficits: Secondary | ICD-10-CM | POA: Diagnosis not present

## 2020-05-17 DIAGNOSIS — E785 Hyperlipidemia, unspecified: Secondary | ICD-10-CM | POA: Diagnosis not present

## 2020-05-17 DIAGNOSIS — Z5112 Encounter for antineoplastic immunotherapy: Secondary | ICD-10-CM | POA: Diagnosis not present

## 2020-05-17 DIAGNOSIS — Z9049 Acquired absence of other specified parts of digestive tract: Secondary | ICD-10-CM | POA: Diagnosis not present

## 2020-05-17 DIAGNOSIS — D472 Monoclonal gammopathy: Secondary | ICD-10-CM | POA: Diagnosis not present

## 2020-05-17 DIAGNOSIS — D6481 Anemia due to antineoplastic chemotherapy: Secondary | ICD-10-CM | POA: Diagnosis not present

## 2020-05-17 DIAGNOSIS — Z7982 Long term (current) use of aspirin: Secondary | ICD-10-CM | POA: Diagnosis not present

## 2020-05-17 DIAGNOSIS — E8581 Light chain (AL) amyloidosis: Secondary | ICD-10-CM | POA: Diagnosis not present

## 2020-05-17 DIAGNOSIS — N1831 Chronic kidney disease, stage 3a: Secondary | ICD-10-CM

## 2020-05-17 LAB — CMP (CANCER CENTER ONLY)
ALT: 22 U/L (ref 0–44)
AST: 50 U/L — ABNORMAL HIGH (ref 15–41)
Albumin: 3.3 g/dL — ABNORMAL LOW (ref 3.5–5.0)
Alkaline Phosphatase: 182 U/L — ABNORMAL HIGH (ref 38–126)
Anion gap: 8 (ref 5–15)
BUN: 38 mg/dL — ABNORMAL HIGH (ref 8–23)
CO2: 27 mmol/L (ref 22–32)
Calcium: 8.5 mg/dL — ABNORMAL LOW (ref 8.9–10.3)
Chloride: 100 mmol/L (ref 98–111)
Creatinine: 1.57 mg/dL — ABNORMAL HIGH (ref 0.44–1.00)
GFR, Est AFR Am: 36 mL/min — ABNORMAL LOW (ref >60)
GFR, Estimated: 31 mL/min — ABNORMAL LOW (ref >60)
Glucose, Bld: 137 mg/dL — ABNORMAL HIGH (ref 70–99)
Potassium: 3.2 mmol/L — ABNORMAL LOW (ref 3.5–5.1)
Sodium: 135 mmol/L (ref 135–145)
Total Bilirubin: 3.6 mg/dL (ref 0.3–1.2)
Total Protein: 7.6 g/dL (ref 6.5–8.1)

## 2020-05-17 LAB — CBC WITH DIFFERENTIAL (CANCER CENTER ONLY)
Abs Immature Granulocytes: 0.01 10*3/uL (ref 0.00–0.07)
Basophils Absolute: 0 10*3/uL (ref 0.0–0.1)
Basophils Relative: 0 %
Eosinophils Absolute: 0.1 10*3/uL (ref 0.0–0.5)
Eosinophils Relative: 2 %
HCT: 31.5 % — ABNORMAL LOW (ref 36.0–46.0)
Hemoglobin: 10.5 g/dL — ABNORMAL LOW (ref 12.0–15.0)
Immature Granulocytes: 0 %
Lymphocytes Relative: 14 %
Lymphs Abs: 0.3 10*3/uL — ABNORMAL LOW (ref 0.7–4.0)
MCH: 35.5 pg — ABNORMAL HIGH (ref 26.0–34.0)
MCHC: 33.3 g/dL (ref 30.0–36.0)
MCV: 106.4 fL — ABNORMAL HIGH (ref 80.0–100.0)
Monocytes Absolute: 0.4 10*3/uL (ref 0.1–1.0)
Monocytes Relative: 17 %
Neutro Abs: 1.7 10*3/uL (ref 1.7–7.7)
Neutrophils Relative %: 67 %
Platelet Count: 103 10*3/uL — ABNORMAL LOW (ref 150–400)
RBC: 2.96 MIL/uL — ABNORMAL LOW (ref 3.87–5.11)
RDW: 14.8 % (ref 11.5–15.5)
WBC Count: 2.5 10*3/uL — ABNORMAL LOW (ref 4.0–10.5)
nRBC: 0 % (ref 0.0–0.2)

## 2020-05-17 LAB — LACTATE DEHYDROGENASE: LDH: 341 U/L — ABNORMAL HIGH (ref 98–192)

## 2020-05-17 MED ORDER — SODIUM CHLORIDE 0.9 % IV SOLN
Freq: Once | INTRAVENOUS | Status: AC
  Filled 2020-05-17: qty 250

## 2020-05-17 MED ORDER — SODIUM CHLORIDE 0.9 % IV SOLN
225.0000 mg/m2 | Freq: Once | INTRAVENOUS | Status: AC
  Administered 2020-05-17: 340 mg via INTRAVENOUS
  Filled 2020-05-17: qty 17

## 2020-05-17 MED ORDER — BORTEZOMIB CHEMO SQ INJECTION 3.5 MG (2.5MG/ML)
1.5000 mg/m2 | Freq: Once | INTRAMUSCULAR | Status: AC
  Administered 2020-05-17: 2.25 mg via SUBCUTANEOUS
  Filled 2020-05-17: qty 0.9

## 2020-05-17 MED ORDER — PALONOSETRON HCL INJECTION 0.25 MG/5ML
0.2500 mg | Freq: Once | INTRAVENOUS | Status: AC
  Administered 2020-05-17: 0.25 mg via INTRAVENOUS

## 2020-05-17 MED ORDER — SODIUM CHLORIDE 0.9 % IV SOLN
40.0000 mg | Freq: Once | INTRAVENOUS | Status: AC
  Administered 2020-05-17: 40 mg via INTRAVENOUS
  Filled 2020-05-17: qty 4

## 2020-05-17 MED ORDER — PALONOSETRON HCL INJECTION 0.25 MG/5ML
INTRAVENOUS | Status: AC
  Filled 2020-05-17: qty 5

## 2020-05-17 NOTE — Progress Notes (Signed)
I received note from Dr. Barry Dienes of [facility] surgery.  Patient was seen 05/09/2020 for punch biopsy of abdominal fat.  Patient was referred by Dr. Lorenso Courier for this punch biopsy as he thinks she has amyloidosis.

## 2020-05-17 NOTE — Progress Notes (Signed)
Ok to treat today per Dr. Lorenso Courier SCr = 1.57 & Tbili = 3.6; amyloidosis He requested dose reduction of Cyclophosphamide of 25% starting today. He'll keep the Bortezomib dose at 1.5 mg/m2 and monitor closely.  Kennith Center, Pharm.D., CPP 05/17/2020@4 :09 PM

## 2020-05-21 ENCOUNTER — Encounter: Payer: Self-pay | Admitting: Hematology and Oncology

## 2020-05-24 ENCOUNTER — Other Ambulatory Visit: Payer: Self-pay

## 2020-05-24 ENCOUNTER — Other Ambulatory Visit: Payer: Self-pay | Admitting: Hematology and Oncology

## 2020-05-24 ENCOUNTER — Telehealth: Payer: Self-pay

## 2020-05-24 ENCOUNTER — Inpatient Hospital Stay: Payer: Medicaid Other

## 2020-05-24 VITALS — BP 114/64 | HR 60 | Temp 98.3°F | Resp 20

## 2020-05-24 DIAGNOSIS — I43 Cardiomyopathy in diseases classified elsewhere: Secondary | ICD-10-CM

## 2020-05-24 DIAGNOSIS — D472 Monoclonal gammopathy: Secondary | ICD-10-CM

## 2020-05-24 DIAGNOSIS — E8581 Light chain (AL) amyloidosis: Secondary | ICD-10-CM | POA: Diagnosis not present

## 2020-05-24 DIAGNOSIS — E854 Organ-limited amyloidosis: Secondary | ICD-10-CM

## 2020-05-24 DIAGNOSIS — Z79899 Other long term (current) drug therapy: Secondary | ICD-10-CM | POA: Diagnosis not present

## 2020-05-24 DIAGNOSIS — T451X5A Adverse effect of antineoplastic and immunosuppressive drugs, initial encounter: Secondary | ICD-10-CM | POA: Diagnosis not present

## 2020-05-24 DIAGNOSIS — Z5111 Encounter for antineoplastic chemotherapy: Secondary | ICD-10-CM | POA: Diagnosis not present

## 2020-05-24 DIAGNOSIS — Z7982 Long term (current) use of aspirin: Secondary | ICD-10-CM | POA: Diagnosis not present

## 2020-05-24 DIAGNOSIS — D6481 Anemia due to antineoplastic chemotherapy: Secondary | ICD-10-CM | POA: Diagnosis not present

## 2020-05-24 DIAGNOSIS — Z5112 Encounter for antineoplastic immunotherapy: Secondary | ICD-10-CM | POA: Diagnosis not present

## 2020-05-24 DIAGNOSIS — Z9049 Acquired absence of other specified parts of digestive tract: Secondary | ICD-10-CM | POA: Diagnosis not present

## 2020-05-24 DIAGNOSIS — N183 Chronic kidney disease, stage 3 unspecified: Secondary | ICD-10-CM | POA: Diagnosis not present

## 2020-05-24 DIAGNOSIS — I5042 Chronic combined systolic (congestive) and diastolic (congestive) heart failure: Secondary | ICD-10-CM | POA: Diagnosis not present

## 2020-05-24 DIAGNOSIS — E119 Type 2 diabetes mellitus without complications: Secondary | ICD-10-CM | POA: Diagnosis not present

## 2020-05-24 DIAGNOSIS — E785 Hyperlipidemia, unspecified: Secondary | ICD-10-CM | POA: Diagnosis not present

## 2020-05-24 DIAGNOSIS — Z8673 Personal history of transient ischemic attack (TIA), and cerebral infarction without residual deficits: Secondary | ICD-10-CM | POA: Diagnosis not present

## 2020-05-24 LAB — CBC WITH DIFFERENTIAL (CANCER CENTER ONLY)
Abs Immature Granulocytes: 0.02 10*3/uL (ref 0.00–0.07)
Basophils Absolute: 0 10*3/uL (ref 0.0–0.1)
Basophils Relative: 1 %
Eosinophils Absolute: 0 10*3/uL (ref 0.0–0.5)
Eosinophils Relative: 1 %
HCT: 31.9 % — ABNORMAL LOW (ref 36.0–46.0)
Hemoglobin: 10.9 g/dL — ABNORMAL LOW (ref 12.0–15.0)
Immature Granulocytes: 1 %
Lymphocytes Relative: 7 %
Lymphs Abs: 0.2 10*3/uL — ABNORMAL LOW (ref 0.7–4.0)
MCH: 36.2 pg — ABNORMAL HIGH (ref 26.0–34.0)
MCHC: 34.2 g/dL (ref 30.0–36.0)
MCV: 106 fL — ABNORMAL HIGH (ref 80.0–100.0)
Monocytes Absolute: 0.5 10*3/uL (ref 0.1–1.0)
Monocytes Relative: 16 %
Neutro Abs: 2.2 10*3/uL (ref 1.7–7.7)
Neutrophils Relative %: 74 %
Platelet Count: 109 10*3/uL — ABNORMAL LOW (ref 150–400)
RBC: 3.01 MIL/uL — ABNORMAL LOW (ref 3.87–5.11)
RDW: 14.8 % (ref 11.5–15.5)
WBC Count: 3 10*3/uL — ABNORMAL LOW (ref 4.0–10.5)
nRBC: 0 % (ref 0.0–0.2)

## 2020-05-24 LAB — CMP (CANCER CENTER ONLY)
ALT: 30 U/L (ref 0–44)
AST: 53 U/L — ABNORMAL HIGH (ref 15–41)
Albumin: 3.3 g/dL — ABNORMAL LOW (ref 3.5–5.0)
Alkaline Phosphatase: 207 U/L — ABNORMAL HIGH (ref 38–126)
Anion gap: 6 (ref 5–15)
BUN: 30 mg/dL — ABNORMAL HIGH (ref 8–23)
CO2: 27 mmol/L (ref 22–32)
Calcium: 8.7 mg/dL — ABNORMAL LOW (ref 8.9–10.3)
Chloride: 104 mmol/L (ref 98–111)
Creatinine: 1.38 mg/dL — ABNORMAL HIGH (ref 0.44–1.00)
GFR, Est AFR Am: 43 mL/min — ABNORMAL LOW (ref >60)
GFR, Estimated: 37 mL/min — ABNORMAL LOW (ref >60)
Glucose, Bld: 134 mg/dL — ABNORMAL HIGH (ref 70–99)
Potassium: 3.9 mmol/L (ref 3.5–5.1)
Sodium: 137 mmol/L (ref 135–145)
Total Bilirubin: 3.6 mg/dL (ref 0.3–1.2)
Total Protein: 7.2 g/dL (ref 6.5–8.1)

## 2020-05-24 MED ORDER — BORTEZOMIB CHEMO SQ INJECTION 3.5 MG (2.5MG/ML)
1.5000 mg/m2 | Freq: Once | INTRAMUSCULAR | Status: AC
  Administered 2020-05-24: 2.25 mg via SUBCUTANEOUS
  Filled 2020-05-24: qty 0.9

## 2020-05-24 MED ORDER — SODIUM CHLORIDE 0.9 % IV SOLN
225.0000 mg/m2 | Freq: Once | INTRAVENOUS | Status: AC
  Administered 2020-05-24: 340 mg via INTRAVENOUS
  Filled 2020-05-24: qty 17

## 2020-05-24 MED ORDER — SODIUM CHLORIDE 0.9 % IV SOLN
Freq: Once | INTRAVENOUS | Status: AC
  Filled 2020-05-24: qty 250

## 2020-05-24 MED ORDER — SODIUM CHLORIDE 0.9 % IV SOLN
40.0000 mg | Freq: Once | INTRAVENOUS | Status: AC
  Administered 2020-05-24: 40 mg via INTRAVENOUS
  Filled 2020-05-24: qty 4

## 2020-05-24 MED ORDER — PALONOSETRON HCL INJECTION 0.25 MG/5ML
0.2500 mg | Freq: Once | INTRAVENOUS | Status: AC
  Administered 2020-05-24: 0.25 mg via INTRAVENOUS

## 2020-05-24 MED ORDER — PALONOSETRON HCL INJECTION 0.25 MG/5ML
INTRAVENOUS | Status: AC
  Filled 2020-05-24: qty 5

## 2020-05-24 NOTE — Progress Notes (Signed)
Per Dr. Lorenso Courier: Faythe Ghee to treat with Scr. of 1.57 and elevated tot bil.   CBC orders in for Lab to run before Cyclophosphamide treatment.

## 2020-05-24 NOTE — Patient Instructions (Signed)
[facility] Discharge Instructions for Patients Receiving Chemotherapy  Today you received the following chemotherapy agents Bortezomib (VELCADE) & Cyclophosphamide (CYTOXAN).  To help prevent nausea and vomiting after your treatment, we encourage you to take your nausea medication as prescribed.   If you develop nausea and vomiting that is not controlled by your nausea medication, call the clinic.   BELOW ARE SYMPTOMS THAT SHOULD BE REPORTED IMMEDIATELY:  *FEVER GREATER THAN 100.5 F  *CHILLS WITH OR WITHOUT FEVER  NAUSEA AND VOMITING THAT IS NOT CONTROLLED WITH YOUR NAUSEA MEDICATION  *UNUSUAL SHORTNESS OF BREATH  *UNUSUAL BRUISING OR BLEEDING  TENDERNESS IN MOUTH AND THROAT WITH OR WITHOUT PRESENCE OF ULCERS  *URINARY PROBLEMS  *BOWEL PROBLEMS  UNUSUAL RASH Items with * indicate a potential emergency and should be followed up as soon as possible.  Feel free to call the clinic should you have any questions or concerns. The clinic phone number is [phone].  Please show the CHEMO ALERT CARD at check-in to the Emergency Department and triage nurse.   

## 2020-05-24 NOTE — Telephone Encounter (Signed)
CRITICAL VALUE STICKER  CRITICAL VALUE: Total Bili = 3.6  RECEIVER (on-site recipient of call): Yetta Glassman, CMA  DATE & TIME NOTIFIED: 05/24/20 at 2:59pm  MESSENGER (representative from lab): Suanne Marker  MD NOTIFIED: Dr. Lorenso Courier  TIME OF NOTIFICATION: 3:03pm  RESPONSE: No action needed

## 2020-05-31 ENCOUNTER — Inpatient Hospital Stay (HOSPITAL_BASED_OUTPATIENT_CLINIC_OR_DEPARTMENT_OTHER): Payer: Medicaid Other | Admitting: Hematology and Oncology

## 2020-05-31 ENCOUNTER — Inpatient Hospital Stay: Payer: Medicaid Other

## 2020-05-31 ENCOUNTER — Other Ambulatory Visit: Payer: Self-pay

## 2020-05-31 VITALS — BP 110/72 | HR 69 | Temp 98.3°F | Resp 18 | Ht 60.0 in | Wt 130.4 lb

## 2020-05-31 DIAGNOSIS — N1831 Chronic kidney disease, stage 3a: Secondary | ICD-10-CM

## 2020-05-31 DIAGNOSIS — Z7982 Long term (current) use of aspirin: Secondary | ICD-10-CM | POA: Diagnosis not present

## 2020-05-31 DIAGNOSIS — Z5111 Encounter for antineoplastic chemotherapy: Secondary | ICD-10-CM | POA: Diagnosis not present

## 2020-05-31 DIAGNOSIS — Z5112 Encounter for antineoplastic immunotherapy: Secondary | ICD-10-CM | POA: Diagnosis not present

## 2020-05-31 DIAGNOSIS — I43 Cardiomyopathy in diseases classified elsewhere: Secondary | ICD-10-CM

## 2020-05-31 DIAGNOSIS — E854 Organ-limited amyloidosis: Secondary | ICD-10-CM

## 2020-05-31 DIAGNOSIS — E8581 Light chain (AL) amyloidosis: Secondary | ICD-10-CM | POA: Diagnosis not present

## 2020-05-31 DIAGNOSIS — E785 Hyperlipidemia, unspecified: Secondary | ICD-10-CM | POA: Diagnosis not present

## 2020-05-31 DIAGNOSIS — Z79899 Other long term (current) drug therapy: Secondary | ICD-10-CM | POA: Diagnosis not present

## 2020-05-31 DIAGNOSIS — Z9049 Acquired absence of other specified parts of digestive tract: Secondary | ICD-10-CM | POA: Diagnosis not present

## 2020-05-31 DIAGNOSIS — Z8673 Personal history of transient ischemic attack (TIA), and cerebral infarction without residual deficits: Secondary | ICD-10-CM | POA: Diagnosis not present

## 2020-05-31 DIAGNOSIS — N183 Chronic kidney disease, stage 3 unspecified: Secondary | ICD-10-CM | POA: Diagnosis not present

## 2020-05-31 DIAGNOSIS — D472 Monoclonal gammopathy: Secondary | ICD-10-CM | POA: Diagnosis not present

## 2020-05-31 DIAGNOSIS — I5042 Chronic combined systolic (congestive) and diastolic (congestive) heart failure: Secondary | ICD-10-CM | POA: Diagnosis not present

## 2020-05-31 DIAGNOSIS — E119 Type 2 diabetes mellitus without complications: Secondary | ICD-10-CM | POA: Diagnosis not present

## 2020-05-31 DIAGNOSIS — T451X5A Adverse effect of antineoplastic and immunosuppressive drugs, initial encounter: Secondary | ICD-10-CM | POA: Diagnosis not present

## 2020-05-31 DIAGNOSIS — D6481 Anemia due to antineoplastic chemotherapy: Secondary | ICD-10-CM | POA: Diagnosis not present

## 2020-05-31 LAB — CMP (CANCER CENTER ONLY)
ALT: 34 U/L (ref 0–44)
AST: 51 U/L — ABNORMAL HIGH (ref 15–41)
Albumin: 3.2 g/dL — ABNORMAL LOW (ref 3.5–5.0)
Alkaline Phosphatase: 240 U/L — ABNORMAL HIGH (ref 38–126)
Anion gap: 8 (ref 5–15)
BUN: 24 mg/dL — ABNORMAL HIGH (ref 8–23)
CO2: 20 mmol/L — ABNORMAL LOW (ref 22–32)
Calcium: 8.5 mg/dL — ABNORMAL LOW (ref 8.9–10.3)
Chloride: 106 mmol/L (ref 98–111)
Creatinine: 1.4 mg/dL — ABNORMAL HIGH (ref 0.44–1.00)
GFR, Est AFR Am: 42 mL/min — ABNORMAL LOW (ref >60)
GFR, Estimated: 36 mL/min — ABNORMAL LOW (ref >60)
Glucose, Bld: 128 mg/dL — ABNORMAL HIGH (ref 70–99)
Potassium: 4 mmol/L (ref 3.5–5.1)
Sodium: 134 mmol/L — ABNORMAL LOW (ref 135–145)
Total Bilirubin: 2.9 mg/dL — ABNORMAL HIGH (ref 0.3–1.2)
Total Protein: 6.8 g/dL (ref 6.5–8.1)

## 2020-05-31 LAB — CBC WITH DIFFERENTIAL (CANCER CENTER ONLY)
Abs Immature Granulocytes: 0.02 10*3/uL (ref 0.00–0.07)
Basophils Absolute: 0 10*3/uL (ref 0.0–0.1)
Basophils Relative: 0 %
Eosinophils Absolute: 0.1 10*3/uL (ref 0.0–0.5)
Eosinophils Relative: 2 %
HCT: 29.7 % — ABNORMAL LOW (ref 36.0–46.0)
Hemoglobin: 10.3 g/dL — ABNORMAL LOW (ref 12.0–15.0)
Immature Granulocytes: 1 %
Lymphocytes Relative: 9 %
Lymphs Abs: 0.3 10*3/uL — ABNORMAL LOW (ref 0.7–4.0)
MCH: 36.7 pg — ABNORMAL HIGH (ref 26.0–34.0)
MCHC: 34.7 g/dL (ref 30.0–36.0)
MCV: 105.7 fL — ABNORMAL HIGH (ref 80.0–100.0)
Monocytes Absolute: 0.7 10*3/uL (ref 0.1–1.0)
Monocytes Relative: 22 %
Neutro Abs: 1.9 10*3/uL (ref 1.7–7.7)
Neutrophils Relative %: 66 %
Platelet Count: 97 10*3/uL — ABNORMAL LOW (ref 150–400)
RBC: 2.81 MIL/uL — ABNORMAL LOW (ref 3.87–5.11)
RDW: 15 % (ref 11.5–15.5)
WBC Count: 2.9 10*3/uL — ABNORMAL LOW (ref 4.0–10.5)
nRBC: 0 % (ref 0.0–0.2)

## 2020-05-31 MED ORDER — SODIUM CHLORIDE 0.9 % IV SOLN
40.0000 mg | Freq: Once | INTRAVENOUS | Status: AC
  Administered 2020-05-31: 40 mg via INTRAVENOUS
  Filled 2020-05-31: qty 4

## 2020-05-31 MED ORDER — SODIUM CHLORIDE 0.9 % IV SOLN
225.0000 mg/m2 | Freq: Once | INTRAVENOUS | Status: AC
  Administered 2020-05-31: 340 mg via INTRAVENOUS
  Filled 2020-05-31: qty 17

## 2020-05-31 MED ORDER — BORTEZOMIB CHEMO SQ INJECTION 3.5 MG (2.5MG/ML)
1.5000 mg/m2 | Freq: Once | INTRAMUSCULAR | Status: AC
  Administered 2020-05-31: 2.25 mg via SUBCUTANEOUS
  Filled 2020-05-31: qty 0.9

## 2020-05-31 MED ORDER — SODIUM CHLORIDE 0.9 % IV SOLN
Freq: Once | INTRAVENOUS | Status: AC
  Filled 2020-05-31: qty 250

## 2020-05-31 MED ORDER — PALONOSETRON HCL INJECTION 0.25 MG/5ML
0.2500 mg | Freq: Once | INTRAVENOUS | Status: AC
  Administered 2020-05-31: 0.25 mg via INTRAVENOUS

## 2020-05-31 MED ORDER — PALONOSETRON HCL INJECTION 0.25 MG/5ML
INTRAVENOUS | Status: AC
  Filled 2020-05-31: qty 5

## 2020-05-31 NOTE — Progress Notes (Signed)
Per Dr. Lorenso Courier: okay to treat with abnormal labs.

## 2020-05-31 NOTE — Patient Instructions (Signed)
[facility] Discharge Instructions for Patients Receiving Chemotherapy  Today you received the following chemotherapy agents Bortezomib (VELCADE) & Cyclophosphamide (CYTOXAN).  To help prevent nausea and vomiting after your treatment, we encourage you to take your nausea medication as prescribed.   If you develop nausea and vomiting that is not controlled by your nausea medication, call the clinic.   BELOW ARE SYMPTOMS THAT SHOULD BE REPORTED IMMEDIATELY:  *FEVER GREATER THAN 100.5 F  *CHILLS WITH OR WITHOUT FEVER  NAUSEA AND VOMITING THAT IS NOT CONTROLLED WITH YOUR NAUSEA MEDICATION  *UNUSUAL SHORTNESS OF BREATH  *UNUSUAL BRUISING OR BLEEDING  TENDERNESS IN MOUTH AND THROAT WITH OR WITHOUT PRESENCE OF ULCERS  *URINARY PROBLEMS  *BOWEL PROBLEMS  UNUSUAL RASH Items with * indicate a potential emergency and should be followed up as soon as possible.  Feel free to call the clinic should you have any questions or concerns. The clinic phone number is [phone].  Please show the CHEMO ALERT CARD at check-in to the Emergency Department and triage nurse.   

## 2020-05-31 NOTE — Progress Notes (Signed)
Per Dr. Lorenso Courier, keeping Cytoxan dose at 225 mg/m2 (25% reduction) given Pltc < 100 today.  Not neutropenic.  Kennith Center, Pharm.D., CPP 05/31/2020@4 :28 PM

## 2020-06-02 ENCOUNTER — Encounter: Payer: Self-pay | Admitting: Hematology and Oncology

## 2020-06-02 NOTE — Progress Notes (Signed)
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 Ordered during evaluation for cardiomyopathy.  2) 03/13/2020: establish care with Dr. 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 The patient's last visit was on 04/24/2020. In the interim since the last visit she has started treatment with CyBorD chemotherapy.   On exam today communication with the patient is assisted by an approved hospital [place] interpreter. Mikayla Reynolds is also accompanied by her son.  She notes that she has been having a lot of fatigue and tiredness, but otherwise not having any issues with shortness of breath or chest pain.  She denies having any issues with fevers, chills, sweats, nausea, vomiting or diarrhea.  She denies having any issues with neuropathy at this time.  She is willing and able to proceed with treatment and notes that she is feeling a little bit better and felt that her shortness of breath improved following her first treatment (she may have had some benefit from the steroid therapy).  Full 10 point ROS is  listed below.  MEDICAL HISTORY:  Past Medical History:  Diagnosis Date  . Abnormal liver function   . Anemia, unspecified   . Chronic combined systolic and diastolic CHF (congestive heart failure) ([place])   . CKD (chronic kidney disease), stage III ([place]) 02/19/2018  . Elevated troponin 12/27/2017  . Hyperlipidemia   . Junctional bradycardia   . Mitral regurgitation   . Pre-diabetes   . Renal mass   . Stroke [facility])     SURGICAL HISTORY: Past Surgical History:  Procedure Laterality Date  . abdominal wall surgery  2017  . APPENDECTOMY    . LOOP RECORDER INSERTION N/A 06/23/2018   Procedure: LOOP RECORDER INSERTION;  Surgeon: [name], MD;  Location: [facility];  Service: Cardiovascular;  Laterality: N/A;  . TEE WITHOUT CARDIOVERSION N/A 06/23/2018   Procedure: TRANSESOPHAGEAL ECHOCARDIOGRAM (TEE);  Surgeon: [name], MD;  Location[facility] ENDOSCOPY;  Service: Cardiovascular;  Laterality: N/A;    SOCIAL HISTORY: Social History   Socioeconomic History  . Marital status: Widowed    Spouse name: Not on file  . Number of children: Not on file  . Years of education: Not on file  . Highest education level: Not on file  Occupational History  . Not on file  Tobacco Use  . Smoking status: Never Smoker  . Smokeless tobacco: Never Used  Vaping Use  . Vaping Use: Never used  Substance and Sexual Activity  . Alcohol use: Not Currently  . Drug use: Not Currently  . Sexual activity: Not Currently  Other Topics Concern  . Not on file  Social History Narrative  . Not on file   Social Determinants of Health   Financial Resource Strain:   .  Difficulty of Paying Living Expenses: Not on file  Food Insecurity:   . Worried About Charity fundraiser in the Last Year: Not on file  . Ran Out of Food in the Last Year: Not on file  Transportation Needs:   . Lack of Transportation (Medical): Not on file  . Lack of Transportation (Non-Medical): Not on file  Physical Activity:    . Days of Exercise per Week: Not on file  . Minutes of Exercise per Session: Not on file  Stress:   . Feeling of Stress : Not on file  Social Connections:   . Frequency of Communication with Friends and Family: Not on file  . Frequency of Social Gatherings with Friends and Family: Not on file  . Attends Religious Services: Not on file  . Active Member of Clubs or Organizations: Not on file  . Attends Archivist Meetings: Not on file  . Marital Status: Not on file  Intimate Partner Violence:   . Fear of Current or Ex-Partner: Not on file  . Emotionally Abused: Not on file  . Physically Abused: Not on file  . Sexually Abused: Not on file    FAMILY HISTORY: Family History  Problem Relation Age of Onset  . Healthy Sister   . Healthy Brother     ALLERGIES:  has No Known Allergies.  MEDICATIONS:  Current Outpatient Medications  Medication Sig Dispense Refill  . acyclovir (ZOVIRAX) 400 MG tablet Take 1 tablet (400 mg total) by mouth daily. 30 tablet 3  . aspirin EC 81 MG tablet Take 1 tablet (81 mg total) by mouth daily. Swallow whole. 90 tablet 3  . atorvastatin (LIPITOR) 40 MG tablet Take 1 tablet (40 mg total) by mouth daily at 6 PM. 90 tablet 3  . furosemide (LASIX) 20 MG tablet Take 1 tablet (20 mg total) by mouth every other day. 90 tablet 3  . Methylcellulose, Laxative, (FIBER THERAPY) 500 MG TABS Take by mouth daily.    . Polyethylene Glycol 3350 (MIRALAX PO) Take by mouth.    . potassium chloride SA (KLOR-CON) 20 MEQ tablet Take 2 tablets by mouth for 2 days then hold 30 tablet 0  . spironolactone (ALDACTONE) 25 MG tablet Take 0.5 tablets (12.5 mg total) by mouth daily. 45 tablet 3   No current facility-administered medications for this visit.    REVIEW OF SYSTEMS:   Constitutional: ( - ) fevers, ( - )  chills , ( - ) night sweats Eyes: ( - ) blurriness of vision, ( - ) double vision, ( - ) watery eyes Ears, nose, mouth, throat, and face: ( - ) mucositis, (  - ) sore throat Respiratory: ( - ) cough, ( - ) dyspnea, ( - ) wheezes Cardiovascular: ( - ) palpitation, ( - ) chest discomfort, ( - ) lower extremity swelling Gastrointestinal:  ( - ) nausea, ( - ) heartburn, ( - ) change in bowel habits Skin: ( - ) abnormal skin rashes Lymphatics: ( - ) new lymphadenopathy, ( - ) easy bruising Neurological: ( - ) numbness, ( - ) tingling, ( - ) new weaknesses Behavioral/Psych: ( - ) mood change, ( - ) new changes  All other systems were reviewed with the patient and are negative.  PHYSICAL EXAMINATION: ECOG PERFORMANCE STATUS: 1 - Symptomatic but completely ambulatory  Vitals:   05/31/20 1457  BP: 110/72  Pulse: 69  Resp: 18  Temp: 98.3 F (36.8 C)  SpO2: 100%   Filed  Weights   05/31/20 1457  Weight: 130 lb 6.4 oz (59.1 kg)    GENERAL: well appearing elderly [place] female. alert, no distress and comfortable SKIN: skin color, texture, turgor are normal, no rashes or significant lesions EYES: conjunctiva are pink and non-injected, sclera clear LUNGS: clear to auscultation and percussion with normal breathing effort HEART: regular rate & rhythm and no murmurs and no lower extremity edema Musculoskeletal: no cyanosis of digits and no clubbing  PSYCH: alert & oriented x 3, fluent speech NEURO: no focal motor/sensory deficits  LABORATORY DATA:  I have reviewed the data as listed CBC Latest Ref Rng & Units 05/31/2020 05/24/2020 05/17/2020  WBC 4.0 - 10.5 K/uL 2.9(L) 3.0(L) 2.5(L)  Hemoglobin 12.0 - 15.0 g/dL 10.3(L) 10.9(L) 10.5(L)  Hematocrit 36 - 46 % 29.7(L) 31.9(L) 31.5(L)  Platelets 150 - 400 K/uL 97(L) 109(L) 103(L)    CMP Latest Ref Rng & Units 05/31/2020 05/24/2020 05/17/2020  Glucose 70 - 99 mg/dL 128(H) 134(H) 137(H)  BUN 8 - 23 mg/dL 24(H) 30(H) 38(H)  Creatinine 0.44 - 1.00 mg/dL 1.40(H) 1.38(H) 1.57(H)  Sodium 135 - 145 mmol/L 134(L) 137 135  Potassium 3.5 - 5.1 mmol/L 4.0 3.9 3.2(L)  Chloride 98 - 111 mmol/L 106 104 100    CO2 22 - 32 mmol/L 20(L) 27 27  Calcium 8.9 - 10.3 mg/dL 8.5(L) 8.7(L) 8.5(L)  Total Protein 6.5 - 8.1 g/dL 6.8 7.2 7.6  Total Bilirubin 0.3 - 1.2 mg/dL 2.9(H) 3.6(HH) 3.6(HH)  Alkaline Phos 38 - 126 U/L 240(H) 207(H) 182(H)  AST 15 - 41 U/L 51(H) 53(H) 50(H)  ALT 0 - 44 U/L 34 30 22    Lab Results  Component Value Date   MPROTEIN 2.7 (H) 03/13/2020   MPROTEIN 2.4 (H) 03/01/2020   Lab Results  Component Value Date   KPAFRELGTCHN 23.8 (H) 03/13/2020   LAMBDASER 34.5 (H) 03/13/2020   KAPLAMBRATIO 7.27 03/23/2020   KAPLAMBRATIO 0.69 03/13/2020     PATHOLOGY: Surgical Pathology  CASE: [phone]  PATIENT: Mikayla Reynolds  Bone Marrow Report   Clinical History: None provided   DIAGNOSIS:   BONE MARROW, ASPIRATE, CLOT, CORE:  - Cellular marrow involved by a plasma cell neoplasm (20%)  - See comment and microscopic description below   PERIPHERAL BLOOD:  - Pancytopenia  - See complete blood cell count   COMMENT:   The bone marrow biopsy is involved by a plasma cell neoplasm, 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 Correlation with radiographic findings  and laboratory data is required to characterize this plasma cell  neoplasm. There was no amyloid detected on the core biopsy or clot  section; however, the sample was suboptimal for evaluation.   MICROSCOPIC DESCRIPTION:   PERIPHERAL BLOOD SMEAR: The peripheral blood has a macrocytic anemia,  leukopenia and thrombocytopenia. Red cell morphology shows mild  anisocytosis. There is no rouleaux formation. Leukocytes are  morphologically unremarkable. Circulating plasma cellsare not  identified.   BONE MARROW ASPIRATE: Spicular, cellular and adequate for evaluation  Erythroid precursors: Orderly maturation without overt dysplasia  Granulocytic precursors: Orderly maturation without overt dysplasia  Megakaryocytes: Qualitatively and quantitatively unremarkable  Lymphocytes/plasma cells: There are increased  plasma cells (16% by  manual differential counts) some are enlarged with multi nucleated forms  present. There is no lymphocytosis.   TOUCH PREPARATIONS: Markedly hypocellular with no additional findings as  compared to the aspirate smear   CLOT AND BIOPSY: Examination of the bone marrow core biopsy and clot  section reveals a variably cellular  marrow (30%) with trilineage  hematopoiesis. CD138 highlights clusters of plasma cells (approximately  20% of the total marrow cellularity) which are lambda restricted by  kappa and lambda in situ hybridization. There is a mixed population of  B and T cells by CD20 and CD3 immunohistochemistry respectively. [place]  red performed on the core biopsy and clot section is NEGATIVE.   IRON STAIN: Iron stains are performed on a bone marrow aspirate or touch  imprint smear and section of clot. The controls stained appropriately.     Storage Iron: Present    Ring Sideroblasts: Not identified   ADDITIONAL DATA/TESTING: Cytogenetics is pending and will be reported  separately.   CELL COUNT DATA:   Bone Marrow count performed on 500 cells shows:  Blasts:  0%  Myeloid: 44%  Promyelocytes: 0%  Erythroid:   28%  Myelocytes:  7%  Lymphocytes:  7%  Metamyelocytes:   0%  Plasma cells: 16%  Bands:  8%  Neutrophils:  23% M:E ratio:   1.57  Eosinophils:  5%  Basophils:   1%  Monocytes:   5%   Lab Data: CBC performed on 03/23/2020 shows:  WBC: 3.5 k/uL Neutrophils:  59%  Hgb: 11.0 g/dL Lymphocytes:  20%  HCT: 33.1 %  Monocytes:   14%  MCV: 105.4 fL Eosinophils:  6%  RDW: 14.6 %  Basophils:   1%  PLT: 139 k/uL   GROSS DESCRIPTION:   A. Bone marrow aspirate smear.   B. Received in B-plus fixative is a 1.2 x 1 x 0.1 cm aggregate of  tan-brown tissue, submitted in 1 cassette.   C. Received in B-plus fixative is a 1.2 cm in length by 0.2 cm diameter  hard tan-brown soft tissue core, submitted in 1  cassette following  decalcification in Immunocal. (AK 03/23/2020)%    Final Diagnosis performed by Thressa Sheller, MD.  Electronically signed  03/28/2020    RADIOGRAPHIC STUDIES: I have personally reviewed the radiological images as listed and agreed with the findings in the report: no evidence of lytic lesions on bone scan.  No results found.  ASSESSMENT & PLAN Mikayla Reynolds 79 y.o. female with medical history significant for CHF, CKD, HLD, CVA, and DM type II who presents for evaluation of a monoclonal gammopathy. After review of the labs, discussion with the patient, and reviewed the prior records the patient's findings are concerning for a plasma cell dyscrasia and possible amyloidosis.The patient has cardiac dysfunction with an equivocal myocardial amyloid imaging study performed on 02/28/2020 and a bone marrow biopsy on 03/23/2020 which showed 20% plasma cell population (but no evidence of amyloidosis). Cardiac MRI on 04/19/2020 is highly concerning for amyloid infiltrate. Based on this, we can presumptively start treatment while we await confirmation of amyloid with a tissue sample.   On exam today Mikayla Reynolds is tolerating therapy well with the exception of some fatigue.  She has not had any issues with nausea, vomiting, or diarrhea.  She denies having any neuropathy at this time.  She is willing and able to proceed with treatment at this time.  In order to confirm the diagnosis of amyloidosis we will need to obtain further tissue samples. Unfortunately the fat pad biopsy is negative, we need to consider biopsy of the liver versus cardiac biopsy in order to help confirm the diagnosis.  Based on her plasma cells and imaging there is strong supporting evidence to start treatment now while we await these biopsy results.  We require the amyloid protein in  order to effectively confirm the diagnosis.   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  As  such the treatment of choice would be to target her plasma cell population with a triplet or quadruplet therapy.  Therapy of choice in this case would consist of daratumumab, Velcade, cyclophosphamide, and dexamethasone.  Given the patient's advanced age I would recommend that we start initially with the Velcade, cyclophosphamide, and Dex and then add the daratumumab on if she was shown to tolerate the treatment.  I discussed the side effects of this chemotherapy with the patient including neuropathy, elevated blood pressure, drop in blood counts, hypersensitivity reaction, chest tightness, increased infection risk, and fatigue.  The patient voiced her understanding of these findings and is agreeable to move forward with triple therapy with the intention of advancing to quadruple therapy if well tolerated.  The regimen of choice will be daratumumab, bortezomib, cyclophosphamide and dexamethasone per the ANDROMEDA Study ( Blood. 2020 Jul 2;136(1):71-80). We will modify this to start with CyBorD with the addition of daratumumab if she is shown to tolerate initial therapy.   Treatment consists of: Cyclophosphamide 300 mg/m2 intravenously and bortezomib 1.3 mg/m2 subcutaneously were given on days 1, 8, 15, and 22 of each 28 day cycle for up to 6 cycles. Dexamethasone 40 mg (starting dose) was given orally or intravenously weekly for each cycle for up to 6 cycles. DARA Lockhart was administered in a single, premixed vial and given by manual Bowers injection over the course of 3 to 5 minutes weekly in cycles 1 to 2, every 2 weeks in cycles 3 to 6, and every 4 weeks thereafter as monotherapy for a maximum of 2 years.  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 In order to confirm the diagnosis. In order to confirm diagnosis the next best step would be a biopsy of cardiac tissue. This may not be feasible moving forward.  --given the ample evidence  of an AL amyloidosis will start treatment while awaiting final pathological diagnosis. Further delay may result in worsening heart function/decline.  --The patient's elevations in LFTs may also indicate amyloid involvement of the liver (or more likely congestive hepatopathy).  --monoclonal gammopathy reveals no CRAB criteria. 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  -- given the patient's condition she is a good candidate for systemic treatment. We will start while we await there results of the cardiac biopsy.  --RTC in 2 weeks, will continue CyBorD (dara to be added later).   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  --continue to monitor   No orders of the defined types were placed in this encounter.  All questions were answered. The patient knows to call the clinic with any problems, questions or concerns.  A total of more than 30 minutes were spent on this encounter and over half of that time was spent on counseling and coordination of care as outlined above.   Ledell Peoples, MD Department of Hematology/Oncology [place] at [facility] Phone: [phone] Pager: [phone] Email: [email or web address]  06/02/2020 2:18 PM

## 2020-06-07 ENCOUNTER — Inpatient Hospital Stay: Payer: Medicaid Other | Attending: Hematology and Oncology

## 2020-06-07 ENCOUNTER — Other Ambulatory Visit: Payer: Self-pay | Admitting: Hematology and Oncology

## 2020-06-07 ENCOUNTER — Inpatient Hospital Stay: Payer: Medicaid Other

## 2020-06-07 ENCOUNTER — Encounter (HOSPITAL_COMMUNITY): Payer: Medicaid Other | Admitting: Internal Medicine

## 2020-06-07 ENCOUNTER — Other Ambulatory Visit: Payer: Self-pay

## 2020-06-07 VITALS — BP 112/69 | HR 64 | Temp 98.7°F | Resp 18 | Wt 122.2 lb

## 2020-06-07 DIAGNOSIS — Z9049 Acquired absence of other specified parts of digestive tract: Secondary | ICD-10-CM | POA: Diagnosis not present

## 2020-06-07 DIAGNOSIS — Z8673 Personal history of transient ischemic attack (TIA), and cerebral infarction without residual deficits: Secondary | ICD-10-CM | POA: Insufficient documentation

## 2020-06-07 DIAGNOSIS — Z7982 Long term (current) use of aspirin: Secondary | ICD-10-CM | POA: Diagnosis not present

## 2020-06-07 DIAGNOSIS — E119 Type 2 diabetes mellitus without complications: Secondary | ICD-10-CM | POA: Insufficient documentation

## 2020-06-07 DIAGNOSIS — D6959 Other secondary thrombocytopenia: Secondary | ICD-10-CM | POA: Diagnosis not present

## 2020-06-07 DIAGNOSIS — E8581 Light chain (AL) amyloidosis: Secondary | ICD-10-CM | POA: Insufficient documentation

## 2020-06-07 DIAGNOSIS — N183 Chronic kidney disease, stage 3 unspecified: Secondary | ICD-10-CM | POA: Insufficient documentation

## 2020-06-07 DIAGNOSIS — Z5112 Encounter for antineoplastic immunotherapy: Secondary | ICD-10-CM | POA: Insufficient documentation

## 2020-06-07 DIAGNOSIS — T451X5A Adverse effect of antineoplastic and immunosuppressive drugs, initial encounter: Secondary | ICD-10-CM | POA: Diagnosis not present

## 2020-06-07 DIAGNOSIS — Z79899 Other long term (current) drug therapy: Secondary | ICD-10-CM | POA: Insufficient documentation

## 2020-06-07 DIAGNOSIS — E854 Organ-limited amyloidosis: Secondary | ICD-10-CM

## 2020-06-07 DIAGNOSIS — D6481 Anemia due to antineoplastic chemotherapy: Secondary | ICD-10-CM | POA: Insufficient documentation

## 2020-06-07 DIAGNOSIS — I5042 Chronic combined systolic (congestive) and diastolic (congestive) heart failure: Secondary | ICD-10-CM | POA: Insufficient documentation

## 2020-06-07 DIAGNOSIS — Z5111 Encounter for antineoplastic chemotherapy: Secondary | ICD-10-CM | POA: Diagnosis present

## 2020-06-07 DIAGNOSIS — E785 Hyperlipidemia, unspecified: Secondary | ICD-10-CM | POA: Diagnosis not present

## 2020-06-07 LAB — CBC WITH DIFFERENTIAL (CANCER CENTER ONLY)
Abs Immature Granulocytes: 0.03 10*3/uL (ref 0.00–0.07)
Basophils Absolute: 0 10*3/uL (ref 0.0–0.1)
Basophils Relative: 2 %
Eosinophils Absolute: 0.1 10*3/uL (ref 0.0–0.5)
Eosinophils Relative: 3 %
HCT: 30.1 % — ABNORMAL LOW (ref 36.0–46.0)
Hemoglobin: 10.3 g/dL — ABNORMAL LOW (ref 12.0–15.0)
Immature Granulocytes: 1 %
Lymphocytes Relative: 7 %
Lymphs Abs: 0.2 10*3/uL — ABNORMAL LOW (ref 0.7–4.0)
MCH: 35.8 pg — ABNORMAL HIGH (ref 26.0–34.0)
MCHC: 34.2 g/dL (ref 30.0–36.0)
MCV: 104.5 fL — ABNORMAL HIGH (ref 80.0–100.0)
Monocytes Absolute: 0.4 10*3/uL (ref 0.1–1.0)
Monocytes Relative: 16 %
Neutro Abs: 1.8 10*3/uL (ref 1.7–7.7)
Neutrophils Relative %: 71 %
Platelet Count: 78 10*3/uL — ABNORMAL LOW (ref 150–400)
RBC: 2.88 MIL/uL — ABNORMAL LOW (ref 3.87–5.11)
RDW: 15 % (ref 11.5–15.5)
WBC Count: 2.5 10*3/uL — ABNORMAL LOW (ref 4.0–10.5)
nRBC: 1.2 % — ABNORMAL HIGH (ref 0.0–0.2)

## 2020-06-07 LAB — CMP (CANCER CENTER ONLY)
ALT: 31 U/L (ref 0–44)
AST: 45 U/L — ABNORMAL HIGH (ref 15–41)
Albumin: 3.3 g/dL — ABNORMAL LOW (ref 3.5–5.0)
Alkaline Phosphatase: 270 U/L — ABNORMAL HIGH (ref 38–126)
Anion gap: 6 (ref 5–15)
BUN: 28 mg/dL — ABNORMAL HIGH (ref 8–23)
CO2: 23 mmol/L (ref 22–32)
Calcium: 8.9 mg/dL (ref 8.9–10.3)
Chloride: 109 mmol/L (ref 98–111)
Creatinine: 1.4 mg/dL — ABNORMAL HIGH (ref 0.44–1.00)
GFR, Estimated: 36 mL/min — ABNORMAL LOW (ref >60)
Glucose, Bld: 125 mg/dL — ABNORMAL HIGH (ref 70–99)
Potassium: 3.3 mmol/L — ABNORMAL LOW (ref 3.5–5.1)
Sodium: 138 mmol/L (ref 135–145)
Total Bilirubin: 2.7 mg/dL — ABNORMAL HIGH (ref 0.3–1.2)
Total Protein: 7 g/dL (ref 6.5–8.1)

## 2020-06-07 MED ORDER — BORTEZOMIB CHEMO SQ INJECTION 3.5 MG (2.5MG/ML)
1.5000 mg/m2 | Freq: Once | INTRAMUSCULAR | Status: AC
  Administered 2020-06-07: 2.25 mg via SUBCUTANEOUS
  Filled 2020-06-07: qty 0.9

## 2020-06-07 MED ORDER — SODIUM CHLORIDE 0.9 % IV SOLN
Freq: Once | INTRAVENOUS | Status: AC
  Filled 2020-06-07: qty 250

## 2020-06-07 MED ORDER — SODIUM CHLORIDE 0.9 % IV SOLN
40.0000 mg | Freq: Once | INTRAVENOUS | Status: AC
  Administered 2020-06-07: 40 mg via INTRAVENOUS
  Filled 2020-06-07: qty 4

## 2020-06-07 MED ORDER — SODIUM CHLORIDE 0.9 % IV SOLN
225.0000 mg/m2 | Freq: Once | INTRAVENOUS | Status: AC
  Administered 2020-06-07: 340 mg via INTRAVENOUS
  Filled 2020-06-07: qty 17

## 2020-06-07 MED ORDER — PALONOSETRON HCL INJECTION 0.25 MG/5ML
0.2500 mg | Freq: Once | INTRAVENOUS | Status: AC
  Administered 2020-06-07: 0.25 mg via INTRAVENOUS

## 2020-06-07 NOTE — Patient Instructions (Signed)
Cancer Center Discharge Instructions for Patients Receiving Chemotherapy  Today you received the following chemotherapy agents Bortezomib (VELCADE) & Cyclophosphamide (CYTOXAN).  To help prevent nausea and vomiting after your treatment, we encourage you to take your nausea medication as prescribed.   If you develop nausea and vomiting that is not controlled by your nausea medication, call the clinic.   BELOW ARE SYMPTOMS THAT SHOULD BE REPORTED IMMEDIATELY:  *FEVER GREATER THAN 100.5 F  *CHILLS WITH OR WITHOUT FEVER  NAUSEA AND VOMITING THAT IS NOT CONTROLLED WITH YOUR NAUSEA MEDICATION  *UNUSUAL SHORTNESS OF BREATH  *UNUSUAL BRUISING OR BLEEDING  TENDERNESS IN MOUTH AND THROAT WITH OR WITHOUT PRESENCE OF ULCERS  *URINARY PROBLEMS  *BOWEL PROBLEMS  UNUSUAL RASH Items with * indicate a potential emergency and should be followed up as soon as possible.  Feel free to call the clinic should you have any questions or concerns. The clinic phone number is [phone].  Please show the CHEMO ALERT CARD at check-in to the Emergency Department and triage nurse.   

## 2020-06-07 NOTE — Progress Notes (Signed)
Per Dr. Lorenso Courier, okay to proceed with platelets for 79.

## 2020-06-10 LAB — CUP PACEART REMOTE DEVICE CHECK
Date Time Interrogation Session: 20211008233808
Implantable Pulse Generator Implant Date: 20191022

## 2020-06-14 ENCOUNTER — Inpatient Hospital Stay: Payer: Medicaid Other

## 2020-06-14 ENCOUNTER — Encounter: Payer: Self-pay | Admitting: Hematology and Oncology

## 2020-06-14 ENCOUNTER — Other Ambulatory Visit: Payer: Self-pay | Admitting: Hematology and Oncology

## 2020-06-14 ENCOUNTER — Inpatient Hospital Stay (HOSPITAL_BASED_OUTPATIENT_CLINIC_OR_DEPARTMENT_OTHER): Payer: Medicaid Other | Admitting: Hematology and Oncology

## 2020-06-14 ENCOUNTER — Other Ambulatory Visit: Payer: Self-pay

## 2020-06-14 VITALS — BP 100/69 | HR 60 | Temp 97.7°F | Resp 18 | Ht 60.0 in | Wt 118.4 lb

## 2020-06-14 DIAGNOSIS — E854 Organ-limited amyloidosis: Secondary | ICD-10-CM

## 2020-06-14 DIAGNOSIS — Z5112 Encounter for antineoplastic immunotherapy: Secondary | ICD-10-CM | POA: Diagnosis not present

## 2020-06-14 DIAGNOSIS — I43 Cardiomyopathy in diseases classified elsewhere: Secondary | ICD-10-CM

## 2020-06-14 DIAGNOSIS — N1831 Chronic kidney disease, stage 3a: Secondary | ICD-10-CM | POA: Diagnosis not present

## 2020-06-14 DIAGNOSIS — I5042 Chronic combined systolic (congestive) and diastolic (congestive) heart failure: Secondary | ICD-10-CM | POA: Diagnosis not present

## 2020-06-14 LAB — CBC WITH DIFFERENTIAL (CANCER CENTER ONLY)
Abs Immature Granulocytes: 0.05 10*3/uL (ref 0.00–0.07)
Basophils Absolute: 0 10*3/uL (ref 0.0–0.1)
Basophils Relative: 1 %
Eosinophils Absolute: 0.1 10*3/uL (ref 0.0–0.5)
Eosinophils Relative: 2 %
HCT: 31.5 % — ABNORMAL LOW (ref 36.0–46.0)
Hemoglobin: 11 g/dL — ABNORMAL LOW (ref 12.0–15.0)
Immature Granulocytes: 2 %
Lymphocytes Relative: 7 %
Lymphs Abs: 0.2 10*3/uL — ABNORMAL LOW (ref 0.7–4.0)
MCH: 35.6 pg — ABNORMAL HIGH (ref 26.0–34.0)
MCHC: 34.9 g/dL (ref 30.0–36.0)
MCV: 101.9 fL — ABNORMAL HIGH (ref 80.0–100.0)
Monocytes Absolute: 0.5 10*3/uL (ref 0.1–1.0)
Monocytes Relative: 18 %
Neutro Abs: 1.8 10*3/uL (ref 1.7–7.7)
Neutrophils Relative %: 70 %
Platelet Count: 99 10*3/uL — ABNORMAL LOW (ref 150–400)
RBC: 3.09 MIL/uL — ABNORMAL LOW (ref 3.87–5.11)
RDW: 15.2 % (ref 11.5–15.5)
WBC Count: 2.6 10*3/uL — ABNORMAL LOW (ref 4.0–10.5)
nRBC: 0 % (ref 0.0–0.2)

## 2020-06-14 LAB — CMP (CANCER CENTER ONLY)
ALT: 36 U/L (ref 0–44)
AST: 50 U/L — ABNORMAL HIGH (ref 15–41)
Albumin: 3.5 g/dL (ref 3.5–5.0)
Alkaline Phosphatase: 302 U/L — ABNORMAL HIGH (ref 38–126)
Anion gap: 7 (ref 5–15)
BUN: 29 mg/dL — ABNORMAL HIGH (ref 8–23)
CO2: 22 mmol/L (ref 22–32)
Calcium: 9.2 mg/dL (ref 8.9–10.3)
Chloride: 106 mmol/L (ref 98–111)
Creatinine: 1.35 mg/dL — ABNORMAL HIGH (ref 0.44–1.00)
GFR, Estimated: 38 mL/min — ABNORMAL LOW (ref >60)
Glucose, Bld: 91 mg/dL (ref 70–99)
Potassium: 3.8 mmol/L (ref 3.5–5.1)
Sodium: 135 mmol/L (ref 135–145)
Total Bilirubin: 2.8 mg/dL — ABNORMAL HIGH (ref 0.3–1.2)
Total Protein: 7.2 g/dL (ref 6.5–8.1)

## 2020-06-14 LAB — LACTATE DEHYDROGENASE: LDH: 399 U/L — ABNORMAL HIGH (ref 98–192)

## 2020-06-14 MED ORDER — SODIUM CHLORIDE 0.9 % IV SOLN
225.0000 mg/m2 | Freq: Once | INTRAVENOUS | Status: AC
  Administered 2020-06-14: 340 mg via INTRAVENOUS
  Filled 2020-06-14: qty 17

## 2020-06-14 MED ORDER — PALONOSETRON HCL INJECTION 0.25 MG/5ML
0.2500 mg | Freq: Once | INTRAVENOUS | Status: AC
  Administered 2020-06-14: 0.25 mg via INTRAVENOUS

## 2020-06-14 MED ORDER — SODIUM CHLORIDE 0.9 % IV SOLN
Freq: Once | INTRAVENOUS | Status: AC
  Filled 2020-06-14: qty 250

## 2020-06-14 MED ORDER — SODIUM CHLORIDE 0.9 % IV SOLN
40.0000 mg | Freq: Once | INTRAVENOUS | Status: AC
  Administered 2020-06-14: 40 mg via INTRAVENOUS
  Filled 2020-06-14: qty 4

## 2020-06-14 MED ORDER — LOPERAMIDE HCL 2 MG PO CAPS: 2.0000 mg | ORAL_CAPSULE | ORAL | 1 refills | Status: AC | PRN

## 2020-06-14 MED ORDER — BORTEZOMIB CHEMO SQ INJECTION 3.5 MG (2.5MG/ML)
1.5000 mg/m2 | Freq: Once | INTRAMUSCULAR | Status: AC
  Administered 2020-06-14: 2.25 mg via SUBCUTANEOUS
  Filled 2020-06-14: qty 0.9

## 2020-06-14 MED ORDER — PALONOSETRON HCL INJECTION 0.25 MG/5ML
INTRAVENOUS | Status: AC
  Filled 2020-06-14: qty 5

## 2020-06-14 NOTE — Progress Notes (Signed)
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 Ordered during evaluation for cardiomyopathy.  2) 03/13/2020: establish care with Dr. 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 The patient's last visit was on 05/31/2020. In the interim since the last visit she has started treatment with Cycle 2 of CyBorD chemotherapy.   On exam today communication with the patient is assisted by an approved hospital [place] interpreter. Mikayla Reynolds reports that she has been well in the interim since her last visit.  She reports that she is not having any issues with nausea or vomiting and that she is not having any continued soreness of her legs.  She does report that she is having numerous loose stools the day of and the day after chemotherapy.  She reports that these can be up to 10 loose stools per day, however she is delighted to have these as she is normally quite constipated.  She notes that there is no blood in the stool and that they are normally watery in  consistency but the later stools tend to be more like honey.  Otherwise she denies any issues with neuropathy, fevers, chills, sweats, nausea, abdominal pain, or other symptoms.  She does endorse having some fatigue and low energy, but is otherwise quite well.  A full 10 point ROS is listed below.  MEDICAL HISTORY:  Past Medical History:  Diagnosis Date   Abnormal liver function    Anemia, unspecified    Chronic combined systolic and diastolic CHF (congestive heart failure) (HCC)    CKD (chronic kidney disease), stage III ([place]) 02/19/2018   Elevated troponin 12/27/2017   Hyperlipidemia    Junctional bradycardia    Mitral regurgitation    Pre-diabetes    Renal mass    Stroke [facility])     SURGICAL HISTORY: Past Surgical History:  Procedure Laterality Date   abdominal wall surgery  2017   APPENDECTOMY     LOOP RECORDER INSERTION N/A 06/23/2018   Procedure: LOOP RECORDER INSERTION;  Surgeon: [name], MD;  Location: [facility];  Service: Cardiovascular;  Laterality: N/A;   TEE WITHOUT CARDIOVERSION N/A 06/23/2018   Procedure: TRANSESOPHAGEAL ECHOCARDIOGRAM (TEE);  Surgeon: [name], MD;  Location: [facility] ENDOSCOPY;  Service: Cardiovascular;  Laterality: N/A;    SOCIAL HISTORY: Social History   Socioeconomic History   Marital status: Widowed    Spouse name: Not on file   Number of children: Not on file   Years of education: Not on file   Highest education level: Not on file  Occupational History   Not on file  Tobacco Use   Smoking status: Never Smoker   Smokeless tobacco: Never Used  Vaping Use  Vaping Use: Never used  Substance and Sexual Activity   Alcohol use: Not Currently   Drug use: Not Currently   Sexual activity: Not Currently  Other Topics Concern   Not on file  Social History Narrative   Not on file   Social Determinants of Health   Financial Resource Strain:    Difficulty of Paying Living Expenses: Not on file    Food Insecurity:    Worried About [place] in the Last Year: Not on file   Ran Out of Food in the Last Year: Not on file  Transportation Needs:    Lack of Transportation (Medical): Not on file   Lack of Transportation (Non-Medical): Not on file  Physical Activity:    Days of Exercise per Week: Not on file   Minutes of Exercise per Session: Not on file  Stress:    Feeling of Stress : Not on file  Social Connections:    Frequency of Communication with Friends and Family: Not on file   Frequency of Social Gatherings with Friends and Family: Not on file   Attends Religious Services: Not on file   Active Member of Clubs or Organizations: Not on file   Attends Archivist Meetings: Not on file   Marital Status: Not on file  Intimate Partner Violence:    Fear of Current or Ex-Partner: Not on file   Emotionally Abused: Not on file   Physically Abused: Not on file   Sexually Abused: Not on file    FAMILY HISTORY: Family History  Problem Relation Age of Onset   Healthy Sister    Healthy Brother     ALLERGIES:  has No Known Allergies.  MEDICATIONS:  Current Outpatient Medications  Medication Sig Dispense Refill   acyclovir (ZOVIRAX) 400 MG tablet Take 1 tablet (400 mg total) by mouth daily. 30 tablet 3   aspirin EC 81 MG tablet Take 1 tablet (81 mg total) by mouth daily. Swallow whole. 90 tablet 3   atorvastatin (LIPITOR) 40 MG tablet Take 1 tablet (40 mg total) by mouth daily at 6 PM. 90 tablet 3   furosemide (LASIX) 20 MG tablet Take 1 tablet (20 mg total) by mouth every other day. 90 tablet 3   Methylcellulose, Laxative, (FIBER THERAPY) 500 MG TABS Take by mouth daily.     Polyethylene Glycol 3350 (MIRALAX PO) Take by mouth.     potassium chloride SA (KLOR-CON) 20 MEQ tablet Take 2 tablets by mouth for 2 days then hold 30 tablet 0   spironolactone (ALDACTONE) 25 MG tablet Take 0.5 tablets (12.5 mg total) by mouth daily. 45 tablet 3    No current facility-administered medications for this visit.    REVIEW OF SYSTEMS:   Constitutional: ( - ) fevers, ( - )  chills , ( - ) night sweats Eyes: ( - ) blurriness of vision, ( - ) double vision, ( - ) watery eyes Ears, nose, mouth, throat, and face: ( - ) mucositis, ( - ) sore throat Respiratory: ( - ) cough, ( - ) dyspnea, ( - ) wheezes Cardiovascular: ( - ) palpitation, ( - ) chest discomfort, ( - ) lower extremity swelling Gastrointestinal:  ( - ) nausea, ( - ) heartburn, ( - ) change in bowel habits Skin: ( - ) abnormal skin rashes Lymphatics: ( - ) new lymphadenopathy, ( - ) easy bruising Neurological: ( - ) numbness, ( - ) tingling, ( - ) new weaknesses Behavioral/Psych: ( - )  mood change, ( - ) new changes  All other systems were reviewed with the patient and are negative.  PHYSICAL EXAMINATION: ECOG PERFORMANCE STATUS: 1 - Symptomatic but completely ambulatory  Vitals:   06/14/20 1156  BP: 100/69  Pulse: 60  Resp: 18  Temp: 97.7 F (36.5 C)  SpO2: 100%   Filed Weights   06/14/20 1156  Weight: 118 lb 6.4 oz (53.7 kg)    GENERAL: well appearing elderly [place] female. alert, no distress and comfortable SKIN: skin color, texture, turgor are normal, no rashes or significant lesions EYES: conjunctiva are pink and non-injected, sclera clear LUNGS: clear to auscultation and percussion with normal breathing effort HEART: regular rate & rhythm and no murmurs and no lower extremity edema Musculoskeletal: no cyanosis of digits and no clubbing  PSYCH: alert & oriented x 3, fluent speech NEURO: no focal motor/sensory deficits  LABORATORY DATA:  I have reviewed the data as listed CBC Latest Ref Rng & Units 06/14/2020 06/07/2020 05/31/2020  WBC 4.0 - 10.5 K/uL 2.6(L) 2.5(L) 2.9(L)  Hemoglobin 12.0 - 15.0 g/dL 11.0(L) 10.3(L) 10.3(L)  Hematocrit 36 - 46 % 31.5(L) 30.1(L) 29.7(L)  Platelets 150 - 400 K/uL 99(L) 78(L) 97(L)    CMP Latest Ref Rng & Units  06/14/2020 06/07/2020 05/31/2020  Glucose 70 - 99 mg/dL 91 125(H) 128(H)  BUN 8 - 23 mg/dL 29(H) 28(H) 24(H)  Creatinine 0.44 - 1.00 mg/dL 1.35(H) 1.40(H) 1.40(H)  Sodium 135 - 145 mmol/L 135 138 134(L)  Potassium 3.5 - 5.1 mmol/L 3.8 3.3(L) 4.0  Chloride 98 - 111 mmol/L 106 109 106  CO2 22 - 32 mmol/L 22 23 20(L)  Calcium 8.9 - 10.3 mg/dL 9.2 8.9 8.5(L)  Total Protein 6.5 - 8.1 g/dL 7.2 7.0 6.8  Total Bilirubin 0.3 - 1.2 mg/dL 2.8(H) 2.7(H) 2.9(H)  Alkaline Phos 38 - 126 U/L 302(H) 270(H) 240(H)  AST 15 - 41 U/L 50(H) 45(H) 51(H)  ALT 0 - 44 U/L 36 31 34    Lab Results  Component Value Date   MPROTEIN 2.7 (H) 03/13/2020   MPROTEIN 2.4 (H) 03/01/2020   Lab Results  Component Value Date   KPAFRELGTCHN 23.8 (H) 03/13/2020   LAMBDASER 34.5 (H) 03/13/2020   KAPLAMBRATIO 7.27 03/23/2020   KAPLAMBRATIO 0.69 03/13/2020     PATHOLOGY: Surgical Pathology  CASE: [id number]  PATIENT: Mikayla Reynolds  Bone Marrow Report   Clinical History: None provided   DIAGNOSIS:   BONE MARROW, ASPIRATE, CLOT, CORE:  - Cellular marrow involved by a plasma cell neoplasm (20%)  - See comment and microscopic description below   PERIPHERAL BLOOD:  - Pancytopenia  - See complete blood cell count   COMMENT:   The bone marrow biopsy is involved by a plasma cell neoplasm, 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 Correlation with radiographic findings  and laboratory data is required to characterize this plasma cell  neoplasm. There was no amyloid detected on the core biopsy or clot  section; however, the sample was suboptimal for evaluation.   MICROSCOPIC DESCRIPTION:   PERIPHERAL BLOOD SMEAR: The peripheral blood has a macrocytic anemia,  leukopenia and thrombocytopenia. Red cell morphology shows mild  anisocytosis. There is no rouleaux formation. Leukocytes are  morphologically unremarkable. Circulating plasma cellsare not  identified.   BONE MARROW ASPIRATE: Spicular, cellular  and adequate for evaluation  Erythroid precursors: Orderly maturation without overt dysplasia  Granulocytic precursors: Orderly maturation without overt dysplasia  Megakaryocytes: Qualitatively and quantitatively unremarkable  Lymphocytes/plasma cells: There are increased plasma  cells (16% by  manual differential counts) some are enlarged with multi nucleated forms  present. There is no lymphocytosis.   TOUCH PREPARATIONS: Markedly hypocellular with no additional findings as  compared to the aspirate smear   CLOT AND BIOPSY: Examination of the bone marrow core biopsy and clot  section reveals a variably cellular marrow (30%) with trilineage  hematopoiesis. CD138 highlights clusters of plasma cells (approximately  20% of the total marrow cellularity) which are lambda restricted by  kappa and lambda in situ hybridization. There is a mixed population of  B and T cells by CD20 and CD3 immunohistochemistry respectively. [place]  red performed on the core biopsy and clot section is NEGATIVE.   IRON STAIN: Iron stains are performed on a bone marrow aspirate or touch  imprint smear and section of clot. The controls stained appropriately.     Storage Iron: Present    Ring Sideroblasts: Not identified   ADDITIONAL DATA/TESTING: Cytogenetics is pending and will be reported  separately.   CELL COUNT DATA:   Bone Marrow count performed on 500 cells shows:  Blasts:  0%  Myeloid: 44%  Promyelocytes: 0%  Erythroid:   28%  Myelocytes:  7%  Lymphocytes:  7%  Metamyelocytes:   0%  Plasma cells: 16%  Bands:  8%  Neutrophils:  23% M:E ratio:   1.57  Eosinophils:  5%  Basophils:   1%  Monocytes:   5%   Lab Data: CBC performed on 03/23/2020 shows:  WBC: 3.5 k/uL Neutrophils:  59%  Hgb: 11.0 g/dL Lymphocytes:  20%  HCT: 33.1 %  Monocytes:   14%  MCV: 105.4 fL Eosinophils:  6%  RDW: 14.6 %  Basophils:   1%  PLT: 139 k/uL   GROSS DESCRIPTION:   A.  Bone marrow aspirate smear.   B. Received in B-plus fixative is a 1.2 x 1 x 0.1 cm aggregate of  tan-brown tissue, submitted in 1 cassette.   C. Received in B-plus fixative is a 1.2 cm in length by 0.2 cm diameter  hard tan-brown soft tissue core, submitted in 1 cassette following  decalcification in Immunocal. (AK 03/23/2020)%    Final Diagnosis performed by Thressa Sheller, MD.  Electronically signed  03/28/2020    RADIOGRAPHIC STUDIES: I have personally reviewed the radiological images as listed and agreed with the findings in the report: no evidence of lytic lesions on bone scan.  CUP PACEART REMOTE DEVICE CHECK  Result Date: 06/10/2020 ILR summary report received. Battery status OK. Normal device function. No new symptom episodes, tachy episodes, brady, or pause episodes. 1 AF episode, false. Monthly summary reports and ROV/PRN   ASSESSMENT & PLAN Mikayla Reynolds 79 y.o. female with medical history significant for CHF, CKD, HLD, CVA, and DM type II who presents for evaluation of a monoclonal gammopathy. After review of the labs, discussion with the patient, and reviewed the prior records the patient's findings are concerning for a plasma cell dyscrasia and possible amyloidosis.The patient has cardiac dysfunction with an equivocal myocardial amyloid imaging study performed on 02/28/2020 and a bone marrow biopsy on 03/23/2020 which showed 20% plasma cell population (but no evidence of amyloidosis). Cardiac MRI on 04/19/2020 is highly concerning for amyloid infiltrate. Based on this, we can presumptively start treatment while we await confirmation of amyloid with a tissue sample.   On exam today Mikayla Reynolds is doing well other than some diarrhea and fatigue.  We discussed that more than 3 stools per day is not appropriate and  that she has had considerable weight loss of approximately 12 pounds since her last visit.  This may be a combination of fluid loss with her diuretic therapies as well as  weight loss from the diarrhea and poor appetite.  We will set up an appointment with nutrition in order to help address this.  In order to confirm the diagnosis of amyloidosis we will need to obtain further tissue samples. Unfortunately the fat pad biopsy is negative, we need to consider biopsy of the liver versus cardiac biopsy in order to help confirm the diagnosis.  Based on her plasma cells and imaging there is strong supporting evidence to start treatment now while we await these biopsy results.  We require the amyloid protein in order to effectively confirm the diagnosis.   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  As such the treatment of choice would be to target her plasma cell population with a triplet or quadruplet therapy.  Therapy of choice in this case would consist of daratumumab, Velcade, cyclophosphamide, and dexamethasone.  Given the patient's advanced age I would recommend that we start initially with the Velcade, cyclophosphamide, and Dex and then add the daratumumab on if she was shown to tolerate the treatment.  I discussed the side effects of this chemotherapy with the patient including neuropathy, elevated blood pressure, drop in blood counts, hypersensitivity reaction, chest tightness, increased infection risk, and fatigue.  The patient voiced her understanding of these findings and is agreeable to move forward with triple therapy with the intention of advancing to quadruple therapy if well tolerated.  The regimen of choice will be daratumumab, bortezomib, cyclophosphamide and dexamethasone per the ANDROMEDA Study ( Blood. 2020 Jul 2;136(1):71-80). We will modify this to start with CyBorD with the addition of daratumumab if she is shown to tolerate initial therapy.   Treatment consists of: Cyclophosphamide 300 mg/m2 intravenously and bortezomib 1.3 mg/m2 subcutaneously were given on days 1, 8, 15, and 22 of each 28 day cycle  for up to 6 cycles. Dexamethasone 40 mg (starting dose) was given orally or intravenously weekly for each cycle for up to 6 cycles. DARA [place] was administered in a single, premixed vial and given by manual Taylor injection over the course of 3 to 5 minutes weekly in cycles 1 to 2, every 2 weeks in cycles 3 to 6, and every 4 weeks thereafter as monotherapy for a maximum of 2 years.  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 In order to confirm the diagnosis. In order to confirm diagnosis the next best step would be a biopsy of cardiac tissue. This may not be feasible moving forward.  --given the ample evidence of an AL amyloidosis will start treatment while awaiting final pathological diagnosis. Further delay may result in worsening heart function/decline.  --The patient's elevations in LFTs may also indicate amyloid involvement of the liver (or more likely congestive hepatopathy).  --monoclonal gammopathy reveals no CRAB criteria. 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  -- given the patient's condition she is a good candidate for systemic treatment. We will start while we await there results of the cardiac biopsy.  --RTC in 2 weeks, will continue CyBorD (dara can be added later).   #Weight Loss --patient is down 12 lbs from her last visit 2 weeks ago --may be  a combination of fluid loss (diarrhea/lasix) and poor appetite --kidney function is appropriate for continued treatment. 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  --counts are stable on exam today --continue to monitor   Orders Placed  This Encounter  Procedures   Ambulatory referral to Nutrition and Diabetic Education    Referral Priority:   Routine    Referral Type:   Consultation    Referral Reason:   Specialty Services Required    Number of Visits Requested:   1   All questions were answered. The patient knows to call the clinic with any problems, questions or concerns.  A total of more than 30 minutes were spent on this encounter and over half of that time was spent on counseling and coordination of care as outlined above.   Ledell Peoples, MD Department of Hematology/Oncology [place] [facility] Phone: [phone] Pager: [phone] Email: [email or web address]  06/14/2020 12:55 PM

## 2020-06-14 NOTE — Patient Instructions (Signed)
[place] Discharge Instructions for Patients Receiving Chemotherapy  Today you received the following chemotherapy agents: bortezomib and cyclophosphamide.  To help prevent nausea and vomiting after your treatment, we encourage you to take your nausea medication as directed.   If you develop nausea and vomiting that is not controlled by your nausea medication, call the clinic.   BELOW ARE SYMPTOMS THAT SHOULD BE REPORTED IMMEDIATELY:  *FEVER GREATER THAN 100.5 F  *CHILLS WITH OR WITHOUT FEVER  NAUSEA AND VOMITING THAT IS NOT CONTROLLED WITH YOUR NAUSEA MEDICATION  *UNUSUAL SHORTNESS OF BREATH  *UNUSUAL BRUISING OR BLEEDING  TENDERNESS IN MOUTH AND THROAT WITH OR WITHOUT PRESENCE OF ULCERS  *URINARY PROBLEMS  *BOWEL PROBLEMS  UNUSUAL RASH Items with * indicate a potential emergency and should be followed up as soon as possible.  Feel free to call the clinic should you have any questions or concerns. The clinic phone number is [phone].  Please show the [place] at check-in to the Emergency Department and triage nurse.

## 2020-06-14 NOTE — Progress Notes (Signed)
Per Dr Lorenso Courier, ok to treat with platelets 99 and tbili 2.8

## 2020-06-15 ENCOUNTER — Encounter (HOSPITAL_COMMUNITY): Payer: Self-pay | Admitting: Internal Medicine

## 2020-06-15 ENCOUNTER — Ambulatory Visit (HOSPITAL_COMMUNITY)
Admission: RE | Admit: 2020-06-15 | Discharge: 2020-06-15 | Disposition: A | Discharge status: Home / Self Care | Payer: Medicaid Other | Source: Ambulatory Visit | Attending: Internal Medicine | Admitting: Internal Medicine

## 2020-06-15 VITALS — BP 80/50 | HR 64 | Wt 122.0 lb

## 2020-06-15 DIAGNOSIS — E785 Hyperlipidemia, unspecified: Secondary | ICD-10-CM | POA: Insufficient documentation

## 2020-06-15 DIAGNOSIS — I5022 Chronic systolic (congestive) heart failure: Secondary | ICD-10-CM

## 2020-06-15 DIAGNOSIS — N183 Chronic kidney disease, stage 3 unspecified: Secondary | ICD-10-CM | POA: Diagnosis not present

## 2020-06-15 DIAGNOSIS — Z7982 Long term (current) use of aspirin: Secondary | ICD-10-CM | POA: Insufficient documentation

## 2020-06-15 DIAGNOSIS — I43 Cardiomyopathy in diseases classified elsewhere: Secondary | ICD-10-CM | POA: Insufficient documentation

## 2020-06-15 DIAGNOSIS — I340 Nonrheumatic mitral (valve) insufficiency: Secondary | ICD-10-CM | POA: Insufficient documentation

## 2020-06-15 DIAGNOSIS — Z79899 Other long term (current) drug therapy: Secondary | ICD-10-CM | POA: Insufficient documentation

## 2020-06-15 DIAGNOSIS — E854 Organ-limited amyloidosis: Secondary | ICD-10-CM | POA: Insufficient documentation

## 2020-06-15 DIAGNOSIS — I10 Essential (primary) hypertension: Secondary | ICD-10-CM | POA: Diagnosis not present

## 2020-06-15 DIAGNOSIS — I313 Pericardial effusion (noninflammatory): Secondary | ICD-10-CM | POA: Insufficient documentation

## 2020-06-15 DIAGNOSIS — I5042 Chronic combined systolic (congestive) and diastolic (congestive) heart failure: Secondary | ICD-10-CM | POA: Diagnosis not present

## 2020-06-15 DIAGNOSIS — N1832 Chronic kidney disease, stage 3b: Secondary | ICD-10-CM | POA: Insufficient documentation

## 2020-06-15 DIAGNOSIS — D631 Anemia in chronic kidney disease: Secondary | ICD-10-CM | POA: Insufficient documentation

## 2020-06-15 DIAGNOSIS — Z8673 Personal history of transient ischemic attack (TIA), and cerebral infarction without residual deficits: Secondary | ICD-10-CM | POA: Diagnosis not present

## 2020-06-15 LAB — KAPPA/LAMBDA LIGHT CHAINS
Kappa free light chain: 18.5 mg/L (ref 3.3–19.4)
Kappa, lambda light chain ratio: 1.09 (ref 0.26–1.65)
Lambda free light chains: 16.9 mg/L (ref 5.7–26.3)

## 2020-06-15 LAB — BETA 2 MICROGLOBULIN, SERUM: Beta-2 Microglobulin: 7.6 mg/L — ABNORMAL HIGH (ref 0.6–2.4)

## 2020-06-15 MED ORDER — MIDODRINE HCL 5 MG PO TABS: 5.0000 mg | ORAL_TABLET | Freq: Three times a day (TID) | ORAL | 6 refills | Status: AC

## 2020-06-15 NOTE — Progress Notes (Addendum)
Advanced Heart Failure Clinic Note   Date:  04/24/2020   ID:  Mikayla Reynolds, DOB 11/25/1940, MRN [id number]  Location: Home  Provider location: [facility] Type of Visit: Established patient  PCP:  Ladell Pier, MD  Cardiologist:  Candee Furbish, MD Primary HF: Minard [place]irons  Chief Complaint: Heart Failure follow-up   HPI:  Mikayla Reynolds is a 79 y.o. female with history of CKD stage III, prediabetes, hyperlipidemia, cryptogenic stroke s/p ILR, renal mass, chronic combined CHF, moderate mitral regurgitation who is referred by Melina Copa PA-C for further evaluation of CHF.   She was initially seen by Cardiology in 12/2017 due to elevated troponin in context of stroke. It was doubted that this was related to ACS; recommendation was to consider OP stress test.. She had ILR inserted in 06/2018 as part of work-up for stroke. At that time, EF was 50-55% by TEE.   She subsequently followed with EP. In the summer of 2020, she developed issues with poor appetite and weight loss as well as progressive abdominal fullness and anemia. Her PCP obtained an echocardiogram 04/06/2019 which showed newly decreased LV systolic function, EF 41-28%, moderate asymmetric LV hypertrophy, diastolic dysfunction and mild to moderate mitral regurgitation. She was also found to have a renal mass of unclear etiology. She was started on ACEI and Lasix. Initial invasive work-up was deferred due to renal mass pending work-up. She was felt to be a poor candidate for renal surgery due to overall frailty, systolic heart failure and poor HD candidate if needed postop (if the kidney would need to be removed). [name], EP PA had seen the patient and had spoken with Dr. Ellison Hughs at [facility], who noted the cyst had a 50-60% chance of being malignant.   Repeat echo 08/2019 demonstrated persistent dysfunction with EF 25-30% severe LVH, + RVH grade III DD, mild LAE, moderate mitral  regurgitation, moderate to severe TR, moderately elevated PASP, valve thickening - findings concerning for amyloidosis.  Repeat CT scan 2/21 showed decrease in size of renal cyst. Felt not to be malignant.   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 cMRI very suggestive of cardiac amyloid. She was referred to Dr. Lorenso Courier. BM biopsy came back with 20% plasma cells but bone marrow staining negative for amyloid. Fat pad biopsy also negative. Based on results of testing has been started on empiric therapy with daratumumab, bortezomib, cyclophosphamide and dexamethasone   Here for f/u with her son. Feels very weak. Having diarrhea. Breathing ok.    Studies:   cMRI 818/21 1. Findings consistent with cardiac amyloidosis, including severe LVH, diffuse late gadolinium enhancement, and marked elevation in extracellular volume (71%)  2.  Mild LV dilatation with mild systolic dysfunction (EF 78%)  3.  Mild RV dilatation with mild systolic dysfunction (EF 67%)  4. Mild to moderate mitral regurgitation (regurgitant volume 18cc, regurgitant fraction 31%)  5.  Small pericardial effusion  She presents today for routine follow-up. Her grandaughter is with her and serves as Optometrist. She feels very tired and weak. Appetite is poor. She has SOB with mild exertion. Mild orthopnea. No PND. Mild swelling in her left foot. Taking lasix every day.      Past Medical History:  Diagnosis Date  .  Abnormal liver function   . Anemia, unspecified   . Chronic combined systolic and diastolic CHF (congestive heart failure) ([place])   . CKD (chronic kidney disease), stage III ([place]) 02/19/2018  . Elevated troponin 12/27/2017  . Hyperlipidemia   . Junctional bradycardia   . Mitral regurgitation   . Pre-diabetes   . Renal mass   .  Stroke [facility])     Current Outpatient Medications  Medication Sig Dispense Refill  . acyclovir (ZOVIRAX) 400 MG tablet Take 1 tablet (400 mg total) by mouth daily. 30 tablet 3  . aspirin EC 81 MG tablet Take 1 tablet (81 mg total) by mouth daily. Swallow whole. 90 tablet 3  . atorvastatin (LIPITOR) 40 MG tablet Take 1 tablet (40 mg total) by mouth daily at 6 PM. 90 tablet 3  . furosemide (LASIX) 20 MG tablet Take 1 tablet (20 mg total) by mouth every other day. 90 tablet 3  . Methylcellulose, Laxative, (FIBER THERAPY) 500 MG TABS Take by mouth daily.    . Polyethylene Glycol 3350 (MIRALAX PO) Take by mouth.    . potassium chloride SA (KLOR-CON) 20 MEQ tablet Take 2 tablets by mouth for 2 days then hold 30 tablet 0  . spironolactone (ALDACTONE) 25 MG tablet Take 0.5 tablets (12.5 mg total) by mouth daily. 45 tablet 3  . loperamide (IMODIUM) 2 MG capsule Take 1 capsule (2 mg total) by mouth as needed for diarrhea or loose stools. (Patient not taking: Reported on 06/15/2020) 60 capsule 1   No current facility-administered medications for this encounter.    No Known Allergies    Social History   Socioeconomic History  . Marital status: Widowed    Spouse name: Not on file  . Number of children: Not on file  . Years of education: Not on file  . Highest education level: Not on file  Occupational History  . Not on file  Tobacco Use  . Smoking status: Never Smoker  . Smokeless tobacco: Never Used  Vaping Use  . Vaping Use: Never used  Substance and Sexual Activity  . Alcohol use: Not Currently  . Drug use: Not Currently  . Sexual activity: Not Currently  Other Topics Concern  . Not on file  Social History Narrative  . Not on file   Social Determinants of Health   Financial Resource Strain:   . Difficulty of Paying Living Expenses: Not on file  Food Insecurity:   . Worried About Charity fundraiser in the Last Year: Not on file  . Ran Out of Food in the Last Year: Not on file    Transportation Needs:   . Lack of Transportation (Medical): Not on file  . Lack of Transportation (Non-Medical): Not on file  Physical Activity:   . Days of Exercise per Week: Not on file  . Minutes of Exercise per Session: Not on file  Stress:   . Feeling of Stress : Not on file  Social Connections:   . Frequency of Communication with Friends and Family: Not on file  . Frequency of Social Gatherings with Friends and Family: Not on file  . Attends Religious Services: Not on file  . Active Member of Clubs or Organizations: Not on file  . Attends Archivist Meetings: Not on file  . Marital Status: Not on file  Intimate Partner Violence:   . Fear of Current or Ex-Partner: Not on file  . Emotionally Abused: Not on file  . Physically Abused: Not on  file  . Sexually Abused: Not on file      Family History  Problem Relation Age of Onset  . Healthy Sister   . Healthy Brother    Vitals:   06/15/20 1131  BP: (!) 80/50  Pulse: 64  SpO2: 99%  Weight: 55.3 kg (122 lb)     PHYSICAL EXAM: General: Elderly weak appearing. No resp difficulty HEENT: normal Neck: supple. JVP 6 Carotids 2+ bilat; no bruits. No lymphadenopathy or thryomegaly appreciated. Cor: PMI nondisplaced. Regular rate & rhythm. 2/6 TR Lungs: clear Abdomen: soft, nontender, nondistended. No hepatosplenomegaly. No bruits or masses. Good bowel sounds. Extremities: no cyanosis, clubbing, rash, tr-1+ edema Neuro: alert & orientedx3, cranial nerves grossly intact. moves all 4 extremities w/o difficulty. Affect pleasant   ECG: NSR 63 with PACs 1AVB 253m IVCD 118 ms Personally reviewed   ASSESSMENT AND PLAN:  1. 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 + severe LVH/RVH, moderate to severe TR, valves thickened - given clinical scenario with presence of multiple myeloma and cMRI almost certainly has AL amyloid as cMRI ECV 71% is very specific for cardiac amyloid however BMBx  and fat pad biopsy have been negative.   - Dr. DLorenso Courierand I have discussed possibility of endomyocardial biospy but given her current state and overwhelming evidence for presence of cardiac amyloid, I do not think biopsy is warranted. I will d/w him again to how he feels - cMRI 8/21 EF 41% + LGE and ECV 71% very c/w AL amyloid - Bone marrow Bx 20% plasma cells. Stain negative fo amyloid - Fat pad biopsy negative  - Stable NYHA IIIB volume status ok on current regimen - BP low - start midodrine 5 tid. Place suppor those - Continue lasix and spiro. BP has been to low for Entrests or b-blocker - Prognosis very concerning - Will need repeat echo  2. CKD 3b - follow closely   Signed, DGlori Bickers MD  06/15/2020 12:17 PM  Advanced Heart Failure [id number] and [phone](office) [phone])

## 2020-06-15 NOTE — Addendum Note (Signed)
Encounter addended by: Jolaine Artist, MD on: 06/15/2020 12:08 PM  Actions taken: Clinical Note Signed

## 2020-06-15 NOTE — Addendum Note (Signed)
Encounter addended by: Jolaine Artist, MD on: 06/15/2020 12:27 PM  Actions taken: Level of Service modified, Visit diagnoses modified, Clinical Note Signed

## 2020-06-15 NOTE — Addendum Note (Signed)
Encounter addended by: Jolaine Artist, MD on: 06/15/2020 12:34 PM  Actions taken: Vitals modified, Level of Service modified, Visit diagnoses modified, Clinical Note Signed

## 2020-06-15 NOTE — Patient Instructions (Signed)
Start Midodrine 5 mg Three times a day   Your physician recommends that you schedule a follow-up appointment in: 3-4 months  If you have any questions or concerns before your next appointment please send [place] a message through [place] or call our office at [phone].    TO LEAVE A MESSAGE FOR THE NURSE SELECT OPTION 2, PLEASE LEAVE A MESSAGE INCLUDING: . YOUR NAME . DATE OF BIRTH . CALL BACK NUMBER . REASON FOR CALL**this is important as we prioritize the call backs  [place] AS LONG AS YOU CALL BEFORE 4:00 PM  At the [facility], you and your health needs are our priority. As part of our continuing mission to provide you with exceptional heart care, we have created designated Provider Care Teams. These Care Teams include your primary Cardiologist (physician) and Advanced Practice Providers (APPs- Physician Assistants and Nurse Practitioners) who all work together to provide you with the care you need, when you need it.   You may see any of the following providers on your designated Care Team at your next follow up: Marland Kitchen Dr Glori Bickers . Dr Loralie Champagne . Darrick Grinder, NP . Lyda Jester, PA . Audry Riles, PharmD   Please be sure to bring in all your medications bottles to every appointment.

## 2020-06-15 NOTE — Addendum Note (Signed)
Encounter addended by: Scarlette Calico, RN on: 06/15/2020 12:21 PM  Actions taken: Pharmacy for encounter modified, Order list changed, Clinical Note Signed

## 2020-06-16 LAB — MULTIPLE MYELOMA PANEL, SERUM
Albumin SerPl Elph-Mcnc: 3.6 g/dL (ref 2.9–4.4)
Albumin/Glob SerPl: 1.3 (ref 0.7–1.7)
Alpha 1: 0.3 g/dL (ref 0.0–0.4)
Alpha2 Glob SerPl Elph-Mcnc: 0.6 g/dL (ref 0.4–1.0)
B-Globulin SerPl Elph-Mcnc: 0.8 g/dL (ref 0.7–1.3)
Gamma Glob SerPl Elph-Mcnc: 1.3 g/dL (ref 0.4–1.8)
Globulin, Total: 3 g/dL (ref 2.2–3.9)
IgA: 23 mg/dL — ABNORMAL LOW (ref 64–422)
IgG (Immunoglobin G), Serum: 1583 mg/dL (ref 586–1602)
IgM (Immunoglobulin M), Srm: 43 mg/dL (ref 26–217)
M Protein SerPl Elph-Mcnc: 1.1 g/dL — ABNORMAL HIGH
Total Protein ELP: 6.6 g/dL (ref 6.0–8.5)

## 2020-06-19 ENCOUNTER — Ambulatory Visit (INDEPENDENT_AMBULATORY_CARE_PROVIDER_SITE_OTHER): Payer: Medicaid Other

## 2020-06-19 DIAGNOSIS — I429 Cardiomyopathy, unspecified: Secondary | ICD-10-CM

## 2020-06-21 ENCOUNTER — Inpatient Hospital Stay: Payer: Medicaid Other

## 2020-06-21 ENCOUNTER — Other Ambulatory Visit: Payer: Self-pay

## 2020-06-21 ENCOUNTER — Ambulatory Visit (HOSPITAL_COMMUNITY)
Admission: RE | Admit: 2020-06-21 | Discharge: 2020-06-21 | Disposition: A | Discharge status: Home / Self Care | Payer: Medicaid Other | Source: Ambulatory Visit | Attending: Hematology and Oncology | Admitting: Hematology and Oncology

## 2020-06-21 ENCOUNTER — Other Ambulatory Visit: Payer: Self-pay | Admitting: Hematology and Oncology

## 2020-06-21 VITALS — BP 102/50 | HR 79 | Temp 99.6°F | Resp 18 | Wt 124.0 lb

## 2020-06-21 DIAGNOSIS — D472 Monoclonal gammopathy: Secondary | ICD-10-CM | POA: Insufficient documentation

## 2020-06-21 DIAGNOSIS — I43 Cardiomyopathy in diseases classified elsewhere: Secondary | ICD-10-CM

## 2020-06-21 DIAGNOSIS — M47816 Spondylosis without myelopathy or radiculopathy, lumbar region: Secondary | ICD-10-CM | POA: Diagnosis not present

## 2020-06-21 DIAGNOSIS — M546 Pain in thoracic spine: Secondary | ICD-10-CM | POA: Diagnosis not present

## 2020-06-21 DIAGNOSIS — Z5112 Encounter for antineoplastic immunotherapy: Secondary | ICD-10-CM | POA: Diagnosis not present

## 2020-06-21 DIAGNOSIS — Z8603 Personal history of neoplasm of uncertain behavior: Secondary | ICD-10-CM | POA: Diagnosis not present

## 2020-06-21 DIAGNOSIS — E854 Organ-limited amyloidosis: Secondary | ICD-10-CM

## 2020-06-21 LAB — CBC WITH DIFFERENTIAL (CANCER CENTER ONLY)
Abs Immature Granulocytes: 0.1 10*3/uL — ABNORMAL HIGH (ref 0.00–0.07)
Basophils Absolute: 0.1 10*3/uL (ref 0.0–0.1)
Basophils Relative: 1 %
Eosinophils Absolute: 0 10*3/uL (ref 0.0–0.5)
Eosinophils Relative: 1 %
HCT: 27.9 % — ABNORMAL LOW (ref 36.0–46.0)
Hemoglobin: 10.1 g/dL — ABNORMAL LOW (ref 12.0–15.0)
Immature Granulocytes: 2 %
Lymphocytes Relative: 22 %
Lymphs Abs: 1.2 10*3/uL (ref 0.7–4.0)
MCH: 35.9 pg — ABNORMAL HIGH (ref 26.0–34.0)
MCHC: 36.2 g/dL — ABNORMAL HIGH (ref 30.0–36.0)
MCV: 99.3 fL (ref 80.0–100.0)
Monocytes Absolute: 0.9 10*3/uL (ref 0.1–1.0)
Monocytes Relative: 17 %
Neutro Abs: 3.2 10*3/uL (ref 1.7–7.7)
Neutrophils Relative %: 57 %
Platelet Count: 94 10*3/uL — ABNORMAL LOW (ref 150–400)
RBC: 2.81 MIL/uL — ABNORMAL LOW (ref 3.87–5.11)
RDW: 15 % (ref 11.5–15.5)
WBC Count: 5.6 10*3/uL (ref 4.0–10.5)
nRBC: 0 % (ref 0.0–0.2)

## 2020-06-21 LAB — CMP (CANCER CENTER ONLY)
ALT: 31 U/L (ref 0–44)
AST: 48 U/L — ABNORMAL HIGH (ref 15–41)
Albumin: 3.3 g/dL — ABNORMAL LOW (ref 3.5–5.0)
Alkaline Phosphatase: 333 U/L — ABNORMAL HIGH (ref 38–126)
Anion gap: 8 (ref 5–15)
BUN: 41 mg/dL — ABNORMAL HIGH (ref 8–23)
CO2: 17 mmol/L — ABNORMAL LOW (ref 22–32)
Calcium: 8.6 mg/dL — ABNORMAL LOW (ref 8.9–10.3)
Chloride: 106 mmol/L (ref 98–111)
Creatinine: 1.96 mg/dL — ABNORMAL HIGH (ref 0.44–1.00)
GFR, Estimated: 24 mL/min — ABNORMAL LOW (ref >60)
Glucose, Bld: 111 mg/dL — ABNORMAL HIGH (ref 70–99)
Potassium: 3.8 mmol/L (ref 3.5–5.1)
Sodium: 131 mmol/L — ABNORMAL LOW (ref 135–145)
Total Bilirubin: 2.7 mg/dL — ABNORMAL HIGH (ref 0.3–1.2)
Total Protein: 6.4 g/dL — ABNORMAL LOW (ref 6.5–8.1)

## 2020-06-21 MED ORDER — SODIUM CHLORIDE 0.9 % IV SOLN
40.0000 mg | Freq: Once | INTRAVENOUS | Status: AC
  Administered 2020-06-21: 40 mg via INTRAVENOUS
  Filled 2020-06-21: qty 4

## 2020-06-21 MED ORDER — PALONOSETRON HCL INJECTION 0.25 MG/5ML
INTRAVENOUS | Status: AC
  Filled 2020-06-21: qty 5

## 2020-06-21 MED ORDER — SODIUM CHLORIDE 0.9 % IV SOLN
Freq: Once | INTRAVENOUS | Status: AC
  Filled 2020-06-21: qty 250

## 2020-06-21 MED ORDER — PALONOSETRON HCL INJECTION 0.25 MG/5ML
0.2500 mg | Freq: Once | INTRAVENOUS | Status: AC
  Administered 2020-06-21: 0.25 mg via INTRAVENOUS

## 2020-06-21 MED ORDER — TRAMADOL HCL 50 MG PO TABS: 50.0000 mg | ORAL_TABLET | Freq: Four times a day (QID) | ORAL | 0 refills | Status: DC | PRN

## 2020-06-21 MED ORDER — SODIUM CHLORIDE 0.9 % IV SOLN
225.0000 mg/m2 | Freq: Once | INTRAVENOUS | Status: AC
  Administered 2020-06-21: 340 mg via INTRAVENOUS
  Filled 2020-06-21: qty 17

## 2020-06-21 MED ORDER — BORTEZOMIB CHEMO SQ INJECTION 3.5 MG (2.5MG/ML)
1.5000 mg/m2 | Freq: Once | INTRAMUSCULAR | Status: AC
  Administered 2020-06-21: 2.25 mg via SUBCUTANEOUS
  Filled 2020-06-21: qty 0.9

## 2020-06-21 NOTE — Progress Notes (Signed)
Maintain doses as previous per Dr Lorenso Courier  T.O. Dr Genia Hotter, PharmD

## 2020-06-21 NOTE — Progress Notes (Signed)
Per Dr. Lorenso Courier, [facility] to treat with labs from today.

## 2020-06-21 NOTE — Patient Instructions (Addendum)
[facility] Discharge Instructions for Patients Receiving Chemotherapy  Today you received the following chemotherapy agents: bortezomib and cyclophosphamide.  To help prevent nausea and vomiting after your treatment, we encourage you to take your nausea medication as directed.   If you develop nausea and vomiting that is not controlled by your nausea medication, call the clinic.   BELOW ARE SYMPTOMS THAT SHOULD BE REPORTED IMMEDIATELY:  *FEVER GREATER THAN 100.5 F  *CHILLS WITH OR WITHOUT FEVER  NAUSEA AND VOMITING THAT IS NOT CONTROLLED WITH YOUR NAUSEA MEDICATION  *UNUSUAL SHORTNESS OF BREATH  *UNUSUAL BRUISING OR BLEEDING  TENDERNESS IN MOUTH AND THROAT WITH OR WITHOUT PRESENCE OF ULCERS  *URINARY PROBLEMS  *BOWEL PROBLEMS  UNUSUAL RASH Items with * indicate a potential emergency and should be followed up as soon as possible.  Feel free to call the clinic should you have any questions or concerns. The clinic phone number is [phone].  Please show the [place] at check-in to the Emergency Department and triage nurse.   Implanted [facility] Guide An implanted port is a device that is placed under the skin. It is usually placed in the chest. The device can be used to give IV medicine, to take blood, or for dialysis. You may have an implanted port if:  You need IV medicine that would be irritating to the small veins in your hands or arms.  You need IV medicines, such as antibiotics, for a long period of time.  You need IV nutrition for a long period of time.  You need dialysis. Having a port means that your health care provider will not need to use the veins in your arms for these procedures. You may have fewer limitations when using a port than you would if you used other types of long-term IVs, and you will likely be able to return to normal activities after your incision heals. An implanted port has two main parts:  Reservoir. The  reservoir is the part where a needle is inserted to give medicines or draw blood. The reservoir is round. After it is placed, it appears as a small, raised area under your skin.  Catheter. The catheter is a thin, flexible tube that connects the reservoir to a vein. Medicine that is inserted into the reservoir goes into the catheter and then into the vein. How is my port accessed? To access your port:  A numbing cream may be placed on the skin over the port site.  Your health care provider will put on a mask and sterile gloves.  The skin over your port will be cleaned carefully with a germ-killing soap and allowed to dry.  Your health care provider will gently pinch the port and insert a needle into it.  Your health care provider will check for a blood return to make sure the port is in the vein and is not clogged.  If your port needs to remain accessed to get medicine continuously (constant infusion), your health care provider will place a clear bandage (dressing) over the needle site. The dressing and needle will need to be changed every week, or as told by your health care provider. What is flushing? Flushing helps keep the port from getting clogged. Follow instructions from your health care provider about how and when to flush the port. Ports are usually flushed with saline solution or a medicine called heparin. The need for flushing will depend on how the port is used:  If the port  is only used from time to time to give medicines or draw blood, the port may need to be flushed: ? Before and after medicines have been given. ? Before and after blood has been drawn. ? As part of routine maintenance. Flushing may be recommended every 4-6 weeks.  If a constant infusion is running, the port may not need to be flushed.  Throw away any syringes in a disposal container that is meant for sharp items (sharps container). You can buy a sharps container from a pharmacy, or you can make one by using  an empty hard plastic bottle with a cover. How long will my port stay implanted? The port can stay in for as long as your health care provider thinks it is needed. When it is time for the port to come out, a surgery will be done to remove it. The surgery will be similar to the procedure that was done to put the port in. Follow these instructions at home:   Flush your port as told by your health care provider.  If you need an infusion over several days, follow instructions from your health care provider about how to take care of your port site. Make sure you: ? Wash your hands with soap and water before you change your dressing. If soap and water are not available, use alcohol-based hand sanitizer. ? Change your dressing as told by your health care provider. ? Place any used dressings or infusion bags into a plastic bag. Throw that bag in the trash. ? Keep the dressing that covers the needle clean and dry. Do not get it wet. ? Do not use scissors or sharp objects near the tube. ? Keep the tube clamped, unless it is being used.  Check your port site every day for signs of infection. Check for: ? Redness, swelling, or pain. ? Fluid or blood. ? Pus or a bad smell.  Protect the skin around the port site. ? Avoid wearing bra straps that rub or irritate the site. ? Protect the skin around your port from seat belts. Place a soft pad over your chest if needed.  Bathe or shower as told by your health care provider. The site may get wet as long as you are not actively receiving an infusion.  Return to your normal activities as told by your health care provider. Ask your health care provider what activities are safe for you.  Carry a medical alert card or wear a medical alert bracelet at all times. This will let health care providers know that you have an implanted port in case of an emergency. Get help right away if:  You have redness, swelling, or pain at the port site.  You have fluid or  blood coming from your port site.  You have pus or a bad smell coming from the port site.  You have a fever. Summary  Implanted ports are usually placed in the chest for long-term IV access.  Follow instructions from your health care provider about flushing the port and changing bandages (dressings).  Take care of the area around your port by avoiding clothing that puts pressure on the area, and by watching for signs of infection.  Protect the skin around your port from seat belts. Place a soft pad over your chest if needed.  Get help right away if you have a fever or you have redness, swelling, pain, drainage, or a bad smell at the port site. This information is not intended to replace  advice given to you by your health care provider. Make sure you discuss any questions you have with your health care provider. Document Revised: 12/11/2018 Document Reviewed: 09/21/2016 Elsevier Patient Education  2020 [employer].

## 2020-06-21 NOTE — Progress Notes (Signed)
Nutrition Assessment   Reason for Assessment:  Patient identified on Malnutrition Screening report for weight loss and poor appetite   ASSESSMENT:  79 year old female with likely amyloidosis with resulting heart failure.  Patient on chemotherapy.  Past medical history of CHF, CKD stage III, prediabetes, HLD, stroke, renal mass.  Spoke with grand-daughter Thu via phone as infusion time changed for today.  Patient was present during phone visit.  Thu reports poor appetite. Diarrhea has improved, last diarrhea stool about 1 week ago.  Thu reports patient is picky eater and like traditional vietnamese foods.  Has been eating rice and spam for breakfast.  Drinks ensure powder mixed with water, warmed.  Lunch is rice and some protein food. Evening meal is mainly snacks and more ensure.  Denies nausea.     Medications: reviewed   Labs: reviewed   Anthropometrics:   Height: 60 inches Weight: 122 lb 9/29 130 lb 8/25 118 lb BMI: 23  Noted per MD note weight loss from fluid loss from lasix and diarrhea.    6% weight loss in the last month   NUTRITION DIAGNOSIS: Unintentional weight loss related to cancer related treatment side effects, fluid loss as evidenced by 6% weight loss in the last month and poor appetite.   INTERVENTION:  Discussed option of mixing powdered ensure with milk (cow or soy) for added calories and protein.  Patient wants ensure warm. Can also buy premade ensure plus and pour in microwave safe glass and warm.  Encouraged 2-3 times per day. Encouraged small frequent meals Encouraged protein food at every meal.  Patient mainly eats fish for protein food. Does not like beans/lentils, other meats, eggs.   Contact information provided   MONITORING, EVALUATION, GOAL: weight trends, intake   Next Visit: Nov 24 during infusion  Lidya Mccalister B. Zenia Resides, [place], [place] Registered Dietitian [phone] (mobile)      .

## 2020-06-23 ENCOUNTER — Telehealth: Payer: Self-pay | Admitting: *Deleted

## 2020-06-23 NOTE — Progress Notes (Signed)
Carelink Summary Report / Loop Recorder 

## 2020-06-23 NOTE — Telephone Encounter (Signed)
Received call from lab. Pt has apparently turned in 24 hour Urine sample but there was only about 43mls of urine in the jug. Mikayla Reynolds asking what they should do. Advised to discard and will inform Dr. Lorenso Courier to have re-ordered next visit.

## 2020-06-28 ENCOUNTER — Other Ambulatory Visit: Payer: Self-pay

## 2020-06-28 ENCOUNTER — Inpatient Hospital Stay: Payer: Medicaid Other

## 2020-06-28 ENCOUNTER — Inpatient Hospital Stay (HOSPITAL_BASED_OUTPATIENT_CLINIC_OR_DEPARTMENT_OTHER): Payer: Medicaid Other | Admitting: Hematology and Oncology

## 2020-06-28 ENCOUNTER — Encounter: Payer: Self-pay | Admitting: Hematology and Oncology

## 2020-06-28 ENCOUNTER — Other Ambulatory Visit: Payer: Self-pay | Admitting: *Deleted

## 2020-06-28 VITALS — BP 101/64 | HR 64 | Temp 97.8°F | Resp 18 | Ht 60.0 in | Wt 120.7 lb

## 2020-06-28 DIAGNOSIS — N1831 Chronic kidney disease, stage 3a: Secondary | ICD-10-CM | POA: Diagnosis not present

## 2020-06-28 DIAGNOSIS — D472 Monoclonal gammopathy: Secondary | ICD-10-CM

## 2020-06-28 DIAGNOSIS — I43 Cardiomyopathy in diseases classified elsewhere: Secondary | ICD-10-CM

## 2020-06-28 DIAGNOSIS — Z5112 Encounter for antineoplastic immunotherapy: Secondary | ICD-10-CM | POA: Diagnosis not present

## 2020-06-28 DIAGNOSIS — E854 Organ-limited amyloidosis: Secondary | ICD-10-CM | POA: Diagnosis not present

## 2020-06-28 LAB — CMP (CANCER CENTER ONLY)
ALT: 24 U/L (ref 0–44)
AST: 42 U/L — ABNORMAL HIGH (ref 15–41)
Albumin: 3 g/dL — ABNORMAL LOW (ref 3.5–5.0)
Alkaline Phosphatase: 277 U/L — ABNORMAL HIGH (ref 38–126)
Anion gap: 9 (ref 5–15)
BUN: 73 mg/dL — ABNORMAL HIGH (ref 8–23)
CO2: 18 mmol/L — ABNORMAL LOW (ref 22–32)
Calcium: 8.3 mg/dL — ABNORMAL LOW (ref 8.9–10.3)
Chloride: 110 mmol/L (ref 98–111)
Creatinine: 2.52 mg/dL — ABNORMAL HIGH (ref 0.44–1.00)
GFR, Estimated: 19 mL/min — ABNORMAL LOW (ref >60)
Glucose, Bld: 134 mg/dL — ABNORMAL HIGH (ref 70–99)
Potassium: 3.2 mmol/L — ABNORMAL LOW (ref 3.5–5.1)
Sodium: 137 mmol/L (ref 135–145)
Total Bilirubin: 2.8 mg/dL — ABNORMAL HIGH (ref 0.3–1.2)
Total Protein: 5.8 g/dL — ABNORMAL LOW (ref 6.5–8.1)

## 2020-06-28 LAB — CBC WITH DIFFERENTIAL (CANCER CENTER ONLY)
Abs Immature Granulocytes: 0.12 10*3/uL — ABNORMAL HIGH (ref 0.00–0.07)
Basophils Absolute: 0 10*3/uL (ref 0.0–0.1)
Basophils Relative: 2 %
Eosinophils Absolute: 0 10*3/uL (ref 0.0–0.5)
Eosinophils Relative: 2 %
HCT: 26.2 % — ABNORMAL LOW (ref 36.0–46.0)
Hemoglobin: 9.5 g/dL — ABNORMAL LOW (ref 12.0–15.0)
Immature Granulocytes: 6 %
Lymphocytes Relative: 9 %
Lymphs Abs: 0.2 10*3/uL — ABNORMAL LOW (ref 0.7–4.0)
MCH: 34.7 pg — ABNORMAL HIGH (ref 26.0–34.0)
MCHC: 36.3 g/dL — ABNORMAL HIGH (ref 30.0–36.0)
MCV: 95.6 fL (ref 80.0–100.0)
Monocytes Absolute: 0.5 10*3/uL (ref 0.1–1.0)
Monocytes Relative: 24 %
Neutro Abs: 1.2 10*3/uL — ABNORMAL LOW (ref 1.7–7.7)
Neutrophils Relative %: 57 %
Platelet Count: 65 10*3/uL — ABNORMAL LOW (ref 150–400)
RBC: 2.74 MIL/uL — ABNORMAL LOW (ref 3.87–5.11)
RDW: 14.6 % (ref 11.5–15.5)
WBC Count: 2 10*3/uL — ABNORMAL LOW (ref 4.0–10.5)
nRBC: 1.5 % — ABNORMAL HIGH (ref 0.0–0.2)

## 2020-06-28 MED ORDER — SODIUM CHLORIDE 0.9 % IV SOLN
Freq: Once | INTRAVENOUS | Status: AC
  Filled 2020-06-28: qty 250

## 2020-06-28 NOTE — Progress Notes (Signed)
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 Ordered during evaluation for cardiomyopathy.  2) 03/13/2020: establish care with Dr. 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   Interval History:  Tanaysha Alkins 79 y.o. 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 The patient's last visit was on 06/14/2020. In the interim since the last visit she has continued with Cycle 2 of CyBorD chemotherapy.   On exam today communication with the patient is assisted by an approved hospital [place] interpreter. Mrs. Hinchcliff is accompanied by her son.  She reports that she has been having difficulty since her last treatment.  She has been having diarrhea "many times per day" for about 2 days after treatment.  She has been taking loperamide but unfortunately this has not provided any relief.  She is become very weak and reports that she cannot walk much.  Fortunately the pain in her back which was present during her last chemotherapy revisit has  subsequently resolved.  She reports that she is also urinating considerably less frequently.  Her son reports that she has occasionally become disoriented and that she is pale and occasionally shakes.  She currently denies having any issues with fevers, chills, sweats, nausea, or vomiting.  A full 10 point ROS is listed below.  MEDICAL HISTORY:  Past Medical History:  Diagnosis Date   Abnormal liver function    Anemia, unspecified    Chronic combined systolic and diastolic CHF (congestive heart failure) (HCC)    CKD (chronic kidney disease), stage III ([place]) 02/19/2018   Elevated troponin 12/27/2017   Hyperlipidemia    Junctional bradycardia    Mitral regurgitation    Pre-diabetes    Renal mass    Stroke [facility])     SURGICAL HISTORY: Past Surgical History:  Procedure Laterality Date   abdominal wall surgery  2017   APPENDECTOMY     LOOP RECORDER INSERTION N/A 06/23/2018   Procedure: LOOP RECORDER INSERTION;  Surgeon: [name], MD;  Location: [facility] [facility];  Service: Cardiovascular;  Laterality: N/A;   TEE WITHOUT CARDIOVERSION N/A 06/23/2018   Procedure: TRANSESOPHAGEAL ECHOCARDIOGRAM (TEE);  Surgeon: [name], MD;  Location: [facility] ENDOSCOPY;  Service: Cardiovascular;  Laterality: N/A;    SOCIAL HISTORY: Social History   Socioeconomic History   Marital status: Widowed    Spouse name: Not on file   Number of children: Not on file   Years of education: Not on file   Highest education level: Not on file  Occupational History   Not on file  Tobacco Use   Smoking status: Never Smoker   Smokeless tobacco: Never Used  Vaping Use   Vaping Use: Never used  Substance and Sexual Activity  Alcohol use: Not Currently   Drug use: Not Currently   Sexual activity: Not Currently  Other Topics Concern   Not on file  Social History Narrative   Not on file   Social Determinants of Health   Financial Resource Strain:    Difficulty of Paying  Living Expenses: Not on file  Food Insecurity:    Worried About [place] in the Last Year: Not on file   Ran Out of Food in the Last Year: Not on file  Transportation Needs:    Lack of Transportation (Medical): Not on file   Lack of Transportation (Non-Medical): Not on file  Physical Activity:    Days of Exercise per Week: Not on file   Minutes of Exercise per Session: Not on file  Stress:    Feeling of Stress : Not on file  Social Connections:    Frequency of Communication with Friends and Family: Not on file   Frequency of Social Gatherings with Friends and Family: Not on file   Attends Religious Services: Not on file   Active Member of Clubs or Organizations: Not on file   Attends Archivist Meetings: Not on file   Marital Status: Not on file  Intimate Partner Violence:    Fear of Current or Ex-Partner: Not on file   Emotionally Abused: Not on file   Physically Abused: Not on file   Sexually Abused: Not on file    FAMILY HISTORY: Family History  Problem Relation Age of Onset   Healthy Sister    Healthy Brother     ALLERGIES:  has No Known Allergies.  MEDICATIONS:  Current Outpatient Medications  Medication Sig Dispense Refill   acyclovir (ZOVIRAX) 400 MG tablet Take 1 tablet (400 mg total) by mouth daily. 30 tablet 3   aspirin EC 81 MG tablet Take 1 tablet (81 mg total) by mouth daily. Swallow whole. 90 tablet 3   atorvastatin (LIPITOR) 40 MG tablet Take 1 tablet (40 mg total) by mouth daily at 6 PM. 90 tablet 3   furosemide (LASIX) 20 MG tablet Take 1 tablet (20 mg total) by mouth every other day. 90 tablet 3   loperamide (IMODIUM) 2 MG capsule Take 1 capsule (2 mg total) by mouth as needed for diarrhea or loose stools. (Patient not taking: Reported on 06/15/2020) 60 capsule 1   Methylcellulose, Laxative, (FIBER THERAPY) 500 MG TABS Take by mouth daily.     midodrine (PROAMATINE) 5 MG tablet Take 1 tablet (5 mg total) by  mouth 3 (three) times daily with meals. 90 tablet 6   Polyethylene Glycol 3350 (MIRALAX PO) Take by mouth.     potassium chloride SA (KLOR-CON) 20 MEQ tablet Take 2 tablets by mouth for 2 days then hold 30 tablet 0   spironolactone (ALDACTONE) 25 MG tablet Take 0.5 tablets (12.5 mg total) by mouth daily. 45 tablet 3   traMADol (ULTRAM) 50 MG tablet Take 1 tablet (50 mg total) by mouth every 6 (six) hours as needed. 30 tablet 0   No current facility-administered medications for this visit.   Facility-Administered Medications Ordered in Other Visits  Medication Dose Route Frequency Provider Last Rate Last Admin   0.9 %  sodium chloride infusion   Intravenous Once Orson Slick, MD 500 mL/hr at 06/28/20 0928 New Bag at 06/28/20 0928    REVIEW OF SYSTEMS:   Constitutional: ( - ) fevers, ( - )  chills , ( - ) night sweats  Eyes: ( - ) blurriness of vision, ( - ) double vision, ( - ) watery eyes Ears, nose, mouth, throat, and face: ( - ) mucositis, ( - ) sore throat Respiratory: ( - ) cough, ( - ) dyspnea, ( - ) wheezes Cardiovascular: ( - ) palpitation, ( - ) chest discomfort, ( - ) lower extremity swelling Gastrointestinal:  ( - ) nausea, ( - ) heartburn, ( - ) change in bowel habits Skin: ( - ) abnormal skin rashes Lymphatics: ( - ) new lymphadenopathy, ( - ) easy bruising Neurological: ( - ) numbness, ( - ) tingling, ( - ) new weaknesses Behavioral/Psych: ( - ) mood change, ( - ) new changes  All other systems were reviewed with the patient and are negative.  PHYSICAL EXAMINATION: ECOG PERFORMANCE STATUS: 1 - Symptomatic but completely ambulatory  Vitals:   06/28/20 0816  BP: 101/64  Pulse: 64  Resp: 18  Temp: 97.8 F (36.6 C)  SpO2: 100%   Filed Weights   06/28/20 0816  Weight: 120 lb 11.2 oz (54.7 kg)    GENERAL: well appearing elderly [place] female. alert, no distress and comfortable SKIN: skin color, texture, turgor are normal, no rashes or significant  lesions EYES: conjunctiva are pink and non-injected, sclera clear LUNGS: clear to auscultation and percussion with normal breathing effort HEART: regular rate & rhythm and no murmurs and no lower extremity edema Musculoskeletal: no cyanosis of digits and no clubbing  PSYCH: alert & oriented x 3, fluent speech NEURO: no focal motor/sensory deficits  LABORATORY DATA:  I have reviewed the data as listed CBC Latest Ref Rng & Units 06/28/2020 06/21/2020 06/14/2020  WBC 4.0 - 10.5 K/uL 2.0(L) 5.6 2.6(L)  Hemoglobin 12.0 - 15.0 g/dL 9.5(L) 10.1(L) 11.0(L)  Hematocrit 36 - 46 % 26.2(L) 27.9(L) 31.5(L)  Platelets 150 - 400 K/uL 65(L) 94(L) 99(L)    CMP Latest Ref Rng & Units 06/28/2020 06/21/2020 06/14/2020  Glucose 70 - 99 mg/dL 134(H) 111(H) 91  BUN 8 - 23 mg/dL 73(H) 41(H) 29(H)  Creatinine 0.44 - 1.00 mg/dL 2.52(H) 1.96(H) 1.35(H)  Sodium 135 - 145 mmol/L 137 131(L) 135  Potassium 3.5 - 5.1 mmol/L 3.2(L) 3.8 3.8  Chloride 98 - 111 mmol/L 110 106 106  CO2 22 - 32 mmol/L 18(L) 17(L) 22  Calcium 8.9 - 10.3 mg/dL 8.3(L) 8.6(L) 9.2  Total Protein 6.5 - 8.1 g/dL 5.8(L) 6.4(L) 7.2  Total Bilirubin 0.3 - 1.2 mg/dL 2.8(H) 2.7(H) 2.8(H)  Alkaline Phos 38 - 126 U/L 277(H) 333(H) 302(H)  AST 15 - 41 U/L 42(H) 48(H) 50(H)  ALT 0 - 44 U/L 24 31 36    Lab Results  Component Value Date   MPROTEIN 1.1 (H) 06/14/2020   MPROTEIN 2.7 (H) 03/13/2020   MPROTEIN 2.4 (H) 03/01/2020   Lab Results  Component Value Date   KPAFRELGTCHN 18.5 06/14/2020   KPAFRELGTCHN 23.8 (H) 03/13/2020   LAMBDASER 16.9 06/14/2020   LAMBDASER 34.5 (H) 03/13/2020   KAPLAMBRATIO 1.09 06/14/2020   KAPLAMBRATIO 7.27 03/23/2020   KAPLAMBRATIO 0.69 03/13/2020     PATHOLOGY: Surgical Pathology  CASE: [id number]  PATIENT: Kennetta Gassert  Bone Marrow Report   Clinical History: None provided   DIAGNOSIS:   BONE MARROW, ASPIRATE, CLOT, CORE:  - Cellular marrow involved by a plasma cell neoplasm (20%)  - See  comment and microscopic description below   PERIPHERAL BLOOD:  - Pancytopenia  - See complete blood cell count   COMMENT:   The bone  marrow biopsy is involved by a plasma cell neoplasm, 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 Correlation with radiographic findings  and laboratory data is required to characterize this plasma cell  neoplasm. There was no amyloid detected on the core biopsy or clot  section; however, the sample was suboptimal for evaluation.   MICROSCOPIC DESCRIPTION:   PERIPHERAL BLOOD SMEAR: The peripheral blood has a macrocytic anemia,  leukopenia and thrombocytopenia. Red cell morphology shows mild  anisocytosis. There is no rouleaux formation. Leukocytes are  morphologically unremarkable. Circulating plasma cellsare not  identified.   BONE MARROW ASPIRATE: Spicular, cellular and adequate for evaluation  Erythroid precursors: Orderly maturation without overt dysplasia  Granulocytic precursors: Orderly maturation without overt dysplasia  Megakaryocytes: Qualitatively and quantitatively unremarkable  Lymphocytes/plasma cells: There are increased plasma cells (16% by  manual differential counts) some are enlarged with multi nucleated forms  present. There is no lymphocytosis.   TOUCH PREPARATIONS: Markedly hypocellular with no additional findings as  compared to the aspirate smear   CLOT AND BIOPSY: Examination of the bone marrow core biopsy and clot  section reveals a variably cellular marrow (30%) with trilineage  hematopoiesis. CD138 highlights clusters of plasma cells (approximately  20% of the total marrow cellularity) which are lambda restricted by  kappa and lambda in situ hybridization. There is a mixed population of  B and T cells by CD20 and CD3 immunohistochemistry respectively. [place]  red performed on the core biopsy and clot section is NEGATIVE.   IRON STAIN: Iron stains are performed on a bone marrow aspirate or touch  imprint smear  and section of clot. The controls stained appropriately.     Storage Iron: Present    Ring Sideroblasts: Not identified   ADDITIONAL DATA/TESTING: Cytogenetics is pending and will be reported  separately.   CELL COUNT DATA:   Bone Marrow count performed on 500 cells shows:  Blasts:  0%  Myeloid: 44%  Promyelocytes: 0%  Erythroid:   28%  Myelocytes:  7%  Lymphocytes:  7%  Metamyelocytes:   0%  Plasma cells: 16%  Bands:  8%  Neutrophils:  23% M:E ratio:   1.57  Eosinophils:  5%  Basophils:   1%  Monocytes:   5%   Lab Data: CBC performed on 03/23/2020 shows:  WBC: 3.5 k/uL Neutrophils:  59%  Hgb: 11.0 g/dL Lymphocytes:  94%  HCT: 33.1 %  Monocytes:   14%  MCV: 105.4 fL Eosinophils:  6%  RDW: 14.6 %  Basophils:   1%  PLT: 139 k/uL   GROSS DESCRIPTION:   A. Bone marrow aspirate smear.   B. Received in B-plus fixative is a 1.2 x 1 x 0.1 cm aggregate of  tan-brown tissue, submitted in 1 cassette.   C. Received in B-plus fixative is a 1.2 cm in length by 0.2 cm diameter  hard tan-brown soft tissue core, submitted in 1 cassette following  decalcification in Immunocal. (AK 03/23/2020)%    Final Diagnosis performed by Manning Charity, MD.  Electronically signed  03/28/2020    RADIOGRAPHIC STUDIES: I have personally reviewed the radiological images as listed and agreed with the findings in the report: no evidence of lytic lesions on bone scan.  DG Thoracic Spine 2 View  Result Date: 06/22/2020 CLINICAL DATA:  New onset back pain.  Monoclonal gammopathy. EXAM: THORACIC SPINE 2 VIEWS COMPARISON:  None. FINDINGS: Diffuse bone demineralization. Normal alignment of the thoracic spine. No vertebral compression deformities. Degenerative changes with narrowed interspaces and endplate hypertrophic change. No  focal bone lesion or bone destruction. No abnormal paraspinal soft tissue thickening. Calcification of the aorta. Loop recorder. IMPRESSION:  Diffuse bone demineralization. No acute displaced fractures identified. Electronically Signed   By: Lucienne Capers M.D.   On: 06/22/2020 22:42   DG Lumbar Spine 2-3 Views  Result Date: 06/22/2020 CLINICAL DATA:  New onset back pain, history of monoclonal gammopathy EXAM: LUMBAR SPINE - 2-3 VIEW COMPARISON:  None. FINDINGS: Five lumbar type vertebral bodies are well visualized. Vertebral body height is well maintained. No soft tissue abnormality is seen. Mild anterolisthesis of L5 on S1 is noted which appears to be degenerative in nature. IMPRESSION: Mild degenerative change without acute abnormality. Electronically Signed   By: Inez Catalina M.D.   On: 06/22/2020 22:42   CUP PACEART REMOTE DEVICE CHECK  Result Date: 06/10/2020 ILR summary report received. Battery status OK. Normal device function. No new symptom episodes, tachy episodes, brady, or pause episodes. 1 AF episode, false. Monthly summary reports and ROV/PRN   ASSESSMENT & PLAN Mikayla Reynolds 79 y.o. female with medical history significant for CHF, CKD, HLD, CVA, and DM type II who presents for evaluation of a monoclonal gammopathy. After review of the labs, discussion with the patient, and reviewed the prior records the patient's findings are concerning for a plasma cell dyscrasia and possible amyloidosis.The patient has cardiac dysfunction with an equivocal myocardial amyloid imaging study performed on 02/28/2020 and a bone marrow biopsy on 03/23/2020 which showed 20% plasma cell population (but no evidence of amyloidosis). Cardiac MRI on 04/19/2020 is highly concerning for amyloid infiltrate. Based on this, we can presumptively start treatment while we await confirmation of amyloid with a tissue sample.   On exam today Mrs. Carraway is accompanied by her son.  She is considerably weaker than prior and is having diarrhea.  Her labs do show an increase in creatinine as well as other signs and symptoms of dehydration.  I would recommend holding  the chemotherapy at this time and proceeding with a small 500 cc bolus of fluid in order to try to help improve her hydration status.  Additionally I recommend that she hold her diuretics for 2 days in order to help build up her fluid status.  We will have her return to get labs next week followed by a return clinic visit in 2 weeks for consideration of restarting chemotherapy.  In order to confirm the diagnosis of amyloidosis we will need to obtain further tissue samples. Unfortunately the fat pad biopsy is negative, we need to consider biopsy of the liver versus cardiac biopsy in order to help confirm the diagnosis.  Based on her plasma cells and imaging there is strong supporting evidence to start treatment now while we await these biopsy results.  We require the amyloid protein in order to effectively confirm the diagnosis.   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  As such the treatment of choice would be to target her plasma cell population with a triplet or quadruplet therapy.  Therapy of choice in this case would consist of daratumumab, Velcade, cyclophosphamide, and dexamethasone.  Given the patient's advanced age I would recommend that we start initially with the Velcade, cyclophosphamide, and Dex and then add the daratumumab on if she was shown to tolerate the treatment.  I discussed the side effects of this chemotherapy with the patient including neuropathy, elevated blood pressure, drop in blood counts, hypersensitivity reaction, chest tightness, increased infection risk, and fatigue.  The patient voiced her understanding of these findings and is agreeable to move forward with triple therapy with the intention of advancing to quadruple therapy if well tolerated.  The regimen of choice will be daratumumab, bortezomib, cyclophosphamide and dexamethasone per the ANDROMEDA Study ( Blood. 2020 Jul 2;136(1):71-80). We will modify this to start  with CyBorD with the addition of daratumumab if she is shown to tolerate initial therapy.   Treatment consists of: Cyclophosphamide 300 mg/m2 intravenously and bortezomib 1.3 mg/m2 subcutaneously were given on days 1, 8, 15, and 22 of each 28 day cycle for up to 6 cycles. Dexamethasone 40 mg (starting dose) was given orally or intravenously weekly for each cycle for up to 6 cycles. DARA Peetz was administered in a single, premixed vial and given by manual  injection over the course of 3 to 5 minutes weekly in cycles 1 to 2, every 2 weeks in cycles 3 to 6, and every 4 weeks thereafter as monotherapy for a maximum of 2 years.  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 In order to confirm the diagnosis. In order to confirm diagnosis the next best step would be a biopsy of cardiac tissue. This may not be feasible moving forward.  --given the ample evidence of an AL amyloidosis will start treatment while awaiting final pathological diagnosis. Further delay may result in worsening heart function/decline.  --The patient's elevations in LFTs may also indicate amyloid involvement of the liver (or more likely congestive hepatopathy).  --monoclonal gammopathy reveals no CRAB criteria. 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  -- given the patient's condition she is a good candidate for systemic treatment --recommend HOLDING chemotherapy on 06/28/2020 due to worsening kidney function and deconditioning. --RTC in 2 weeks, for consideration of continued CyBorD. Recommend lab visit on Tuesday of next week to assure improving kidney function.   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  --counts are stable on exam today --continue to monitor   No orders of the defined types were placed in this encounter.  All questions were answered. The patient knows to call the clinic with any problems, questions or concerns.  A total of more than 30 minutes were spent on this encounter and over half of that time was spent on counseling and coordination of care as outlined above.   Ledell Peoples, MD Department of Hematology/Oncology [place] at [facility] Phone: [phone] Pager: [phone] Email: [email or web address]  06/28/2020 9:46 AM

## 2020-06-28 NOTE — Patient Instructions (Signed)
M?t n??c, Ng??i cao tu?i Dehydration, Elderly M?t n??c l ti?nh tra?ng khng c ?? n??c ho?c d?ch khc trong c? th?. Tnh tr?ng ny x?y ra khi m?t ng??i m?t nhi?u n??c h?n l??ng n??c ng??i ? u?ng vo. Ca?c c? Georges Lynch tr?ng nh? th?n, no v tim khng th? th?c hi?n ch?c n?ng m khng c m?t l???ng d?ch phu? h??p. B?t c? lo?i d?ch no b? m?t kho?i c? th? ??u c th? d?n ??n m?t n??c. Nh?ng ng??i t? 66 tu?i tr? ln c nguy c? b? m?t n??c cao h?n so v?i nh?ng ng??i tr??ng thnh tr? tu?i. Nguyn nhn l ? ng??i l?n tu?i, c? th?:  t c kh? n?ng duy tr l??ng n??c ph h?p.  C?ng khng c ?p ?ng v?i nh?ng thay ??i nhi?t ??Maggie Schwalbe c c?m gic kht d? dng ho?c nhanh chng. M?t n??c c th? l nh?, v?a ph?i, ho?c n?ng. Tnh tr?ng ny c?n ???c ?i?u tr? ngay l?p t?c ?? ng?n khng cho m?t n??c tr? nn n?ng h?n. Nguyn nhn g gy ra? M?t n??c c th? l do:  Nh?ng tnh tr?ng gy m?t n??c ho?c d?ch khc, ch?ng h?n nh? tiu ch?y, nn m?a, ho?c ?? m? hi ho?c ?i ti?u nhi?u.  Khng u?ng ?? n??c, ??c bi?t l khi qu v? b? ?m ho?c th?c hi?n cc ho?t ??ng c?n nhi?u n?ng l??ng.  Cc b?nh v tnh tr?ng khc, ch?ng h?n nh? s?t ho?c nhi?m trng.  M?t s? lo?i thu?c nh?t ??nh, ch?ng h?n nh? cc lo?i thu?c khi?n c? th? m?t n??c qu m?c (thu?c l?i ti?u).  Thi?u n??c u?ng an ton.  Khng th? c ?? n??c v th?c ph?m. ?i?u g lm t?ng nguy c?? Tnh tr?ng ny r?t c kh? n?ng x?y ra ? nh?ng ng??i:  C b?nh di h?n (m?n tnh), ch?ng h?n: ? B?nh c th? khi?n qu v? ?i ti?u nhi?u h?n, ch?ng h?n nh? ti?u ???ng. ? B?nh th?n, tim, ho?c ph?i ch?a ???c ?i?u tr? ?ng cch. ? B?nh v? no v h? th?n kinh ho?c tnh tr?ng b?t th??ng ?nh h??ng ??n suy ngh? v c?m xc c?a qu v?, ch?ng h?n nh? sa st tr tu?.  T? 66 tu?i tr? ln.  B? tn t?t.  S?ng ? m?t n?i c ?? cao l?n, ? ? khng kh long, kh h?n lm qu v? m?t n??c nhi?u h?n. C cc d?u hi?u ho?c tri?u ch?ng g? Cc tri?u ch?ng m?t n??c ph? thu?c vo m?c ?? n?ng c?a  m?t n??c. M?t n??c nh? ho?c v?a ph?i  Kha?t.  Mi ho??c mi?ng kh.  Chng m?t ho?c chong vng, ??c bi?t l khi ?ang ng?i th ??ng ln.  Co th?t c?.  N??c ti?u ??m mu. N??c ti?u c th? c mu tr.  L??ng n??c ti?u ho?c n??c m?t t h?n bnh th??ng.  ?au ??u. M?t n??c n?ng  Nh?ng thay ??i ?? da. Da qu v? c th? l?nh v ?m, c v?t loang l?, ho?c nh?t nh?t. Da Sander Nephew v? c?ng c th? khng tr? v? bnh th??ng sau khi b? vo nh? v th? ra.  C t ho?c khng c n??c m?t, n??c ti?u ho?c m? hi.  Cc thay ??i v? cc d?u hi?u sinh t?n, ch?ng h?n nh? th? nhanh v huy?t p th?p. M?ch c?a qu v? c th? y?u ho?c c th? nhanh h?n 100 nh?p/pht khi qu v? ng?i yn.  Nh?ng thay ??i khc, ch?ng h?n nh?: ? C?m th?y r?t kht. ? M?t tr?ng. ? Bn  tay v bn chn l?nh. ? L l?n. ? R?t m?t m?i (ng? l?m) ho?c kh th?c gi?c. ? S?t cn trong th?i gian ng??n. ? M?t  th?c. Ch?n ?on tnh tr?ng ny nh? th? no? Tnh tr?ng ny ???c ch?n ?on d?a vo cc tri?u ch?ng c?a qu v? v khm th?c th?. Xt nghi?m mu v n??c ti?u c th? gip kh?ng ??nh ch?n ?on. Tnh tr?ng ny ???c ?i?u tr? nh? th? no? ?i?u tr? tnh tr?ng ny ty thu?c vo m?c ?? n?ng c?a m?t n??c. ?i?u tr? c?n ph?i ???c b?t ??u ngay l?p t?c. Khng ch? cho ??n khi m?t n??c tr? nn n?ng h?n. M?t n??c n?ng l tnh tr?ng c?p c?u v c?n ???c ?i?u tr? t?i b?nh vi?n.  C th? ?i?u tr? m?t n??c nh? ho?c v?a ph?i t?i nh. Qu v? c th? ???c yu c?u: ? U?ng nhi?u n??c h?n. ? U?ng dung d?ch b n??c (ORS). Lo?i ?? u?ng ny gip khi ph?c l?i l??ng n??c, mu?i v cc khong ch?t ph h?p trong mu (cc ch?t ?i?n gi?i).  M?t n??c n?ng c th? ???c ?i?u tr?: ? B?ng d?ch truy?n t?nh m?ch. ? B?ng cch ?i?u ch?nh n?ng ?? b?t th??ng c?a cc ch?t ?i?n gi?i. Vi?c ny th??ng ???c th?c hi?n b?ng cch ??a cc ch?t ?i?n gi?i qua m?t ?ng lu?n qua m?i va? va?o da? da?y cu?a quy? vi? (?ng thng m?i-d? dy hay ?ng NG). ? B?ng cch ?i?u tr? nguyn nhn m?t n??c bn  trong. Tun th? nh?ng h??ng d?n ny ? nh: Dung d?ch b n??c d?ng u?ng U?ng ORS n?u ???c chuyn gia ch?m [place] s?c kh?e c?a qu v? ch? d?n:  Pha ORS theo ch? d?n trn gi.  B?t ??u u?ng v?i l??ng nh?, kho?ng  c?c (120 mL), 5-10 pht m?t l?n.  T?ng t? t? l??ng n??c u?ng cho ??n khi qu v? dng ?? s? l??ng theo khuy?n ngh? c?a chuyn gia ch?m [place] s?c kh?e. ?n v u?ng         U?ng ?? ?? l?ng trong ?? gi? cho n??c ti?u c mu vng nh?t. N?u qu v? ???c ch? d?n u?ng ORS, hy u?ng h?t ORS tr??c, sau ? b?t ??u u?ng t? t? cc ?? l?ng trong khc. U?ng cc ?? l?ng nh?: ? N??c. Khng u?ng ?? u?ng ch? l n??c. U?ng ?? u?ng ch? l n??c c th? d?n ??n h? natri huy?t, t?c l c qu t mu?i (natri) trong c? th?. ? Qu v? c th? ht n??c t? ? bo. ? N??c p tri cy qu v? ? b? sung thm n??c (n??c p tri cy pha long). ? ?? u?ng th? thao t calo.  ?n cc lo?i th?c ph?m c cn b?ng cc ch?t ?i?n gi?i c l?i cho s?c kh?e, ch?ng h?n nh? chu?i, cam, khoai ty, c chua v rau bina.  Khng u?ng r??u/bia.  Tra?nh nh?ng th? sau: ? ?? u?ng ch?a nhi?u ???ng. Nh?ng ?? u?ng ny bao g?m ?? u?ng th? thao nhi?u calo, n??c p tri cy khng pha long v soda. ? Caffeine. ? Th?c ph?m nhi?u d?u m? ho?c c nhi?u ch?t bo ho?c ???ng. H??ng d?n chung  Ch? s? d?ng thu?c khng k ??n v thu?c k ??n theo ch? d?n c?a chuyn gia ch?m [place] s?c kh?e.  Khng dng natri d?ng vin nn. Vi?c ny c th? d?n ??n qu nhi?u natri trong c? th? (gy t?ng natri huy?t).  Tr? l?i sinh ho?t bnh th??ng theo ch? d?n c?a chuyn gia  ch?m [place] s?c kh?e. Hy h?i chuyn gia ch?m [place] s?c kh?e v? cc ho?t ??ng no l an ton cho qu v?.  Tun th? t?t c? cc l?n khm theo di theo ch? d?n c?a chuyn gia ch?m [place] s?c kh?e. ?i?u ny c vai tr quan tr?ng. Hy lin l?c v?i chuyn gia ch?m [place] s?c kh?e n?u qu v?:  B? ?au ? b?ng v c?n ?au tr? nn tr?m tr?ng h?n ho?c duy tr ? m?t khu v?c (?au c?c b?).  B? pht ban.  B? c?ng c?.  D? b?  kch ?ng h?n bnh th??ng.  Bu?n ng? h?n ho?c kh th?c gi?c h?n bnh th??ng.  C?m th?y chng m?t ho?c y?u.  R?t kht. Yu c?u tr? gip ngay l?p t?c n?u qu v? c:  B?t c? tri?u ch?ng m?t n??c n?ng no.  S?t.  ?au ??u d? d?i.  Cc tri?u ch?ng nn m?a, ch?ng h?n nh?: ? Nn m?a c?a qu v? tr?m tr?ng h?n ho?c khng h?t. ? Trong ch?t nn c mu ho?c ch?t mu xanh l (m?t). ? Qu v? khng th? ?n ho?c u?ng v vi?c ny khi?n qu v? b? nn.  Cc v?n ?? v?i ti?u ti?n ho?c ??i ti?n, ch?ng h?n nh?: ? Tiu ch?y tr? nn tr?m tr?ng h?n ho?c khng h?t. ? Mu trong phn (phn). ?i?u ny c th? khi?n cho phn c mu ?en v gi?ng h?c n. ? Khng ?i ti?u ho?c ch? ?i m?t l??ng nh? n??c ti?u r?t ??m mu, trong 6-8 gi?Marland Kitchen  Kh th?.  Cc tri?u ch?ng tr?m tr?ng h?n khi ???c ?i?u tr?Marland Kitchen Nh?ng tri?u ch?ng ny c th? l bi?u hi?n c?a m?t v?n ?? nghim tr?ng c?n c?p c?u. Khng ch? xem tri?u ch?ng c h?t khng. Hy ?i khm ngay l?p t?c. G?i cho d?ch v? c?p c?u t?i ??a ph??ng (911 ? [place] Khng t? li xe ??n b?nh vi?n. Tm t?t  M?t n??c l ti?nh tra?ng khng c ?? n??c ho?c d?ch khc trong c? th?. Tnh tr?ng ny x?y ra khi m?t ng??i m?t nhi?u n??c h?n l??ng n??c ng??i ? u?ng vo.  Vi?c ?i?u tr? ti?nh tra?ng ny ty thu?c vo m??c ?? n?ng. ?i?u tr? c?n ph?i ???c b?t ??u ngay l?p t?c. Khng ch? cho ??n khi m?t n??c tr? nn n?ng h?n.  U?ng ?? ?? l?ng trong ?? gi? cho n??c ti?u c mu vng nh?t. N?u qu v? ???c ch? d?n u?ng dung d?ch b n??c (ORS), hy u?ng h?t ORS tr??c, sau ? b?t ??u u?ng t? t? cc ?? l?ng trong khc.  Ch? s? d?ng thu?c khng k ??n v thu?c k ??n theo ch? d?n c?a chuyn gia ch?m [place] s?c kh?e.  Nh? gip ?? ngay l?p t?c n?u qu v? c b?t c? tri?u ch?ng m?t n??c n?ng no. Thng tin ny khng nh?m m?c ?ch thay th? cho l?i khuyn m chuyn gia ch?m [place] s?c kh?e ni v?i qu v?. Hy b?o ??m qu v? ph?i th?o lu?n b?t k? v?n ?? g m qu v? c v?i chuyn gia ch?m [place] s?c kh?e c?a qu v?. Document  Revised: 05/04/2019 Document Reviewed: 05/04/2019 [employer] Patient Education  2020 [employer].

## 2020-07-05 ENCOUNTER — Inpatient Hospital Stay: Payer: Medicaid Other

## 2020-07-05 ENCOUNTER — Inpatient Hospital Stay: Payer: Medicaid Other | Attending: Hematology and Oncology

## 2020-07-05 ENCOUNTER — Other Ambulatory Visit: Payer: Medicaid Other

## 2020-07-05 ENCOUNTER — Other Ambulatory Visit: Payer: Self-pay

## 2020-07-05 ENCOUNTER — Ambulatory Visit: Payer: Medicaid Other

## 2020-07-05 DIAGNOSIS — K5900 Constipation, unspecified: Secondary | ICD-10-CM | POA: Diagnosis not present

## 2020-07-05 DIAGNOSIS — E119 Type 2 diabetes mellitus without complications: Secondary | ICD-10-CM | POA: Insufficient documentation

## 2020-07-05 DIAGNOSIS — Z7982 Long term (current) use of aspirin: Secondary | ICD-10-CM | POA: Diagnosis not present

## 2020-07-05 DIAGNOSIS — Z5111 Encounter for antineoplastic chemotherapy: Secondary | ICD-10-CM | POA: Insufficient documentation

## 2020-07-05 DIAGNOSIS — N183 Chronic kidney disease, stage 3 unspecified: Secondary | ICD-10-CM | POA: Insufficient documentation

## 2020-07-05 DIAGNOSIS — Z9049 Acquired absence of other specified parts of digestive tract: Secondary | ICD-10-CM | POA: Diagnosis not present

## 2020-07-05 DIAGNOSIS — D61818 Other pancytopenia: Secondary | ICD-10-CM | POA: Insufficient documentation

## 2020-07-05 DIAGNOSIS — E854 Organ-limited amyloidosis: Secondary | ICD-10-CM

## 2020-07-05 DIAGNOSIS — Z79899 Other long term (current) drug therapy: Secondary | ICD-10-CM | POA: Diagnosis not present

## 2020-07-05 DIAGNOSIS — E8581 Light chain (AL) amyloidosis: Secondary | ICD-10-CM | POA: Diagnosis present

## 2020-07-05 DIAGNOSIS — Z8673 Personal history of transient ischemic attack (TIA), and cerebral infarction without residual deficits: Secondary | ICD-10-CM | POA: Diagnosis not present

## 2020-07-05 DIAGNOSIS — I5042 Chronic combined systolic (congestive) and diastolic (congestive) heart failure: Secondary | ICD-10-CM | POA: Diagnosis not present

## 2020-07-05 DIAGNOSIS — E785 Hyperlipidemia, unspecified: Secondary | ICD-10-CM | POA: Insufficient documentation

## 2020-07-05 DIAGNOSIS — Z5112 Encounter for antineoplastic immunotherapy: Secondary | ICD-10-CM | POA: Insufficient documentation

## 2020-07-05 LAB — CMP (CANCER CENTER ONLY)
ALT: 19 U/L (ref 0–44)
AST: 36 U/L (ref 15–41)
Albumin: 2.9 g/dL — ABNORMAL LOW (ref 3.5–5.0)
Alkaline Phosphatase: 333 U/L — ABNORMAL HIGH (ref 38–126)
Anion gap: 8 (ref 5–15)
BUN: 27 mg/dL — ABNORMAL HIGH (ref 8–23)
CO2: 22 mmol/L (ref 22–32)
Calcium: 8.1 mg/dL — ABNORMAL LOW (ref 8.9–10.3)
Chloride: 104 mmol/L (ref 98–111)
Creatinine: 1.36 mg/dL — ABNORMAL HIGH (ref 0.44–1.00)
GFR, Estimated: 40 mL/min — ABNORMAL LOW (ref >60)
Glucose, Bld: 131 mg/dL — ABNORMAL HIGH (ref 70–99)
Potassium: 3.5 mmol/L (ref 3.5–5.1)
Sodium: 134 mmol/L — ABNORMAL LOW (ref 135–145)
Total Bilirubin: 2.8 mg/dL — ABNORMAL HIGH (ref 0.3–1.2)
Total Protein: 5.8 g/dL — ABNORMAL LOW (ref 6.5–8.1)

## 2020-07-05 LAB — CBC WITH DIFFERENTIAL (CANCER CENTER ONLY)
Abs Immature Granulocytes: 0.06 10*3/uL (ref 0.00–0.07)
Basophils Absolute: 0 10*3/uL (ref 0.0–0.1)
Basophils Relative: 1 %
Eosinophils Absolute: 0.1 10*3/uL (ref 0.0–0.5)
Eosinophils Relative: 2 %
HCT: 28.9 % — ABNORMAL LOW (ref 36.0–46.0)
Hemoglobin: 9.9 g/dL — ABNORMAL LOW (ref 12.0–15.0)
Immature Granulocytes: 2 %
Lymphocytes Relative: 24 %
Lymphs Abs: 0.6 10*3/uL — ABNORMAL LOW (ref 0.7–4.0)
MCH: 35.1 pg — ABNORMAL HIGH (ref 26.0–34.0)
MCHC: 34.3 g/dL (ref 30.0–36.0)
MCV: 102.5 fL — ABNORMAL HIGH (ref 80.0–100.0)
Monocytes Absolute: 0.8 10*3/uL (ref 0.1–1.0)
Monocytes Relative: 29 %
Neutro Abs: 1.1 10*3/uL — ABNORMAL LOW (ref 1.7–7.7)
Neutrophils Relative %: 42 %
Platelet Count: 125 10*3/uL — ABNORMAL LOW (ref 150–400)
RBC: 2.82 MIL/uL — ABNORMAL LOW (ref 3.87–5.11)
RDW: 16 % — ABNORMAL HIGH (ref 11.5–15.5)
WBC Count: 2.6 10*3/uL — ABNORMAL LOW (ref 4.0–10.5)
nRBC: 0 % (ref 0.0–0.2)

## 2020-07-12 ENCOUNTER — Other Ambulatory Visit: Payer: Self-pay

## 2020-07-12 ENCOUNTER — Inpatient Hospital Stay: Payer: Medicaid Other

## 2020-07-12 ENCOUNTER — Encounter: Payer: Self-pay | Admitting: Nutrition

## 2020-07-12 ENCOUNTER — Inpatient Hospital Stay (HOSPITAL_BASED_OUTPATIENT_CLINIC_OR_DEPARTMENT_OTHER): Payer: Medicaid Other | Admitting: Hematology and Oncology

## 2020-07-12 VITALS — BP 106/77 | HR 55 | Temp 98.0°F | Resp 16 | Ht 60.0 in | Wt 129.1 lb

## 2020-07-12 DIAGNOSIS — E854 Organ-limited amyloidosis: Secondary | ICD-10-CM

## 2020-07-12 DIAGNOSIS — R197 Diarrhea, unspecified: Secondary | ICD-10-CM | POA: Diagnosis not present

## 2020-07-12 DIAGNOSIS — I43 Cardiomyopathy in diseases classified elsewhere: Secondary | ICD-10-CM

## 2020-07-12 DIAGNOSIS — Z5112 Encounter for antineoplastic immunotherapy: Secondary | ICD-10-CM | POA: Diagnosis not present

## 2020-07-12 LAB — CBC WITH DIFFERENTIAL (CANCER CENTER ONLY)
Abs Immature Granulocytes: 0.03 10*3/uL (ref 0.00–0.07)
Basophils Absolute: 0 10*3/uL (ref 0.0–0.1)
Basophils Relative: 1 %
Eosinophils Absolute: 0.1 10*3/uL (ref 0.0–0.5)
Eosinophils Relative: 2 %
HCT: 29.4 % — ABNORMAL LOW (ref 36.0–46.0)
Hemoglobin: 10 g/dL — ABNORMAL LOW (ref 12.0–15.0)
Immature Granulocytes: 1 %
Lymphocytes Relative: 41 %
Lymphs Abs: 1.3 10*3/uL (ref 0.7–4.0)
MCH: 35 pg — ABNORMAL HIGH (ref 26.0–34.0)
MCHC: 34 g/dL (ref 30.0–36.0)
MCV: 102.8 fL — ABNORMAL HIGH (ref 80.0–100.0)
Monocytes Absolute: 0.7 10*3/uL (ref 0.1–1.0)
Monocytes Relative: 25 %
Neutro Abs: 0.9 10*3/uL — ABNORMAL LOW (ref 1.7–7.7)
Neutrophils Relative %: 30 %
Platelet Count: 122 10*3/uL — ABNORMAL LOW (ref 150–400)
RBC: 2.86 MIL/uL — ABNORMAL LOW (ref 3.87–5.11)
RDW: 16 % — ABNORMAL HIGH (ref 11.5–15.5)
WBC Count: 3 10*3/uL — ABNORMAL LOW (ref 4.0–10.5)
nRBC: 0 % (ref 0.0–0.2)

## 2020-07-12 LAB — CMP (CANCER CENTER ONLY)
ALT: 13 U/L (ref 0–44)
AST: 33 U/L (ref 15–41)
Albumin: 3 g/dL — ABNORMAL LOW (ref 3.5–5.0)
Alkaline Phosphatase: 294 U/L — ABNORMAL HIGH (ref 38–126)
Anion gap: 11 (ref 5–15)
BUN: 19 mg/dL (ref 8–23)
CO2: 20 mmol/L — ABNORMAL LOW (ref 22–32)
Calcium: 8.4 mg/dL — ABNORMAL LOW (ref 8.9–10.3)
Chloride: 106 mmol/L (ref 98–111)
Creatinine: 1.39 mg/dL — ABNORMAL HIGH (ref 0.44–1.00)
GFR, Estimated: 39 mL/min — ABNORMAL LOW (ref >60)
Glucose, Bld: 88 mg/dL (ref 70–99)
Potassium: 4 mmol/L (ref 3.5–5.1)
Sodium: 137 mmol/L (ref 135–145)
Total Bilirubin: 2.2 mg/dL — ABNORMAL HIGH (ref 0.3–1.2)
Total Protein: 6 g/dL — ABNORMAL LOW (ref 6.5–8.1)

## 2020-07-12 NOTE — Progress Notes (Signed)
Patient requested and was provided one complimentary case of ensure plus.

## 2020-07-18 ENCOUNTER — Encounter: Payer: Self-pay | Admitting: Hematology and Oncology

## 2020-07-18 NOTE — Progress Notes (Signed)
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 Ordered during evaluation for cardiomyopathy.  2) 03/13/2020: establish care with Dr. 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  8) 07/12/2020: re-evaluated, continued having diarrhea. Labs improving.   Interval History:  Mikayla Reynolds 79 y.o. 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 The patient's last visit was on 06/28/2020. In the interim since the last visit she has continued to have diarrhea and chemotherapy has been held.   On exam today communication with the patient is assisted by an approved hospital [place] interpreter. [name] is accompanied by her son.  She reports that she has not been eating well because soon as she eats she has diarrhea almost immediately thereafter.  She has been trying Imodium with little help in slowing down her watery diarrhea.  She reports that for the most part it is watery, brown, does not contain any blood or mucus.  She also endorses  having some swelling in her legs.  She reports that the tramadol she has been taking has been good for her pain control.  She endorses having a cough which produces some mucus, but otherwise denies any fevers, chills, sweats, nausea, vomiting, or chest pain.  A full 10 point ROS is listed below.  MEDICAL HISTORY:  Past Medical History:  Diagnosis Date  . Abnormal liver function   . Anemia, unspecified   . Chronic combined systolic and diastolic CHF (congestive heart failure) ([place])   . CKD (chronic kidney disease), stage III ([place]) 02/19/2018  . Elevated troponin 12/27/2017  . Hyperlipidemia   . Junctional bradycardia   . Mitral regurgitation   . Pre-diabetes   . Renal mass   . Stroke [facility])     SURGICAL HISTORY: Past Surgical History:  Procedure Laterality Date  . abdominal wall surgery  2017  . APPENDECTOMY    . LOOP RECORDER INSERTION N/A 06/23/2018   Procedure: LOOP RECORDER INSERTION;  Surgeon: [name], MD;  Location: [place] CV LAB;  Service: Cardiovascular;  Laterality: N/A;  . TEE WITHOUT CARDIOVERSION N/A 06/23/2018   Procedure: TRANSESOPHAGEAL ECHOCARDIOGRAM (TEE);  Surgeon: [name], MD;  Location: [facility] ENDOSCOPY;  Service: Cardiovascular;  Laterality: N/A;    SOCIAL HISTORY: Social History   Socioeconomic History  . Marital status: Widowed    Spouse name: Not on file  . Number of children: Not on file  . Years of education: Not on file  . Highest education level: Not on file  Occupational History  . Not on file  Tobacco Use  . Smoking status: Never Smoker  . Smokeless tobacco: Never Used  Vaping Use  . Vaping Use: Never used  Substance and Sexual Activity  . Alcohol  use: Not Currently  . Drug use: Not Currently  . Sexual activity: Not Currently  Other Topics Concern  . Not on file  Social History Narrative  . Not on file   Social Determinants of Health   Financial Resource Strain:   . Difficulty of Paying Living Expenses: Not on file    Food Insecurity:   . Worried About Charity fundraiser in the Last Year: Not on file  . Ran Out of Food in the Last Year: Not on file  Transportation Needs:   . Lack of Transportation (Medical): Not on file  . Lack of Transportation (Non-Medical): Not on file  Physical Activity:   . Days of Exercise per Week: Not on file  . Minutes of Exercise per Session: Not on file  Stress:   . Feeling of Stress : Not on file  Social Connections:   . Frequency of Communication with Friends and Family: Not on file  . Frequency of Social Gatherings with Friends and Family: Not on file  . Attends Religious Services: Not on file  . Active Member of Clubs or Organizations: Not on file  . Attends Archivist Meetings: Not on file  . Marital Status: Not on file  Intimate Partner Violence:   . Fear of Current or Ex-Partner: Not on file  . Emotionally Abused: Not on file  . Physically Abused: Not on file  . Sexually Abused: Not on file    FAMILY HISTORY: Family History  Problem Relation Age of Onset  . Healthy Sister   . Healthy Brother     ALLERGIES:  has No Known Allergies.  MEDICATIONS:  Current Outpatient Medications  Medication Sig Dispense Refill  . acyclovir (ZOVIRAX) 400 MG tablet Take 1 tablet (400 mg total) by mouth daily. 30 tablet 3  . aspirin EC 81 MG tablet Take 1 tablet (81 mg total) by mouth daily. Swallow whole. 90 tablet 3  . atorvastatin (LIPITOR) 40 MG tablet Take 1 tablet (40 mg total) by mouth daily at 6 PM. 90 tablet 3  . furosemide (LASIX) 20 MG tablet Take 1 tablet (20 mg total) by mouth every other day. 90 tablet 3  . loperamide (IMODIUM) 2 MG capsule Take 1 capsule (2 mg total) by mouth as needed for diarrhea or loose stools. (Patient not taking: Reported on 06/15/2020) 60 capsule 1  . Methylcellulose, Laxative, (FIBER THERAPY) 500 MG TABS Take by mouth daily.    . midodrine (PROAMATINE) 5 MG tablet Take 1 tablet (5 mg total) by mouth 3 (three) times daily  with meals. 90 tablet 6  . Polyethylene Glycol 3350 (MIRALAX PO) Take by mouth.    . potassium chloride SA (KLOR-CON) 20 MEQ tablet Take 2 tablets by mouth for 2 days then hold 30 tablet 0  . spironolactone (ALDACTONE) 25 MG tablet Take 0.5 tablets (12.5 mg total) by mouth daily. 45 tablet 3  . traMADol (ULTRAM) 50 MG tablet Take 1 tablet (50 mg total) by mouth every 6 (six) hours as needed. 30 tablet 0   No current facility-administered medications for this visit.    REVIEW OF SYSTEMS:   Constitutional: ( - ) fevers, ( - )  chills , ( - ) night sweats Eyes: ( - ) blurriness of vision, ( - ) double vision, ( - ) watery eyes Ears, nose, mouth, throat, and face: ( - ) mucositis, ( - ) sore throat Respiratory: ( - ) cough, ( - ) dyspnea, ( - ) wheezes  Cardiovascular: ( - ) palpitation, ( - ) chest discomfort, ( - ) lower extremity swelling Gastrointestinal:  ( - ) nausea, ( - ) heartburn, ( - ) change in bowel habits Skin: ( - ) abnormal skin rashes Lymphatics: ( - ) new lymphadenopathy, ( - ) easy bruising Neurological: ( - ) numbness, ( - ) tingling, ( - ) new weaknesses Behavioral/Psych: ( - ) mood change, ( - ) new changes  All other systems were reviewed with the patient and are negative.  PHYSICAL EXAMINATION: ECOG PERFORMANCE STATUS: 1 - Symptomatic but completely ambulatory  Vitals:   07/12/20 0851  BP: 106/77  Pulse: (!) 55  Resp: 16  Temp: 98 F (36.7 C)  SpO2: 100%   Filed Weights   07/12/20 0851  Weight: 129 lb 1.6 oz (58.6 kg)    GENERAL: well appearing elderly [place] female. alert, no distress and comfortable SKIN: skin color, texture, turgor are normal, no rashes or significant lesions EYES: conjunctiva are pink and non-injected, sclera clear LUNGS: clear to auscultation and percussion with normal breathing effort HEART: regular rate & rhythm and no murmurs and no lower extremity edema Musculoskeletal: no cyanosis of digits and no clubbing  PSYCH: alert &  oriented x 3, fluent speech NEURO: no focal motor/sensory deficits  LABORATORY DATA:  I have reviewed the data as listed CBC Latest Ref Rng & Units 07/12/2020 07/05/2020 06/28/2020  WBC 4.0 - 10.5 K/uL 3.0(L) 2.6(L) 2.0(L)  Hemoglobin 12.0 - 15.0 g/dL 10.0(L) 9.9(L) 9.5(L)  Hematocrit 36 - 46 % 29.4(L) 28.9(L) 26.2(L)  Platelets 150 - 400 K/uL 122(L) 125(L) 65(L)    CMP Latest Ref Rng & Units 07/12/2020 07/05/2020 06/28/2020  Glucose 70 - 99 mg/dL 88 131(H) 134(H)  BUN 8 - 23 mg/dL 19 27(H) 73(H)  Creatinine 0.44 - 1.00 mg/dL 1.39(H) 1.36(H) 2.52(H)  Sodium 135 - 145 mmol/L 137 134(L) 137  Potassium 3.5 - 5.1 mmol/L 4.0 3.5 3.2(L)  Chloride 98 - 111 mmol/L 106 104 110  CO2 22 - 32 mmol/L 20(L) 22 18(L)  Calcium 8.9 - 10.3 mg/dL 8.4(L) 8.1(L) 8.3(L)  Total Protein 6.5 - 8.1 g/dL 6.0(L) 5.8(L) 5.8(L)  Total Bilirubin 0.3 - 1.2 mg/dL 2.2(H) 2.8(H) 2.8(H)  Alkaline Phos 38 - 126 U/L 294(H) 333(H) 277(H)  AST 15 - 41 U/L 33 36 42(H)  ALT 0 - 44 U/L _0 Lab Results  Component Value Date   MPROTEIN 1.1 (H) 06/14/2020   MPROTEIN 2.7 (H) 03/13/2020   MPROTEIN 2.4 (H) 03/01/2020   Lab Results  Component Value Date   KPAFRELGTCHN 18.5 06/14/2020   KPAFRELGTCHN 23.8 (H) 03/13/2020   LAMBDASER 16.9 06/14/2020   LAMBDASER 34.5 (H) 03/13/2020   KAPLAMBRATIO 1.09 06/14/2020   KAPLAMBRATIO 7.27 03/23/2020   KAPLAMBRATIO 0.69 03/13/2020     PATHOLOGY: Surgical Pathology  CASE: [id number]  PATIENT: Fredrica Remington  Bone Marrow Report   Clinical History: None provided   DIAGNOSIS:   BONE MARROW, ASPIRATE, CLOT, CORE:  - Cellular marrow involved by a plasma cell neoplasm (20%)  - See comment and microscopic description below   PERIPHERAL BLOOD:  - Pancytopenia  - See complete blood cell count   COMMENT:   The bone marrow biopsy is involved by a plasma cell neoplasm, 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 Correlation with radiographic findings  and laboratory  data is required to characterize this plasma cell  neoplasm. There was no amyloid detected on the core biopsy or clot  section; however, the sample was suboptimal for evaluation.   MICROSCOPIC DESCRIPTION:   PERIPHERAL BLOOD SMEAR: The peripheral blood has a macrocytic anemia,  leukopenia and thrombocytopenia. Red cell morphology shows mild  anisocytosis. There is no rouleaux formation. Leukocytes are  morphologically unremarkable. Circulating plasma cellsare not  identified.   BONE MARROW ASPIRATE: Spicular, cellular and adequate for evaluation  Erythroid precursors: Orderly maturation without overt dysplasia  Granulocytic precursors: Orderly maturation without overt dysplasia  Megakaryocytes: Qualitatively and quantitatively unremarkable  Lymphocytes/plasma cells: There are increased plasma cells (16% by  manual differential counts) some are enlarged with multi nucleated forms  present. There is no lymphocytosis.   TOUCH PREPARATIONS: Markedly hypocellular with no additional findings as  compared to the aspirate smear   CLOT AND BIOPSY: Examination of the bone marrow core biopsy and clot  section reveals a variably cellular marrow (30%) with trilineage  hematopoiesis. CD138 highlights clusters of plasma cells (approximately  20% of the total marrow cellularity) which are lambda restricted by  kappa and lambda in situ hybridization. There is a mixed population of  B and T cells by CD20 and CD3 immunohistochemistry respectively. [place]  red performed on the core biopsy and clot section is NEGATIVE.   IRON STAIN: Iron stains are performed on a bone marrow aspirate or touch  imprint smear and section of clot. The controls stained appropriately.     Storage Iron: Present    Ring Sideroblasts: Not identified   ADDITIONAL DATA/TESTING: Cytogenetics is pending and will be reported  separately.   CELL COUNT DATA:   Bone Marrow count performed on 500 cells shows:    Blasts:  0%  Myeloid: 44%  Promyelocytes: 0%  Erythroid:   28%  Myelocytes:  7%  Lymphocytes:  7%  Metamyelocytes:   0%  Plasma cells: 16%  Bands:  8%  Neutrophils:  23% M:E ratio:   1.57  Eosinophils:  5%  Basophils:   1%  Monocytes:   5%   Lab Data: CBC performed on 03/23/2020 shows:  WBC: 3.5 k/uL Neutrophils:  59%  Hgb: 11.0 g/dL Lymphocytes:  20%  HCT: 33.1 %  Monocytes:   14%  MCV: 105.4 fL Eosinophils:  6%  RDW: 14.6 %  Basophils:   1%  PLT: 139 k/uL   GROSS DESCRIPTION:   A. Bone marrow aspirate smear.   B. Received in B-plus fixative is a 1.2 x 1 x 0.1 cm aggregate of  tan-brown tissue, submitted in 1 cassette.   C. Received in B-plus fixative is a 1.2 cm in length by 0.2 cm diameter  hard tan-brown soft tissue core, submitted in 1 cassette following  decalcification in Immunocal. (AK 03/23/2020)%    Final Diagnosis performed by Thressa Sheller, MD.  Electronically signed  03/28/2020    RADIOGRAPHIC STUDIES: I have personally reviewed the radiological images as listed and agreed with the findings in the report: no evidence of lytic lesions on bone scan.  DG Thoracic Spine 2 View  Result Date: 06/22/2020 CLINICAL DATA:  New onset back pain.  Monoclonal gammopathy. EXAM: THORACIC SPINE 2 VIEWS COMPARISON:  None. FINDINGS: Diffuse bone demineralization. Normal alignment of the thoracic spine. No vertebral compression deformities. Degenerative changes with narrowed interspaces and endplate hypertrophic change. No focal bone lesion or bone destruction. No abnormal paraspinal soft tissue thickening. Calcification of the aorta. Loop recorder. IMPRESSION: Diffuse bone demineralization. No acute displaced fractures identified. Electronically Signed   By: Lucienne Capers M.D.   On: 06/22/2020 22:42  DG Lumbar Spine 2-3 Views  Result Date: 06/22/2020 CLINICAL DATA:  New onset back pain, history of monoclonal gammopathy EXAM: LUMBAR  SPINE - 2-3 VIEW COMPARISON:  None. FINDINGS: Five lumbar type vertebral bodies are well visualized. Vertebral body height is well maintained. No soft tissue abnormality is seen. Mild anterolisthesis of L5 on S1 is noted which appears to be degenerative in nature. IMPRESSION: Mild degenerative change without acute abnormality. Electronically Signed   By: Inez Catalina M.D.   On: 06/22/2020 22:42    ASSESSMENT & PLAN Mikayla Reynolds 79 y.o. female with medical history significant for CHF, CKD, HLD, CVA, and DM type II who presents for evaluation of a monoclonal gammopathy. After review of the labs, discussion with the patient, and reviewed the prior records the patient's findings are concerning for a plasma cell dyscrasia and possible amyloidosis.The patient has cardiac dysfunction with an equivocal myocardial amyloid imaging study performed on 02/28/2020 and a bone marrow biopsy on 03/23/2020 which showed 20% plasma cell population (but no evidence of amyloidosis). Cardiac MRI on 04/19/2020 is highly concerning for amyloid infiltrate. Based on this, we can presumptively start treatment while we await confirmation of amyloid with a tissue sample.   On exam today Mrs. Barnick is accompanied by her son.  He is continued to have watery diarrhea despite discontinuation of the chemotherapy.  She is having some mucus production and cough, but otherwise her labs are improving and her weakness has improved from her last visit.  At this time I would recommend that we follow-up with her in 1 more week for consideration of restarting therapy.  In order to confirm the diagnosis of amyloidosis we will need to obtain further tissue samples. Unfortunately the fat pad biopsy is negative, we need to consider biopsy of the liver versus cardiac biopsy in order to help confirm the diagnosis.  Based on her plasma cells and imaging there is strong supporting evidence to start treatment now while we await these biopsy results.  We require  the amyloid protein in order to effectively confirm the diagnosis.   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  As such the treatment of choice would be to target her plasma cell population with a triplet or quadruplet therapy.  Therapy of choice in this case would consist of daratumumab, Velcade, cyclophosphamide, and dexamethasone.  Given the patient's advanced age I would recommend that we start initially with the Velcade, cyclophosphamide, and Dex and then add the daratumumab on if she was shown to tolerate the treatment.  I discussed the side effects of this chemotherapy with the patient including neuropathy, elevated blood pressure, drop in blood counts, hypersensitivity reaction, chest tightness, increased infection risk, and fatigue.  The patient voiced her understanding of these findings and is agreeable to move forward with triple therapy with the intention of advancing to quadruple therapy if well tolerated.  The regimen of choice will be daratumumab, bortezomib, cyclophosphamide and dexamethasone per the ANDROMEDA Study ( Blood. 2020 Jul 2;136(1):71-80). We will modify this to start with CyBorD with the addition of daratumumab if she is shown to tolerate initial therapy.   Treatment consists of: Cyclophosphamide 300 mg/m2 intravenously and bortezomib 1.3 mg/m2 subcutaneously were given on days 1, 8, 15, and 22 of each 28 day cycle for up to 6 cycles. Dexamethasone 40 mg (starting dose) was given orally or intravenously weekly for each cycle for up to 6 cycles. DARA [place] was administered in a single,  premixed vial and given by manual [place] injection over the course of 3 to 5 minutes weekly in cycles 1 to 2, every 2 weeks in cycles 3 to 6, and every 4 weeks thereafter as monotherapy for a maximum of 2 years.  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 In order to confirm  the diagnosis. In order to confirm diagnosis the next best step would be a biopsy of cardiac tissue. This may not be feasible moving forward.  --given the ample evidence of an AL amyloidosis will start treatment while awaiting final pathological diagnosis. Further delay may result in worsening heart function/decline.  --The patient's elevations in LFTs may also indicate amyloid involvement of the liver (or more likely congestive hepatopathy).  --monoclonal gammopathy reveals no CRAB criteria. 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  -- given the patient's condition she is a good candidate for systemic treatment, though treatment is on hold due to recent decline in the labs. --recommend HOLDING chemotherapy on 06/28/2020 due to worsening kidney function and deconditioning. --RTC in 1 week, for consideration of continued CyBorD.  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  --counts are improving off chemo on exam today --continue to monitor   Orders Placed This Encounter  Procedures  . Culture, Stool    Standing Status:   Future    Standing Expiration Date:   07/12/2021   All questions were answered. The patient knows to call the clinic with any problems, questions or concerns.  A total of more than 30 minutes were spent on this encounter and over half of that time was spent on counseling and coordination of care as outlined above.   Ledell Peoples, MD Department of Hematology/Oncology [place] at [facility] Phone: [phone] Pager: [phone] Email: [email or web address]  07/18/2020 1:41 PM

## 2020-07-19 ENCOUNTER — Inpatient Hospital Stay: Payer: Medicaid Other

## 2020-07-19 ENCOUNTER — Ambulatory Visit: Payer: Medicaid Other

## 2020-07-19 ENCOUNTER — Other Ambulatory Visit: Payer: Self-pay | Admitting: Hematology and Oncology

## 2020-07-19 ENCOUNTER — Other Ambulatory Visit: Payer: Self-pay

## 2020-07-19 ENCOUNTER — Other Ambulatory Visit: Payer: Medicaid Other

## 2020-07-19 VITALS — BP 124/51 | HR 55 | Temp 98.0°F | Resp 16

## 2020-07-19 DIAGNOSIS — E854 Organ-limited amyloidosis: Secondary | ICD-10-CM

## 2020-07-19 DIAGNOSIS — I43 Cardiomyopathy in diseases classified elsewhere: Secondary | ICD-10-CM

## 2020-07-19 DIAGNOSIS — Z5112 Encounter for antineoplastic immunotherapy: Secondary | ICD-10-CM | POA: Diagnosis not present

## 2020-07-19 LAB — CBC WITH DIFFERENTIAL (CANCER CENTER ONLY)
Abs Immature Granulocytes: 0.01 10*3/uL (ref 0.00–0.07)
Basophils Absolute: 0 10*3/uL (ref 0.0–0.1)
Basophils Relative: 1 %
Eosinophils Absolute: 0.1 10*3/uL (ref 0.0–0.5)
Eosinophils Relative: 2 %
HCT: 30.9 % — ABNORMAL LOW (ref 36.0–46.0)
Hemoglobin: 10.3 g/dL — ABNORMAL LOW (ref 12.0–15.0)
Immature Granulocytes: 0 %
Lymphocytes Relative: 27 %
Lymphs Abs: 0.9 10*3/uL (ref 0.7–4.0)
MCH: 34.9 pg — ABNORMAL HIGH (ref 26.0–34.0)
MCHC: 33.3 g/dL (ref 30.0–36.0)
MCV: 104.7 fL — ABNORMAL HIGH (ref 80.0–100.0)
Monocytes Absolute: 0.7 10*3/uL (ref 0.1–1.0)
Monocytes Relative: 23 %
Neutro Abs: 1.5 10*3/uL — ABNORMAL LOW (ref 1.7–7.7)
Neutrophils Relative %: 47 %
Platelet Count: 132 10*3/uL — ABNORMAL LOW (ref 150–400)
RBC: 2.95 MIL/uL — ABNORMAL LOW (ref 3.87–5.11)
RDW: 16.5 % — ABNORMAL HIGH (ref 11.5–15.5)
WBC Count: 3.3 10*3/uL — ABNORMAL LOW (ref 4.0–10.5)
nRBC: 0 % (ref 0.0–0.2)

## 2020-07-19 LAB — CMP (CANCER CENTER ONLY)
ALT: 11 U/L (ref 0–44)
AST: 32 U/L (ref 15–41)
Albumin: 2.9 g/dL — ABNORMAL LOW (ref 3.5–5.0)
Alkaline Phosphatase: 257 U/L — ABNORMAL HIGH (ref 38–126)
Anion gap: 9 (ref 5–15)
BUN: 16 mg/dL (ref 8–23)
CO2: 22 mmol/L (ref 22–32)
Calcium: 8.4 mg/dL — ABNORMAL LOW (ref 8.9–10.3)
Chloride: 106 mmol/L (ref 98–111)
Creatinine: 1.31 mg/dL — ABNORMAL HIGH (ref 0.44–1.00)
GFR, Estimated: 42 mL/min — ABNORMAL LOW (ref >60)
Glucose, Bld: 122 mg/dL — ABNORMAL HIGH (ref 70–99)
Potassium: 3.8 mmol/L (ref 3.5–5.1)
Sodium: 137 mmol/L (ref 135–145)
Total Bilirubin: 1.7 mg/dL — ABNORMAL HIGH (ref 0.3–1.2)
Total Protein: 5.8 g/dL — ABNORMAL LOW (ref 6.5–8.1)

## 2020-07-19 MED ORDER — SODIUM CHLORIDE 0.9 % IV SOLN
40.0000 mg | Freq: Once | INTRAVENOUS | Status: AC
  Administered 2020-07-19: 40 mg via INTRAVENOUS
  Filled 2020-07-19: qty 4

## 2020-07-19 MED ORDER — PALONOSETRON HCL INJECTION 0.25 MG/5ML
0.2500 mg | Freq: Once | INTRAVENOUS | Status: AC
  Administered 2020-07-19: 0.25 mg via INTRAVENOUS

## 2020-07-19 MED ORDER — PALONOSETRON HCL INJECTION 0.25 MG/5ML
INTRAVENOUS | Status: AC
  Filled 2020-07-19: qty 5

## 2020-07-19 MED ORDER — SODIUM CHLORIDE 0.9 % IV SOLN
225.0000 mg/m2 | Freq: Once | INTRAVENOUS | Status: AC
  Administered 2020-07-19: 340 mg via INTRAVENOUS
  Filled 2020-07-19: qty 17

## 2020-07-19 MED ORDER — SODIUM CHLORIDE 0.9 % IV SOLN
Freq: Once | INTRAVENOUS | Status: AC
  Filled 2020-07-19: qty 250

## 2020-07-19 MED ORDER — BORTEZOMIB CHEMO SQ INJECTION 3.5 MG (2.5MG/ML)
1.5000 mg/m2 | Freq: Once | INTRAMUSCULAR | Status: AC
  Administered 2020-07-19: 2.25 mg via SUBCUTANEOUS
  Filled 2020-07-19: qty 0.9

## 2020-07-19 NOTE — Patient Instructions (Signed)
[facility] Discharge Instructions for Patients Receiving Chemotherapy  Today you received the following chemotherapy agents: bortezomib and cyclophosphamide.  To help prevent nausea and vomiting after your treatment, we encourage you to take your nausea medication as directed.   If you develop nausea and vomiting that is not controlled by your nausea medication, call the clinic.   BELOW ARE SYMPTOMS THAT SHOULD BE REPORTED IMMEDIATELY:  *FEVER GREATER THAN 100.5 F  *CHILLS WITH OR WITHOUT FEVER  NAUSEA AND VOMITING THAT IS NOT CONTROLLED WITH YOUR NAUSEA MEDICATION  *UNUSUAL SHORTNESS OF BREATH  *UNUSUAL BRUISING OR BLEEDING  TENDERNESS IN MOUTH AND THROAT WITH OR WITHOUT PRESENCE OF ULCERS  *URINARY PROBLEMS  *BOWEL PROBLEMS  UNUSUAL RASH Items with * indicate a potential emergency and should be followed up as soon as possible.  Feel free to call the clinic should you have any questions or concerns. The clinic phone number is [phone].  Please show the [facility] at check-in to the Emergency Department and triage nurse.   Implanted [facility] Guide An implanted port is a device that is placed under the skin. It is usually placed in the chest. The device can be used to give IV medicine, to take blood, or for dialysis. You may have an implanted port if:  You need IV medicine that would be irritating to the small veins in your hands or arms.  You need IV medicines, such as antibiotics, for a long period of time.  You need IV nutrition for a long period of time.  You need dialysis. Having a port means that your health care provider will not need to use the veins in your arms for these procedures. You may have fewer limitations when using a port than you would if you used other types of long-term IVs, and you will likely be able to return to normal activities after your incision heals. An implanted port has two main parts:  Reservoir. The  reservoir is the part where a needle is inserted to give medicines or draw blood. The reservoir is round. After it is placed, it appears as a small, raised area under your skin.  Catheter. The catheter is a thin, flexible tube that connects the reservoir to a vein. Medicine that is inserted into the reservoir goes into the catheter and then into the vein. How is my port accessed? To access your port:  A numbing cream may be placed on the skin over the port site.  Your health care provider will put on a mask and sterile gloves.  The skin over your port will be cleaned carefully with a germ-killing soap and allowed to dry.  Your health care provider will gently pinch the port and insert a needle into it.  Your health care provider will check for a blood return to make sure the port is in the vein and is not clogged.  If your port needs to remain accessed to get medicine continuously (constant infusion), your health care provider will place a clear bandage (dressing) over the needle site. The dressing and needle will need to be changed every week, or as told by your health care provider. What is flushing? Flushing helps keep the port from getting clogged. Follow instructions from your health care provider about how and when to flush the port. Ports are usually flushed with saline solution or a medicine called heparin. The need for flushing will depend on how the port is used:  If the port  is only used from time to time to give medicines or draw blood, the port may need to be flushed: ? Before and after medicines have been given. ? Before and after blood has been drawn. ? As part of routine maintenance. Flushing may be recommended every 4-6 weeks.  If a constant infusion is running, the port may not need to be flushed.  Throw away any syringes in a disposal container that is meant for sharp items (sharps container). You can buy a sharps container from a pharmacy, or you can make one by using  an empty hard plastic bottle with a cover. How long will my port stay implanted? The port can stay in for as long as your health care provider thinks it is needed. When it is time for the port to come out, a surgery will be done to remove it. The surgery will be similar to the procedure that was done to put the port in. Follow these instructions at home:   Flush your port as told by your health care provider.  If you need an infusion over several days, follow instructions from your health care provider about how to take care of your port site. Make sure you: ? Wash your hands with soap and water before you change your dressing. If soap and water are not available, use alcohol-based hand sanitizer. ? Change your dressing as told by your health care provider. ? Place any used dressings or infusion bags into a plastic bag. Throw that bag in the trash. ? Keep the dressing that covers the needle clean and dry. Do not get it wet. ? Do not use scissors or sharp objects near the tube. ? Keep the tube clamped, unless it is being used.  Check your port site every day for signs of infection. Check for: ? Redness, swelling, or pain. ? Fluid or blood. ? Pus or a bad smell.  Protect the skin around the port site. ? Avoid wearing bra straps that rub or irritate the site. ? Protect the skin around your port from seat belts. Place a soft pad over your chest if needed.  Bathe or shower as told by your health care provider. The site may get wet as long as you are not actively receiving an infusion.  Return to your normal activities as told by your health care provider. Ask your health care provider what activities are safe for you.  Carry a medical alert card or wear a medical alert bracelet at all times. This will let health care providers know that you have an implanted port in case of an emergency. Get help right away if:  You have redness, swelling, or pain at the port site.  You have fluid or  blood coming from your port site.  You have pus or a bad smell coming from the port site.  You have a fever. Summary  Implanted ports are usually placed in the chest for long-term IV access.  Follow instructions from your health care provider about flushing the port and changing bandages (dressings).  Take care of the area around your port by avoiding clothing that puts pressure on the area, and by watching for signs of infection.  Protect the skin around your port from seat belts. Place a soft pad over your chest if needed.  Get help right away if you have a fever or you have redness, swelling, pain, drainage, or a bad smell at the port site. This information is not intended to replace  advice given to you by your health care provider. Make sure you discuss any questions you have with your health care provider. Document Revised: 12/11/2018 Document Reviewed: 09/21/2016 Elsevier Patient Education  2020 [employer].

## 2020-07-19 NOTE — Progress Notes (Signed)
Ok to treat with T. Bili of 1.7 per Dr. Lorenso Courier

## 2020-07-24 ENCOUNTER — Ambulatory Visit (INDEPENDENT_AMBULATORY_CARE_PROVIDER_SITE_OTHER): Payer: Medicaid Other

## 2020-07-24 DIAGNOSIS — I639 Cerebral infarction, unspecified: Secondary | ICD-10-CM

## 2020-07-24 LAB — CUP PACEART REMOTE DEVICE CHECK
Date Time Interrogation Session: 20211121233634
Implantable Pulse Generator Implant Date: 20191022

## 2020-07-25 ENCOUNTER — Other Ambulatory Visit: Payer: Self-pay | Admitting: Hematology and Oncology

## 2020-07-25 DIAGNOSIS — E854 Organ-limited amyloidosis: Secondary | ICD-10-CM

## 2020-07-26 ENCOUNTER — Encounter: Payer: Self-pay | Admitting: Hematology and Oncology

## 2020-07-26 ENCOUNTER — Inpatient Hospital Stay: Payer: Medicaid Other

## 2020-07-26 ENCOUNTER — Inpatient Hospital Stay (HOSPITAL_BASED_OUTPATIENT_CLINIC_OR_DEPARTMENT_OTHER): Payer: Medicaid Other | Admitting: Hematology and Oncology

## 2020-07-26 ENCOUNTER — Other Ambulatory Visit: Payer: Self-pay

## 2020-07-26 VITALS — BP 100/68 | HR 62 | Temp 97.2°F | Resp 18 | Ht 60.0 in | Wt 134.8 lb

## 2020-07-26 DIAGNOSIS — I43 Cardiomyopathy in diseases classified elsewhere: Secondary | ICD-10-CM

## 2020-07-26 DIAGNOSIS — E854 Organ-limited amyloidosis: Secondary | ICD-10-CM | POA: Diagnosis not present

## 2020-07-26 DIAGNOSIS — N1831 Chronic kidney disease, stage 3a: Secondary | ICD-10-CM | POA: Diagnosis not present

## 2020-07-26 DIAGNOSIS — Z5112 Encounter for antineoplastic immunotherapy: Secondary | ICD-10-CM | POA: Diagnosis not present

## 2020-07-26 LAB — CBC WITH DIFFERENTIAL (CANCER CENTER ONLY)
Abs Immature Granulocytes: 0.04 10*3/uL (ref 0.00–0.07)
Basophils Absolute: 0 10*3/uL (ref 0.0–0.1)
Basophils Relative: 1 %
Eosinophils Absolute: 0.1 10*3/uL (ref 0.0–0.5)
Eosinophils Relative: 2 %
HCT: 32.4 % — ABNORMAL LOW (ref 36.0–46.0)
Hemoglobin: 10.8 g/dL — ABNORMAL LOW (ref 12.0–15.0)
Immature Granulocytes: 1 %
Lymphocytes Relative: 10 %
Lymphs Abs: 0.4 10*3/uL — ABNORMAL LOW (ref 0.7–4.0)
MCH: 35.1 pg — ABNORMAL HIGH (ref 26.0–34.0)
MCHC: 33.3 g/dL (ref 30.0–36.0)
MCV: 105.2 fL — ABNORMAL HIGH (ref 80.0–100.0)
Monocytes Absolute: 0.7 10*3/uL (ref 0.1–1.0)
Monocytes Relative: 18 %
Neutro Abs: 2.8 10*3/uL (ref 1.7–7.7)
Neutrophils Relative %: 68 %
Platelet Count: 106 10*3/uL — ABNORMAL LOW (ref 150–400)
RBC: 3.08 MIL/uL — ABNORMAL LOW (ref 3.87–5.11)
RDW: 16.2 % — ABNORMAL HIGH (ref 11.5–15.5)
WBC Count: 4.1 10*3/uL (ref 4.0–10.5)
nRBC: 0 % (ref 0.0–0.2)

## 2020-07-26 LAB — LACTATE DEHYDROGENASE: LDH: 397 U/L — ABNORMAL HIGH (ref 98–192)

## 2020-07-26 LAB — CMP (CANCER CENTER ONLY)
ALT: 12 U/L (ref 0–44)
AST: 30 U/L (ref 15–41)
Albumin: 3 g/dL — ABNORMAL LOW (ref 3.5–5.0)
Alkaline Phosphatase: 246 U/L — ABNORMAL HIGH (ref 38–126)
Anion gap: 10 (ref 5–15)
BUN: 18 mg/dL (ref 8–23)
CO2: 22 mmol/L (ref 22–32)
Calcium: 8.4 mg/dL — ABNORMAL LOW (ref 8.9–10.3)
Chloride: 104 mmol/L (ref 98–111)
Creatinine: 1.12 mg/dL — ABNORMAL HIGH (ref 0.44–1.00)
GFR, Estimated: 50 mL/min — ABNORMAL LOW (ref >60)
Glucose, Bld: 119 mg/dL — ABNORMAL HIGH (ref 70–99)
Potassium: 3.8 mmol/L (ref 3.5–5.1)
Sodium: 136 mmol/L (ref 135–145)
Total Bilirubin: 2.7 mg/dL — ABNORMAL HIGH (ref 0.3–1.2)
Total Protein: 6.1 g/dL — ABNORMAL LOW (ref 6.5–8.1)

## 2020-07-26 MED ORDER — TRAMADOL HCL 50 MG PO TABS: 50.0000 mg | ORAL_TABLET | Freq: Four times a day (QID) | ORAL | 0 refills | Status: AC | PRN

## 2020-07-26 MED ORDER — BORTEZOMIB CHEMO SQ INJECTION 3.5 MG (2.5MG/ML)
1.5000 mg/m2 | Freq: Once | INTRAMUSCULAR | Status: AC
  Administered 2020-07-26: 2.25 mg via SUBCUTANEOUS
  Filled 2020-07-26: qty 0.9

## 2020-07-26 MED ORDER — ACETAMINOPHEN 500 MG PO TABS: 500.0000 mg | ORAL_TABLET | Freq: Four times a day (QID) | ORAL | 0 refills | Status: AC | PRN

## 2020-07-26 MED ORDER — SODIUM CHLORIDE 0.9 % IV SOLN
40.0000 mg | Freq: Once | INTRAVENOUS | Status: AC
  Administered 2020-07-26: 40 mg via INTRAVENOUS
  Filled 2020-07-26: qty 4

## 2020-07-26 MED ORDER — SODIUM CHLORIDE 0.9 % IV SOLN
Freq: Once | INTRAVENOUS | Status: AC
  Filled 2020-07-26: qty 250

## 2020-07-26 MED ORDER — GUAIFENESIN ER 600 MG PO TB12: 600.0000 mg | ORAL_TABLET | Freq: Two times a day (BID) | ORAL | 1 refills | Status: AC | PRN

## 2020-07-26 MED ORDER — PALONOSETRON HCL INJECTION 0.25 MG/5ML
0.2500 mg | Freq: Once | INTRAVENOUS | Status: AC
  Administered 2020-07-26: 0.25 mg via INTRAVENOUS

## 2020-07-26 MED ORDER — PALONOSETRON HCL INJECTION 0.25 MG/5ML
INTRAVENOUS | Status: AC
  Filled 2020-07-26: qty 5

## 2020-07-26 MED ORDER — SODIUM CHLORIDE 0.9 % IV SOLN
225.0000 mg/m2 | Freq: Once | INTRAVENOUS | Status: AC
  Administered 2020-07-26: 340 mg via INTRAVENOUS
  Filled 2020-07-26: qty 17

## 2020-07-26 NOTE — Progress Notes (Signed)
Carelink Summary Report / Loop Recorder 

## 2020-07-26 NOTE — Progress Notes (Signed)
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 Ordered during evaluation for cardiomyopathy.  2) 03/13/2020: establish care with Dr. 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  8) 07/12/2020: re-evaluated, continued having diarrhea. Labs improving.  9) 07/19/2020: re-start CyBorD chemotherapy  Interval History:  Mikayla Reynolds 79 y.o. 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 The patient's last visit was on 07/12/2020. In the interim since the last visit she has restarted CyBorD chemotherapy.   On exam today communication with the patient is assisted by an approved hospital [place] interpreter. Mikayla Reynolds is accompanied by her son.  She reports she is no longer having diarrhea but instead is having constipation.  She is also having swelling of her legs and worsening of her back pain.  She reports that she is finds it difficult to find a position to be comfortable.  She is also not walking as much as a result of this pain.  She  previously had x-ray imaging of the spine approximately 1 month ago and results were normal.  She denies having any issues with nausea, vomiting, or fevers, chills, sweats.  She is willing and able to proceed with chemotherapy at this time.  A full 10 point ROS is listed below.  MEDICAL HISTORY:  Past Medical History:  Diagnosis Date  . Abnormal liver function   . Anemia, unspecified   . Chronic combined systolic and diastolic CHF (congestive heart failure) ([place])   . CKD (chronic kidney disease), stage III ([place]) 02/19/2018  . Elevated troponin 12/27/2017  . Hyperlipidemia   . Junctional bradycardia   . Mitral regurgitation   . Pre-diabetes   . Renal mass   . Stroke [facility])     SURGICAL HISTORY: Past Surgical History:  Procedure Laterality Date  . abdominal wall surgery  2017  . APPENDECTOMY    . LOOP RECORDER INSERTION N/A 06/23/2018   Procedure: LOOP RECORDER INSERTION;  Surgeon: [name], MD;  Location: [facility];  Service: Cardiovascular;  Laterality: N/A;  . TEE WITHOUT CARDIOVERSION N/A 06/23/2018   Procedure: TRANSESOPHAGEAL ECHOCARDIOGRAM (TEE);  Surgeon: [name], MD;  Location: [facility] ENDOSCOPY;  Service: Cardiovascular;  Laterality: N/A;    SOCIAL HISTORY: Social History   Socioeconomic History  . Marital status: Widowed    Spouse name: Not on file  . Number of children: Not on file  . Years of education: Not on file  . Highest education level: Not on file  Occupational History  . Not on file  Tobacco Use  . Smoking status: Never Smoker  . Smokeless tobacco: Never Used  Vaping Use  . Vaping Use: Never used  Substance and Sexual Activity  . Alcohol use: Not Currently  .  Drug use: Not Currently  . Sexual activity: Not Currently  Other Topics Concern  . Not on file  Social History Narrative  . Not on file   Social Determinants of Health   Financial Resource Strain:   . Difficulty of Paying Living Expenses: Not on file  Food Insecurity:    . Worried About Charity fundraiser in the Last Year: Not on file  . Ran Out of Food in the Last Year: Not on file  Transportation Needs:   . Lack of Transportation (Medical): Not on file  . Lack of Transportation (Non-Medical): Not on file  Physical Activity:   . Days of Exercise per Week: Not on file  . Minutes of Exercise per Session: Not on file  Stress:   . Feeling of Stress : Not on file  Social Connections:   . Frequency of Communication with Friends and Family: Not on file  . Frequency of Social Gatherings with Friends and Family: Not on file  . Attends Religious Services: Not on file  . Active Member of Clubs or Organizations: Not on file  . Attends Archivist Meetings: Not on file  . Marital Status: Not on file  Intimate Partner Violence:   . Fear of Current or Ex-Partner: Not on file  . Emotionally Abused: Not on file  . Physically Abused: Not on file  . Sexually Abused: Not on file    FAMILY HISTORY: Family History  Problem Relation Age of Onset  . Healthy Sister   . Healthy Brother     ALLERGIES:  has No Known Allergies.  MEDICATIONS:  Current Outpatient Medications  Medication Sig Dispense Refill  . acetaminophen (TYLENOL) 500 MG tablet Take 1 tablet (500 mg total) by mouth every 6 (six) hours as needed. 30 tablet 0  . acyclovir (ZOVIRAX) 400 MG tablet Take 1 tablet (400 mg total) by mouth daily. 30 tablet 3  . aspirin EC 81 MG tablet Take 1 tablet (81 mg total) by mouth daily. Swallow whole. 90 tablet 3  . atorvastatin (LIPITOR) 40 MG tablet Take 1 tablet (40 mg total) by mouth daily at 6 PM. 90 tablet 3  . furosemide (LASIX) 20 MG tablet Take 1 tablet (20 mg total) by mouth every other day. 90 tablet 3  . guaiFENesin (MUCINEX) 600 MG 12 hr tablet Take 1 tablet (600 mg total) by mouth 2 (two) times daily as needed. 30 tablet 1  . loperamide (IMODIUM) 2 MG capsule Take 1 capsule (2 mg total) by mouth as needed for diarrhea or loose stools. (Patient  not taking: Reported on 06/15/2020) 60 capsule 1  . Methylcellulose, Laxative, (FIBER THERAPY) 500 MG TABS Take by mouth daily. (Patient not taking: Reported on 07/26/2020)    . midodrine (PROAMATINE) 5 MG tablet Take 1 tablet (5 mg total) by mouth 3 (three) times daily with meals. 90 tablet 6  . Polyethylene Glycol 3350 (MIRALAX PO) Take by mouth.    . potassium chloride SA (KLOR-CON) 20 MEQ tablet Take 2 tablets by mouth for 2 days then hold 30 tablet 0  . spironolactone (ALDACTONE) 25 MG tablet Take 0.5 tablets (12.5 mg total) by mouth daily. 45 tablet 3  . traMADol (ULTRAM) 50 MG tablet Take 1 tablet (50 mg total) by mouth every 6 (six) hours as needed. 30 tablet 0   No current facility-administered medications for this visit.    REVIEW OF SYSTEMS:   Constitutional: ( - ) fevers, ( - )  chills , ( - )  night sweats Eyes: ( - ) blurriness of vision, ( - ) double vision, ( - ) watery eyes Ears, nose, mouth, throat, and face: ( - ) mucositis, ( - ) sore throat Respiratory: ( - ) cough, ( - ) dyspnea, ( - ) wheezes Cardiovascular: ( - ) palpitation, ( - ) chest discomfort, ( - ) lower extremity swelling Gastrointestinal:  ( - ) nausea, ( - ) heartburn, ( - ) change in bowel habits Skin: ( - ) abnormal skin rashes Lymphatics: ( - ) new lymphadenopathy, ( - ) easy bruising Neurological: ( - ) numbness, ( - ) tingling, ( - ) new weaknesses Behavioral/Psych: ( - ) mood change, ( - ) new changes  All other systems were reviewed with the patient and are negative.  PHYSICAL EXAMINATION: ECOG PERFORMANCE STATUS: 1 - Symptomatic but completely ambulatory  Vitals:   07/26/20 0922  BP: 100/68  Pulse: 62  Resp: 18  Temp: (!) 97.2 F (36.2 C)  SpO2: 99%   Filed Weights   07/26/20 0922  Weight: 134 lb 12.8 oz (61.1 kg)    GENERAL: well appearing elderly [place] female. alert, no distress and comfortable SKIN: skin color, texture, turgor are normal, no rashes or significant  lesions EYES: conjunctiva are pink and non-injected, sclera clear LUNGS: clear to auscultation and percussion with normal breathing effort HEART: regular rate & rhythm and no murmurs and no lower extremity edema Musculoskeletal: no cyanosis of digits and no clubbing  PSYCH: alert & oriented x 3, fluent speech NEURO: no focal motor/sensory deficits  LABORATORY DATA:  I have reviewed the data as listed CBC Latest Ref Rng & Units 07/26/2020 07/19/2020 07/12/2020  WBC 4.0 - 10.5 K/uL 4.1 3.3(L) 3.0(L)  Hemoglobin 12.0 - 15.0 g/dL 10.8(L) 10.3(L) 10.0(L)  Hematocrit 36 - 46 % 32.4(L) 30.9(L) 29.4(L)  Platelets 150 - 400 K/uL 106(L) 132(L) 122(L)    CMP Latest Ref Rng & Units 07/26/2020 07/19/2020 07/12/2020  Glucose 70 - 99 mg/dL 119(H) 122(H) 88  BUN 8 - 23 mg/dL $Remove'18 16 19  'vswJtsm$ Creatinine 0.44 - 1.00 mg/dL 1.12(H) 1.31(H) 1.39(H)  Sodium 135 - 145 mmol/L 136 137 137  Potassium 3.5 - 5.1 mmol/L 3.8 3.8 4.0  Chloride 98 - 111 mmol/L 104 106 106  CO2 22 - 32 mmol/L 22 22 20(L)  Calcium 8.9 - 10.3 mg/dL 8.4(L) 8.4(L) 8.4(L)  Total Protein 6.5 - 8.1 g/dL 6.1(L) 5.8(L) 6.0(L)  Total Bilirubin 0.3 - 1.2 mg/dL 2.7(H) 1.7(H) 2.2(H)  Alkaline Phos 38 - 126 U/L 246(H) 257(H) 294(H)  AST 15 - 41 U/L 30 32 33  ALT 0 - 44 U/L $Remo'12 11 13    'tADyG$ Lab Results  Component Value Date   MPROTEIN 1.1 (H) 06/14/2020   MPROTEIN 2.7 (H) 03/13/2020   MPROTEIN 2.4 (H) 03/01/2020   Lab Results  Component Value Date   KPAFRELGTCHN 18.5 06/14/2020   KPAFRELGTCHN 23.8 (H) 03/13/2020   LAMBDASER 16.9 06/14/2020   LAMBDASER 34.5 (H) 03/13/2020   KAPLAMBRATIO 1.09 06/14/2020   KAPLAMBRATIO 7.27 03/23/2020   KAPLAMBRATIO 0.69 03/13/2020     PATHOLOGY: Surgical Pathology  CASE: [id number]  PATIENT: Mikayla Reynolds  Bone Marrow Report   Clinical History: None provided   DIAGNOSIS:   BONE MARROW, ASPIRATE, CLOT, CORE:  - Cellular marrow involved by a plasma cell neoplasm (20%)  - See comment and  microscopic description below   PERIPHERAL BLOOD:  - Pancytopenia  - See complete blood cell count   COMMENT:  The bone marrow biopsy is involved by a plasma cell neoplasm, 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 Correlation with radiographic findings  and laboratory data is required to characterize this plasma cell  neoplasm. There was no amyloid detected on the core biopsy or clot  section; however, the sample was suboptimal for evaluation.   MICROSCOPIC DESCRIPTION:   PERIPHERAL BLOOD SMEAR: The peripheral blood has a macrocytic anemia,  leukopenia and thrombocytopenia. Red cell morphology shows mild  anisocytosis. There is no rouleaux formation. Leukocytes are  morphologically unremarkable. Circulating plasma cellsare not  identified.   BONE MARROW ASPIRATE: Spicular, cellular and adequate for evaluation  Erythroid precursors: Orderly maturation without overt dysplasia  Granulocytic precursors: Orderly maturation without overt dysplasia  Megakaryocytes: Qualitatively and quantitatively unremarkable  Lymphocytes/plasma cells: There are increased plasma cells (16% by  manual differential counts) some are enlarged with multi nucleated forms  present. There is no lymphocytosis.   TOUCH PREPARATIONS: Markedly hypocellular with no additional findings as  compared to the aspirate smear   CLOT AND BIOPSY: Examination of the bone marrow core biopsy and clot  section reveals a variably cellular marrow (30%) with trilineage  hematopoiesis. CD138 highlights clusters of plasma cells (approximately  20% of the total marrow cellularity) which are lambda restricted by  kappa and lambda in situ hybridization. There is a mixed population of  B and T cells by CD20 and CD3 immunohistochemistry respectively. [place]  red performed on the core biopsy and clot section is NEGATIVE.   IRON STAIN: Iron stains are performed on a bone marrow aspirate or touch  imprint smear and section  of clot. The controls stained appropriately.     Storage Iron: Present    Ring Sideroblasts: Not identified   ADDITIONAL DATA/TESTING: Cytogenetics is pending and will be reported  separately.   CELL COUNT DATA:   Bone Marrow count performed on 500 cells shows:  Blasts:  0%  Myeloid: 44%  Promyelocytes: 0%  Erythroid:   28%  Myelocytes:  7%  Lymphocytes:  7%  Metamyelocytes:   0%  Plasma cells: 16%  Bands:  8%  Neutrophils:  23% M:E ratio:   1.57  Eosinophils:  5%  Basophils:   1%  Monocytes:   5%   Lab Data: CBC performed on 03/23/2020 shows:  WBC: 3.5 k/uL Neutrophils:  59%  Hgb: 11.0 g/dL Lymphocytes:  20%  HCT: 33.1 %  Monocytes:   14%  MCV: 105.4 fL Eosinophils:  6%  RDW: 14.6 %  Basophils:   1%  PLT: 139 k/uL   GROSS DESCRIPTION:   A. Bone marrow aspirate smear.   B. Received in B-plus fixative is a 1.2 x 1 x 0.1 cm aggregate of  tan-brown tissue, submitted in 1 cassette.   C. Received in B-plus fixative is a 1.2 cm in length by 0.2 cm diameter  hard tan-brown soft tissue core, submitted in 1 cassette following  decalcification in Immunocal. (AK 03/23/2020)%    Final Diagnosis performed by Thressa Sheller, MD.  Electronically signed  03/28/2020    RADIOGRAPHIC STUDIES: I have personally reviewed the radiological images as listed and agreed with the findings in the report: no evidence of lytic lesions on bone scan.  CUP PACEART REMOTE DEVICE CHECK  Result Date: 07/24/2020 ILR summary report received. Battery status OK. Normal device function. No new symptom, tachy, brady, or pause episodes. No new AF episodes. Monthly summary reports and ROV/PRN. HB   ASSESSMENT & PLAN Mikayla Reynolds 79 y.o. female with medical  history significant for CHF, CKD, HLD, CVA, and DM type II who presents for evaluation of a monoclonal gammopathy. After review of the labs, discussion with the patient, and reviewed the prior records the  patient's findings are concerning for a plasma cell dyscrasia and possible amyloidosis.The patient has cardiac dysfunction with an equivocal myocardial amyloid imaging study performed on 02/28/2020 and a bone marrow biopsy on 03/23/2020 which showed 20% plasma cell population (but no evidence of amyloidosis). Cardiac MRI on 04/19/2020 is highly concerning for amyloid infiltrate. Based on this, we can presumptively start treatment while we await confirmation of amyloid with a tissue sample.   On exam today Mrs. Mounsey is accompanied by her son. Her diarrhea has now been replaced by constipation.  She is having worsening back pain which is limiting her mobility and causing her discomfort.  She denies having any issues with fevers, chills, sweats, nausea, running or diarrhea.  She is willing and able to proceed with chemotherapy.  Labs are stable.  In order to confirm the diagnosis of amyloidosis we will need to obtain further tissue samples. Unfortunately the fat pad biopsy is negative, we need to consider biopsy of the liver versus cardiac biopsy in order to help confirm the diagnosis.  Based on her plasma cells and imaging there is strong supporting evidence to start treatment now while we await these biopsy results.  We require the amyloid protein in order to effectively confirm the diagnosis.   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  As such the treatment of choice would be to target her plasma cell population with a triplet or quadruplet therapy.  Therapy of choice in this case would consist of daratumumab, Velcade, cyclophosphamide, and dexamethasone.  Given the patient's advanced age I would recommend that we start initially with the Velcade, cyclophosphamide, and Dex and then add the daratumumab on if she was shown to tolerate the treatment.  I discussed the side effects of this chemotherapy with the patient including neuropathy, elevated blood  pressure, drop in blood counts, hypersensitivity reaction, chest tightness, increased infection risk, and fatigue.  The patient voiced her understanding of these findings and is agreeable to move forward with triple therapy with the intention of advancing to quadruple therapy if well tolerated.  The regimen of choice will be daratumumab, bortezomib, cyclophosphamide and dexamethasone per the ANDROMEDA Study ( Blood. 2020 Jul 2;136(1):71-80). We will modify this to start with CyBorD with the addition of daratumumab if she is shown to tolerate initial therapy.   Treatment consists of: Cyclophosphamide 300 mg/m2 intravenously and bortezomib 1.3 mg/m2 subcutaneously were given on days 1, 8, 15, and 22 of each 28 day cycle for up to 6 cycles. Dexamethasone 40 mg (starting dose) was given orally or intravenously weekly for each cycle for up to 6 cycles. DARA [place] was administered in a single, premixed vial and given by manual Haskell injection over the course of 3 to 5 minutes weekly in cycles 1 to 2, every 2 weeks in cycles 3 to 6, and every 4 weeks thereafter as monotherapy for a maximum of 2 years.  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 In order to confirm the diagnosis. In order to confirm diagnosis the next best step would be a biopsy of cardiac tissue. This may not be feasible moving forward.  --given the ample evidence of an AL amyloidosis will start treatment while  awaiting final pathological diagnosis. Further delay may result in worsening heart function/decline.  --The patient's elevations in LFTs may also indicate amyloid involvement of the liver (or more likely congestive hepatopathy).  --monoclonal gammopathy reveals no CRAB criteria. 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  -- given the patient's condition she is a good candidate  for systemic treatment, though treatment is on hold due to recent decline in the labs. --restarted chemotherapy on 07/19/2020. Labs stable after last cycle.  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  --counts are improving off chemo on exam today --continue to monitor   No orders of the defined types were placed in this encounter.  All questions were answered. The patient knows to call the clinic with any problems, questions or concerns.  A total of more than 30 minutes were spent on this encounter and over half of that time was spent on counseling and coordination of care as outlined above.   Ledell Peoples, MD Department of Hematology/Oncology [place] [facility] Phone: [phone] Pager: [phone] Email: [email or web address]  07/26/2020 2:39 PM

## 2020-07-26 NOTE — Patient Instructions (Signed)
[facility] Discharge Instructions for Patients Receiving Chemotherapy  Today you received the following chemotherapy agents: bortezomib/cyclophosphamide.  To help prevent nausea and vomiting after your treatment, we encourage you to take your nausea medication as directed.   If you develop nausea and vomiting that is not controlled by your nausea medication, call the clinic.   BELOW ARE SYMPTOMS THAT SHOULD BE REPORTED IMMEDIATELY:  *FEVER GREATER THAN 100.5 F  *CHILLS WITH OR WITHOUT FEVER  NAUSEA AND VOMITING THAT IS NOT CONTROLLED WITH YOUR NAUSEA MEDICATION  *UNUSUAL SHORTNESS OF BREATH  *UNUSUAL BRUISING OR BLEEDING  TENDERNESS IN MOUTH AND THROAT WITH OR WITHOUT PRESENCE OF ULCERS  *URINARY PROBLEMS  *BOWEL PROBLEMS  UNUSUAL RASH Items with * indicate a potential emergency and should be followed up as soon as possible.  Feel free to call the clinic should you have any questions or concerns. The clinic phone number is [phone].  Please show the CHEMO ALERT CARD at check-in to the Emergency Department and triage nurse.   

## 2020-07-26 NOTE — Progress Notes (Signed)
Nutrition Follow-up:  Patient with likely amyloidosis with resulting heart failure.  Patient receiving chemotherapy.    Met with patient and medical interpreter [place].  Patient reports that appetite is normal.  Diarrhea has resolved.  Typically has been drinking 2 ensure per day.  Breakfast is usually traditional noodles and ensure.  Lunch is rich and fish and dinner is cookie with ensure   Medications: reviewed  Labs: reviewed  Anthropometrics:   Weight is 134 lb 12.8 oz today with swelling in both legs and ankles.  Increased from 129 lb on 11/10 120 lb on 10/27 10/20 124   NUTRITION DIAGNOSIS: Unintentional weight loss unable to determine with fluid accumulation    INTERVENTION:  Discussed protein rich foods in diet to consume.   Another complimentary case of ensure plus given to patient today.  Continue 2-3 per day.      MONITORING, EVALUATION, GOAL: weight trends, intake   NEXT VISIT: to be determined with treatment  Mikayla Reynolds, [place], [place] Registered Dietitian [phone] (mobile)

## 2020-07-26 NOTE — Progress Notes (Signed)
Per Dr Lorenso Courier, ok to treat with elevated t bili

## 2020-07-27 LAB — BETA 2 MICROGLOBULIN, SERUM: Beta-2 Microglobulin: 4.4 mg/L — ABNORMAL HIGH (ref 0.6–2.4)

## 2020-07-28 ENCOUNTER — Telehealth: Payer: Self-pay | Admitting: Hematology and Oncology

## 2020-07-28 LAB — KAPPA/LAMBDA LIGHT CHAINS
Kappa free light chain: 29 mg/L — ABNORMAL HIGH (ref 3.3–19.4)
Kappa, lambda light chain ratio: 1.34 (ref 0.26–1.65)
Lambda free light chains: 21.6 mg/L (ref 5.7–26.3)

## 2020-07-28 NOTE — Telephone Encounter (Signed)
Scheduled per 11/24 los. Pt will receive an updated appt calendar per next visit appt notes

## 2020-07-31 ENCOUNTER — Other Ambulatory Visit: Payer: Self-pay

## 2020-07-31 ENCOUNTER — Ambulatory Visit: Payer: Medicaid Other | Attending: Internal Medicine | Admitting: Internal Medicine

## 2020-07-31 DIAGNOSIS — M5416 Radiculopathy, lumbar region: Secondary | ICD-10-CM | POA: Diagnosis not present

## 2020-07-31 DIAGNOSIS — E8809 Other disorders of plasma-protein metabolism, not elsewhere classified: Secondary | ICD-10-CM

## 2020-07-31 DIAGNOSIS — R1314 Dysphagia, pharyngoesophageal phase: Secondary | ICD-10-CM

## 2020-07-31 NOTE — Progress Notes (Signed)
Virtual Visit via Telephone Note  I connected with Naylin Burkle on 07/31/20 at 11:10 AM EST by telephone and verified that I am speaking with the correct person using two identifiers.  Location: Patient: home Provider: office pt's grand-daughter Thu participated in this visit and interpreted for pt  I discussed the limitations, risks, security and privacy concerns of performing an evaluation and management service by telephone and the availability of in person appointments. I also discussed with the patient that there may be a patient responsible charge related to this service. The patient expressed understanding and agreed to proceed.   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 Loletha Carrow 2020), AL.  Patient last evaluated by me 03/2020.  Since last visit, she has been seeing hematologist oncologist Dr. Lorenso Courier.  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  Fat pad biopsy was also negative.  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  May need tissue biopsy either from the heart or the liver.  However he has started chemotherapy.   Today: Main complaint is lower back pain that has progressed prevents her from moving around Present x 5-6 wks without initiating factors. Pain in midline and both sides. 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 Weakness in both legs LT>RT.  No falls. Some numbness in hands and legs but was told it may be related to chemo. Some swelling in legs that started after chemo started about 3 mths ago Reports urine frequency but no incontinence of urine.  +Diarrhea for 2-3 days after each chem session.  Had x-rays of  thoracic and lumbar spine through her oncologist.  X-ray of the thoracic spine revealed diffuse bone demineralization but no acute displaced fractures.  X-ray of the lumbar spine revealed mild degenerative change without acute abnormality.  Given Tramadol 50 mg Q6 hrs which helps some. Rates pain 9/10  She reports having some swelling in the legs.  No shortness of breath.  She has not been taking spironolactone or furosemide.  She thinks that the cardiologist discontinued both.  She reports poor appetite especially with chemotherapy.  However she has noted problems swallowing solid foods and feeling that she chokes on solids.  Observations/Objective:   Chemistry      Component Value Date/Time   NA 136 07/26/2020 0844   NA 136 02/21/2020 1505   K 3.8 07/26/2020 0844   CL 104 07/26/2020 0844   CO2 22 07/26/2020 0844   BUN 18 07/26/2020 0844   BUN 22 02/21/2020 1505   CREATININE 1.12 (H) 07/26/2020 0844      Component Value Date/Time   CALCIUM 8.4 (L) 07/26/2020 0844   ALKPHOS 246 (H) 07/26/2020 0844   AST 30 07/26/2020 0844   ALT 12 07/26/2020 0844   BILITOT 2.7 (H) 07/26/2020 0844     Lab Results  Component Value Date   WBC 4.1 07/26/2020   HGB 10.8 (L) 07/26/2020   HCT 32.4 (L) 07/26/2020   MCV 105.2 (H) 07/26/2020   PLT 106 (L) 07/26/2020     Assessment and Plan: 1. Pain, radicular, lumbar -Patient with complaint of worsening lower back pain with some radicular features and limiting her ability to be ambulatory.  We will proceed with MRI. - MR Lumbar Spine Wo Contrast; Future  2. Pharyngoesophageal dysphagia Symptoms may be related to chemotherapy but she clearly describes dysphagia to solids.  We will get a barium swallow. - DG ESOPHAGUS W SINGLE CM (SOL OR THIN BA); Future  3. Plasma cell dyscrasia Followed by oncology and cardiology.  Currently undergoing chemotherapy   Follow Up Instructions: 6 wks   I discussed the assessment and treatment plan with the  patient. The patient was provided an opportunity to ask questions and all were answered. The patient agreed with the plan and demonstrated an understanding of the instructions.   The patient was advised to call back or seek an in-person evaluation if the symptoms worsen or if the condition fails to improve as anticipated.  I provided 22 minutes of non-face-to-face time during this encounter.   Karle Plumber, MD

## 2020-08-01 ENCOUNTER — Telehealth: Payer: Self-pay | Admitting: *Deleted

## 2020-08-01 NOTE — Telephone Encounter (Signed)
Received call from [facility] lab stating that urine was received from pt who is gone now & it was a small amt so she is questioning whether this is a 24 hour collection.  Called & spoke to pt's granddaughter who called pt's dad & determined this was a one time urine.  Informed that this will probably need to be recollected. Message to Dr Lorenso Courier.  Pt returns tomorrow for treatment.  Granddaughter states that pt was unable to collect last time b/c she had diarrhea after her treatment.  Informed that she would need to collect when she wasn't having diarrhea.

## 2020-08-02 ENCOUNTER — Inpatient Hospital Stay: Payer: Medicaid Other | Attending: Hematology and Oncology

## 2020-08-02 ENCOUNTER — Ambulatory Visit: Payer: Medicaid Other

## 2020-08-02 ENCOUNTER — Other Ambulatory Visit: Payer: Self-pay

## 2020-08-02 ENCOUNTER — Inpatient Hospital Stay: Payer: Medicaid Other

## 2020-08-02 ENCOUNTER — Other Ambulatory Visit: Payer: Medicaid Other

## 2020-08-02 VITALS — BP 106/68 | HR 64 | Temp 98.1°F | Resp 18

## 2020-08-02 DIAGNOSIS — D61818 Other pancytopenia: Secondary | ICD-10-CM | POA: Insufficient documentation

## 2020-08-02 DIAGNOSIS — I110 Hypertensive heart disease with heart failure: Secondary | ICD-10-CM | POA: Diagnosis not present

## 2020-08-02 DIAGNOSIS — I43 Cardiomyopathy in diseases classified elsewhere: Secondary | ICD-10-CM

## 2020-08-02 DIAGNOSIS — N183 Chronic kidney disease, stage 3 unspecified: Secondary | ICD-10-CM | POA: Insufficient documentation

## 2020-08-02 DIAGNOSIS — Z79899 Other long term (current) drug therapy: Secondary | ICD-10-CM | POA: Diagnosis not present

## 2020-08-02 DIAGNOSIS — Z5112 Encounter for antineoplastic immunotherapy: Secondary | ICD-10-CM | POA: Insufficient documentation

## 2020-08-02 DIAGNOSIS — Z7982 Long term (current) use of aspirin: Secondary | ICD-10-CM | POA: Diagnosis not present

## 2020-08-02 DIAGNOSIS — E119 Type 2 diabetes mellitus without complications: Secondary | ICD-10-CM | POA: Diagnosis not present

## 2020-08-02 DIAGNOSIS — E8581 Light chain (AL) amyloidosis: Secondary | ICD-10-CM | POA: Diagnosis not present

## 2020-08-02 DIAGNOSIS — Z8673 Personal history of transient ischemic attack (TIA), and cerebral infarction without residual deficits: Secondary | ICD-10-CM | POA: Diagnosis not present

## 2020-08-02 DIAGNOSIS — Z9049 Acquired absence of other specified parts of digestive tract: Secondary | ICD-10-CM | POA: Insufficient documentation

## 2020-08-02 DIAGNOSIS — I5042 Chronic combined systolic (congestive) and diastolic (congestive) heart failure: Secondary | ICD-10-CM | POA: Diagnosis not present

## 2020-08-02 DIAGNOSIS — E785 Hyperlipidemia, unspecified: Secondary | ICD-10-CM | POA: Insufficient documentation

## 2020-08-02 DIAGNOSIS — E854 Organ-limited amyloidosis: Secondary | ICD-10-CM

## 2020-08-02 DIAGNOSIS — Z5111 Encounter for antineoplastic chemotherapy: Secondary | ICD-10-CM | POA: Diagnosis not present

## 2020-08-02 LAB — CBC WITH DIFFERENTIAL (CANCER CENTER ONLY)
Abs Immature Granulocytes: 0.02 10*3/uL (ref 0.00–0.07)
Basophils Absolute: 0 10*3/uL (ref 0.0–0.1)
Basophils Relative: 1 %
Eosinophils Absolute: 0.1 10*3/uL (ref 0.0–0.5)
Eosinophils Relative: 2 %
HCT: 29.8 % — ABNORMAL LOW (ref 36.0–46.0)
Hemoglobin: 10.2 g/dL — ABNORMAL LOW (ref 12.0–15.0)
Immature Granulocytes: 1 %
Lymphocytes Relative: 5 %
Lymphs Abs: 0.2 10*3/uL — ABNORMAL LOW (ref 0.7–4.0)
MCH: 35.4 pg — ABNORMAL HIGH (ref 26.0–34.0)
MCHC: 34.2 g/dL (ref 30.0–36.0)
MCV: 103.5 fL — ABNORMAL HIGH (ref 80.0–100.0)
Monocytes Absolute: 0.8 10*3/uL (ref 0.1–1.0)
Monocytes Relative: 22 %
Neutro Abs: 2.5 10*3/uL (ref 1.7–7.7)
Neutrophils Relative %: 69 %
Platelet Count: 98 10*3/uL — ABNORMAL LOW (ref 150–400)
RBC: 2.88 MIL/uL — ABNORMAL LOW (ref 3.87–5.11)
RDW: 16 % — ABNORMAL HIGH (ref 11.5–15.5)
WBC Count: 3.6 10*3/uL — ABNORMAL LOW (ref 4.0–10.5)
nRBC: 0.8 % — ABNORMAL HIGH (ref 0.0–0.2)

## 2020-08-02 LAB — CMP (CANCER CENTER ONLY)
ALT: 15 U/L (ref 0–44)
AST: 35 U/L (ref 15–41)
Albumin: 3 g/dL — ABNORMAL LOW (ref 3.5–5.0)
Alkaline Phosphatase: 232 U/L — ABNORMAL HIGH (ref 38–126)
Anion gap: 11 (ref 5–15)
BUN: 16 mg/dL (ref 8–23)
CO2: 24 mmol/L (ref 22–32)
Calcium: 8.5 mg/dL — ABNORMAL LOW (ref 8.9–10.3)
Chloride: 101 mmol/L (ref 98–111)
Creatinine: 1.14 mg/dL — ABNORMAL HIGH (ref 0.44–1.00)
GFR, Estimated: 49 mL/min — ABNORMAL LOW (ref >60)
Glucose, Bld: 102 mg/dL — ABNORMAL HIGH (ref 70–99)
Potassium: 3.6 mmol/L (ref 3.5–5.1)
Sodium: 136 mmol/L (ref 135–145)
Total Bilirubin: 2.3 mg/dL — ABNORMAL HIGH (ref 0.3–1.2)
Total Protein: 6.1 g/dL — ABNORMAL LOW (ref 6.5–8.1)

## 2020-08-02 MED ORDER — BORTEZOMIB CHEMO SQ INJECTION 3.5 MG (2.5MG/ML)
1.5000 mg/m2 | Freq: Once | INTRAMUSCULAR | Status: AC
  Administered 2020-08-02: 2.25 mg via SUBCUTANEOUS
  Filled 2020-08-02: qty 0.9

## 2020-08-02 MED ORDER — PALONOSETRON HCL INJECTION 0.25 MG/5ML
INTRAVENOUS | Status: AC
  Filled 2020-08-02: qty 5

## 2020-08-02 MED ORDER — SODIUM CHLORIDE 0.9 % IV SOLN
Freq: Once | INTRAVENOUS | Status: AC
  Filled 2020-08-02: qty 250

## 2020-08-02 MED ORDER — SODIUM CHLORIDE 0.9 % IV SOLN
225.0000 mg/m2 | Freq: Once | INTRAVENOUS | Status: AC
  Administered 2020-08-02: 340 mg via INTRAVENOUS
  Filled 2020-08-02: qty 17

## 2020-08-02 MED ORDER — SODIUM CHLORIDE 0.9 % IV SOLN
40.0000 mg | Freq: Once | INTRAVENOUS | Status: AC
  Administered 2020-08-02: 40 mg via INTRAVENOUS
  Filled 2020-08-02: qty 4

## 2020-08-02 MED ORDER — PALONOSETRON HCL INJECTION 0.25 MG/5ML
0.2500 mg | Freq: Once | INTRAVENOUS | Status: AC
  Administered 2020-08-02: 0.25 mg via INTRAVENOUS

## 2020-08-02 NOTE — Patient Instructions (Signed)
[facility] Discharge Instructions for Patients Receiving Chemotherapy  Today you received the following chemotherapy agents: bortezomib/cyclophosphamide.  To help prevent nausea and vomiting after your treatment, we encourage you to take your nausea medication as directed.   If you develop nausea and vomiting that is not controlled by your nausea medication, call the clinic.   BELOW ARE SYMPTOMS THAT SHOULD BE REPORTED IMMEDIATELY:  *FEVER GREATER THAN 100.5 F  *CHILLS WITH OR WITHOUT FEVER  NAUSEA AND VOMITING THAT IS NOT CONTROLLED WITH YOUR NAUSEA MEDICATION  *UNUSUAL SHORTNESS OF BREATH  *UNUSUAL BRUISING OR BLEEDING  TENDERNESS IN MOUTH AND THROAT WITH OR WITHOUT PRESENCE OF ULCERS  *URINARY PROBLEMS  *BOWEL PROBLEMS  UNUSUAL RASH Items with * indicate a potential emergency and should be followed up as soon as possible.  Feel free to call the clinic should you have any questions or concerns. The clinic phone number is [phone].  Please show the CHEMO ALERT CARD at check-in to the Emergency Department and triage nurse.   

## 2020-08-02 NOTE — Progress Notes (Signed)
Ok to treat today despite all abnormal labs per MD Lorenso Courier.  Pt stable for d/c, w/c assist with belongings, son taking her home.

## 2020-08-03 ENCOUNTER — Telehealth: Payer: Self-pay | Admitting: Internal Medicine

## 2020-08-03 LAB — MULTIPLE MYELOMA PANEL, SERUM
Albumin SerPl Elph-Mcnc: 3 g/dL (ref 2.9–4.4)
Albumin/Glob SerPl: 1.2 (ref 0.7–1.7)
Alpha 1: 0.2 g/dL (ref 0.0–0.4)
Alpha2 Glob SerPl Elph-Mcnc: 0.6 g/dL (ref 0.4–1.0)
B-Globulin SerPl Elph-Mcnc: 0.7 g/dL (ref 0.7–1.3)
Gamma Glob SerPl Elph-Mcnc: 1.1 g/dL (ref 0.4–1.8)
Globulin, Total: 2.6 g/dL (ref 2.2–3.9)
IgA: 29 mg/dL — ABNORMAL LOW (ref 64–422)
IgG (Immunoglobin G), Serum: 1299 mg/dL (ref 586–1602)
IgM (Immunoglobulin M), Srm: 85 mg/dL (ref 26–217)
M Protein SerPl Elph-Mcnc: 0.6 g/dL — ABNORMAL HIGH
Total Protein ELP: 5.6 g/dL — ABNORMAL LOW (ref 6.0–8.5)

## 2020-08-03 NOTE — Telephone Encounter (Signed)
-----   Message from Orson Slick, MD sent at 08/03/2020 12:54 PM EST ----- If I recall she was having severe diarrhea earlier in November. She received IV fluids and we asked her to hold the diuretics for a few days. I think there may have been a miscommunication and she did not restart the medications. I agree that we need to restart the diuretics.    ----- Message ----- From: Jolaine Artist, MD Sent: 08/03/2020  11:58 AM EST To: Ladell Pier, MD, Orson Slick, MD  Hey. Who stopped the lasix and spiro? She needs diuretics ----- Message ----- From: Ladell Pier, MD Sent: 08/02/2020   1:09 PM EST To: Jolaine Artist, MD, Orson Slick, MD  Dr. Haroldine Laws:  I spoke with pt and her grand-daughter via phone 2 days ago.  She was still complaining of a lot of lower back pain so I ordered an MRI of lumbar spine.  She also c/o swelling in the legs and thinks she was told to discontinue Spironolactone and Furosemide.  I told her I will very with you.

## 2020-08-04 NOTE — Telephone Encounter (Signed)
Contacted pt and left a detailed vm infroming pt of Dr. Wynetta Emery message and they have any questions or concerns to give [place] a call

## 2020-08-09 ENCOUNTER — Other Ambulatory Visit: Payer: Medicaid Other

## 2020-08-09 ENCOUNTER — Inpatient Hospital Stay (HOSPITAL_BASED_OUTPATIENT_CLINIC_OR_DEPARTMENT_OTHER): Payer: Medicaid Other | Admitting: Hematology and Oncology

## 2020-08-09 ENCOUNTER — Inpatient Hospital Stay: Payer: Medicaid Other

## 2020-08-09 ENCOUNTER — Ambulatory Visit: Payer: Medicaid Other

## 2020-08-09 ENCOUNTER — Encounter: Payer: Self-pay | Admitting: Hematology and Oncology

## 2020-08-09 ENCOUNTER — Ambulatory Visit: Payer: Medicaid Other | Admitting: Hematology and Oncology

## 2020-08-09 ENCOUNTER — Other Ambulatory Visit: Payer: Self-pay

## 2020-08-09 VITALS — BP 120/82 | HR 73 | Temp 97.9°F | Resp 18 | Ht 60.0 in | Wt 129.9 lb

## 2020-08-09 DIAGNOSIS — Z5112 Encounter for antineoplastic immunotherapy: Secondary | ICD-10-CM | POA: Diagnosis not present

## 2020-08-09 DIAGNOSIS — E854 Organ-limited amyloidosis: Secondary | ICD-10-CM

## 2020-08-09 DIAGNOSIS — I110 Hypertensive heart disease with heart failure: Secondary | ICD-10-CM | POA: Diagnosis not present

## 2020-08-09 DIAGNOSIS — I43 Cardiomyopathy in diseases classified elsewhere: Secondary | ICD-10-CM

## 2020-08-09 DIAGNOSIS — E8581 Light chain (AL) amyloidosis: Secondary | ICD-10-CM | POA: Diagnosis not present

## 2020-08-09 DIAGNOSIS — D61818 Other pancytopenia: Secondary | ICD-10-CM | POA: Diagnosis not present

## 2020-08-09 DIAGNOSIS — I5042 Chronic combined systolic (congestive) and diastolic (congestive) heart failure: Secondary | ICD-10-CM | POA: Diagnosis not present

## 2020-08-09 DIAGNOSIS — Z7982 Long term (current) use of aspirin: Secondary | ICD-10-CM | POA: Diagnosis not present

## 2020-08-09 DIAGNOSIS — Z5111 Encounter for antineoplastic chemotherapy: Secondary | ICD-10-CM | POA: Diagnosis not present

## 2020-08-09 DIAGNOSIS — Z9049 Acquired absence of other specified parts of digestive tract: Secondary | ICD-10-CM | POA: Diagnosis not present

## 2020-08-09 DIAGNOSIS — E785 Hyperlipidemia, unspecified: Secondary | ICD-10-CM | POA: Diagnosis not present

## 2020-08-09 DIAGNOSIS — N183 Chronic kidney disease, stage 3 unspecified: Secondary | ICD-10-CM | POA: Diagnosis not present

## 2020-08-09 DIAGNOSIS — E119 Type 2 diabetes mellitus without complications: Secondary | ICD-10-CM | POA: Diagnosis not present

## 2020-08-09 DIAGNOSIS — Z79899 Other long term (current) drug therapy: Secondary | ICD-10-CM | POA: Diagnosis not present

## 2020-08-09 DIAGNOSIS — Z8673 Personal history of transient ischemic attack (TIA), and cerebral infarction without residual deficits: Secondary | ICD-10-CM | POA: Diagnosis not present

## 2020-08-09 DIAGNOSIS — R197 Diarrhea, unspecified: Secondary | ICD-10-CM

## 2020-08-09 LAB — CMP (CANCER CENTER ONLY)
ALT: 20 U/L (ref 0–44)
AST: 49 U/L — ABNORMAL HIGH (ref 15–41)
Albumin: 3.1 g/dL — ABNORMAL LOW (ref 3.5–5.0)
Alkaline Phosphatase: 277 U/L — ABNORMAL HIGH (ref 38–126)
Anion gap: 12 (ref 5–15)
BUN: 19 mg/dL (ref 8–23)
CO2: 24 mmol/L (ref 22–32)
Calcium: 8.6 mg/dL — ABNORMAL LOW (ref 8.9–10.3)
Chloride: 102 mmol/L (ref 98–111)
Creatinine: 1.06 mg/dL — ABNORMAL HIGH (ref 0.44–1.00)
GFR, Estimated: 54 mL/min — ABNORMAL LOW (ref >60)
Glucose, Bld: 96 mg/dL (ref 70–99)
Potassium: 3 mmol/L — ABNORMAL LOW (ref 3.5–5.1)
Sodium: 138 mmol/L (ref 135–145)
Total Bilirubin: 3.2 mg/dL — ABNORMAL HIGH (ref 0.3–1.2)
Total Protein: 6.2 g/dL — ABNORMAL LOW (ref 6.5–8.1)

## 2020-08-09 LAB — CBC WITH DIFFERENTIAL (CANCER CENTER ONLY)
Abs Immature Granulocytes: 0.06 10*3/uL (ref 0.00–0.07)
Basophils Absolute: 0 10*3/uL (ref 0.0–0.1)
Basophils Relative: 1 %
Eosinophils Absolute: 0 10*3/uL (ref 0.0–0.5)
Eosinophils Relative: 0 %
HCT: 30.7 % — ABNORMAL LOW (ref 36.0–46.0)
Hemoglobin: 10.5 g/dL — ABNORMAL LOW (ref 12.0–15.0)
Immature Granulocytes: 1 %
Lymphocytes Relative: 2 %
Lymphs Abs: 0.1 10*3/uL — ABNORMAL LOW (ref 0.7–4.0)
MCH: 35.8 pg — ABNORMAL HIGH (ref 26.0–34.0)
MCHC: 34.2 g/dL (ref 30.0–36.0)
MCV: 104.8 fL — ABNORMAL HIGH (ref 80.0–100.0)
Monocytes Absolute: 0.8 10*3/uL (ref 0.1–1.0)
Monocytes Relative: 19 %
Neutro Abs: 3.4 10*3/uL (ref 1.7–7.7)
Neutrophils Relative %: 77 %
Platelet Count: 112 10*3/uL — ABNORMAL LOW (ref 150–400)
RBC: 2.93 MIL/uL — ABNORMAL LOW (ref 3.87–5.11)
RDW: 17.1 % — ABNORMAL HIGH (ref 11.5–15.5)
WBC Count: 4.5 10*3/uL (ref 4.0–10.5)
nRBC: 0.4 % — ABNORMAL HIGH (ref 0.0–0.2)

## 2020-08-09 MED ORDER — OXYCODONE HCL 5 MG PO TABS
5.0000 mg | ORAL_TABLET | ORAL | 0 refills | Status: AC | PRN
Start: 2020-08-09 — End: ?

## 2020-08-09 MED ORDER — FUROSEMIDE 20 MG PO TABS: 20.0000 mg | ORAL_TABLET | ORAL | 3 refills | Status: AC

## 2020-08-09 MED ORDER — SPIRONOLACTONE 25 MG PO TABS: 12.5000 mg | ORAL_TABLET | Freq: Every day | ORAL | 3 refills | Status: AC

## 2020-08-09 MED ORDER — DIPHENOXYLATE-ATROPINE 2.5-0.025 MG PO TABS: 1.0000 | ORAL_TABLET | Freq: Four times a day (QID) | ORAL | 0 refills | Status: AC | PRN

## 2020-08-09 NOTE — Progress Notes (Signed)
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 Ordered during evaluation for cardiomyopathy.  2) 03/13/2020: establish care with Dr. 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  8) 07/12/2020: re-evaluated, continued having diarrhea. Labs improving.  9) 07/19/2020: re-start CyBorD chemotherapy 10) 08/09/2020: HOLD CyBorD chemotherapy in setting of worsening LFTs, severe back pain and diarrhea.   Interval History:  Mikayla Reynolds 79 y.o. 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 The patient's last visit was on 07/26/2020. In the interim since the last visit she has continued CyBorD chemotherapy.   On exam today communication with the patient is assisted by an approved hospital [place] interpreter. [name] is accompanied by her son.  She reports he has begun having severe diarrhea again.  Is gone to the point where she has to wear an adult diaper in order to help control this.  She also notes that the pain in her back has  markedly worsened to the point where there is no position where she can get comfortable.  She is visibly distressed on exam today with her head in her hands and unable to get comfortable in the chair.  She notes that the tramadol and the Tylenol therapies are not helping with her pain.  She denies having any issues with shortness of breath or cough her weight has declined approximately 5 pounds since her last visit and she is not eating particularly well.  She denies any fevers, chills, sweats, nausea, or vomiting.  A full 10 point ROS is listed below.  MEDICAL HISTORY:  Past Medical History:  Diagnosis Date  . Abnormal liver function   . Anemia, unspecified   . Chronic combined systolic and diastolic CHF (congestive heart failure) ([place])   . CKD (chronic kidney disease), stage III ([place]) 02/19/2018  . Elevated troponin 12/27/2017  . Hyperlipidemia   . Junctional bradycardia   . Mitral regurgitation   . Pre-diabetes   . Renal mass   . Stroke [facility])     SURGICAL HISTORY: Past Surgical History:  Procedure Laterality Date  . abdominal wall surgery  2017  . APPENDECTOMY    . LOOP RECORDER INSERTION N/A 06/23/2018   Procedure: LOOP RECORDER INSERTION;  Surgeon: [name], MD;  Location: [place] CV LAB;  Service: Cardiovascular;  Laterality: N/A;  . TEE WITHOUT CARDIOVERSION N/A 06/23/2018   Procedure: TRANSESOPHAGEAL ECHOCARDIOGRAM (TEE);  Surgeon: [name], MD;  Location: [facility] ENDOSCOPY;  Service: Cardiovascular;  Laterality: N/A;    SOCIAL HISTORY: Social History   Socioeconomic History  . Marital status: Widowed    Spouse name: Not on file  . Number of children: Not on file  . Years of education: Not on file  . Highest  education level: Not on file  Occupational History  . Not on file  Tobacco Use  . Smoking status: Never Smoker  . Smokeless tobacco: Never Used  Vaping Use  . Vaping Use: Never used  Substance and Sexual Activity  . Alcohol use: Not Currently  .  Drug use: Not Currently  . Sexual activity: Not Currently  Other Topics Concern  . Not on file  Social History Narrative  . Not on file   Social Determinants of Health   Financial Resource Strain:   . Difficulty of Paying Living Expenses: Not on file  Food Insecurity:   . Worried About Charity fundraiser in the Last Year: Not on file  . Ran Out of Food in the Last Year: Not on file  Transportation Needs:   . Lack of Transportation (Medical): Not on file  . Lack of Transportation (Non-Medical): Not on file  Physical Activity:   . Days of Exercise per Week: Not on file  . Minutes of Exercise per Session: Not on file  Stress:   . Feeling of Stress : Not on file  Social Connections:   . Frequency of Communication with Friends and Family: Not on file  . Frequency of Social Gatherings with Friends and Family: Not on file  . Attends Religious Services: Not on file  . Active Member of Clubs or Organizations: Not on file  . Attends Archivist Meetings: Not on file  . Marital Status: Not on file  Intimate Partner Violence:   . Fear of Current or Ex-Partner: Not on file  . Emotionally Abused: Not on file  . Physically Abused: Not on file  . Sexually Abused: Not on file    FAMILY HISTORY: Family History  Problem Relation Age of Onset  . Healthy Sister   . Healthy Brother     ALLERGIES:  has No Known Allergies.  MEDICATIONS:  Current Outpatient Medications  Medication Sig Dispense Refill  . loperamide (IMODIUM) 2 MG capsule Take 1 capsule (2 mg total) by mouth as needed for diarrhea or loose stools. 60 capsule 1  . acetaminophen (TYLENOL) 500 MG tablet Take 1 tablet (500 mg total) by mouth every 6 (six) hours as needed. 30 tablet 0  . acyclovir (ZOVIRAX) 400 MG tablet Take 1 tablet (400 mg total) by mouth daily. 30 tablet 3  . aspirin EC 81 MG tablet Take 1 tablet (81 mg total) by mouth daily. Swallow whole. 90 tablet 3  . atorvastatin (LIPITOR) 40 MG tablet Take 1  tablet (40 mg total) by mouth daily at 6 PM. 90 tablet 3  . furosemide (LASIX) 20 MG tablet Take 1 tablet (20 mg total) by mouth every other day. 90 tablet 3  . guaiFENesin (MUCINEX) 600 MG 12 hr tablet Take 1 tablet (600 mg total) by mouth 2 (two) times daily as needed. (Patient not taking: Reported on 07/31/2020) 30 tablet 1  . Methylcellulose, Laxative, (FIBER THERAPY) 500 MG TABS Take by mouth daily. (Patient not taking: Reported on 07/26/2020)    . midodrine (PROAMATINE) 5 MG tablet Take 1 tablet (5 mg total) by mouth 3 (three) times daily with meals. 90 tablet 6  . Polyethylene Glycol 3350 (MIRALAX PO) Take by mouth.    . potassium chloride SA (KLOR-CON) 20 MEQ tablet Take 2 tablets by mouth for 2 days then hold 30 tablet 0  . spironolactone (ALDACTONE) 25 MG tablet Take 0.5 tablets (12.5 mg total) by mouth daily. 45 tablet 3  .  traMADol (ULTRAM) 50 MG tablet Take 1 tablet (50 mg total) by mouth every 6 (six) hours as needed. 30 tablet 0   No current facility-administered medications for this visit.    REVIEW OF SYSTEMS:   Constitutional: ( - ) fevers, ( - )  chills , ( - ) night sweats Eyes: ( - ) blurriness of vision, ( - ) double vision, ( - ) watery eyes Ears, nose, mouth, throat, and face: ( - ) mucositis, ( - ) sore throat Respiratory: ( - ) cough, ( - ) dyspnea, ( - ) wheezes Cardiovascular: ( - ) palpitation, ( - ) chest discomfort, ( - ) lower extremity swelling Gastrointestinal:  ( - ) nausea, ( - ) heartburn, ( - ) change in bowel habits Skin: ( - ) abnormal skin rashes Lymphatics: ( - ) new lymphadenopathy, ( - ) easy bruising Neurological: ( - ) numbness, ( - ) tingling, ( - ) new weaknesses Behavioral/Psych: ( - ) mood change, ( - ) new changes  All other systems were reviewed with the patient and are negative.  PHYSICAL EXAMINATION: ECOG PERFORMANCE STATUS: 1 - Symptomatic but completely ambulatory  Vitals:   08/09/20 1504  BP: 120/82  Pulse: 73  Resp: 18  Temp:  97.9 F (36.6 C)  SpO2: 99%   Filed Weights   08/09/20 1504  Weight: 129 lb 14.4 oz (58.9 kg)    GENERAL: well appearing elderly [place] female. alert, no distress and comfortable SKIN: skin color, texture, turgor are normal, no rashes or significant lesions EYES: conjunctiva are pink and non-injected, sclera clear LUNGS: mild crackles at lung base, otherwise clear to auscultation and percussion with normal breathing effort HEART: regular rate & rhythm and no murmurs and +3 lower extremity edema Musculoskeletal: no cyanosis of digits and no clubbing  PSYCH: alert & oriented x 3, fluent speech NEURO: no focal motor/sensory deficits  LABORATORY DATA:  I have reviewed the data as listed CBC Latest Ref Rng & Units 08/09/2020 08/02/2020 07/26/2020  WBC 4.0 - 10.5 K/uL 4.5 3.6(L) 4.1  Hemoglobin 12.0 - 15.0 g/dL 10.5(L) 10.2(L) 10.8(L)  Hematocrit 36 - 46 % 30.7(L) 29.8(L) 32.4(L)  Platelets 150 - 400 K/uL 112(L) 98(L) 106(L)    CMP Latest Ref Rng & Units 08/09/2020 08/02/2020 07/26/2020  Glucose 70 - 99 mg/dL 96 102(H) 119(H)  BUN 8 - 23 mg/dL _0 Creatinine 0.44 - 1.00 mg/dL 1.06(H) 1.14(H) 1.12(H)  Sodium 135 - 145 mmol/L 138 136 136  Potassium 3.5 - 5.1 mmol/L 3.0(L) 3.6 3.8  Chloride 98 - 111 mmol/L 102 101 104  CO2 22 - 32 mmol/L _1 Calcium 8.9 - 10.3 mg/dL 8.6(L) 8.5(L) 8.4(L)  Total Protein 6.5 - 8.1 g/dL 6.2(L) 6.1(L) 6.1(L)  Total Bilirubin 0.3 - 1.2 mg/dL 3.2(H) 2.3(H) 2.7(H)  Alkaline Phos 38 - 126 U/L 277(H) 232(H) 246(H)  AST 15 - 41 U/L 49(H) 35 30  ALT 0 - 44 U/L _2 Lab Results  Component Value Date   MPROTEIN 0.6 (H) 07/26/2020   MPROTEIN 1.1 (H) 06/14/2020   MPROTEIN 2.7 (H) 03/13/2020   Lab Results  Component Value Date   KPAFRELGTCHN 29.0 (H) 07/26/2020   KPAFRELGTCHN 18.5 06/14/2020   KPAFRELGTCHN 23.8 (H) 03/13/2020   LAMBDASER 21.6 07/26/2020   LAMBDASER 16.9 06/14/2020   LAMBDASER 34.5 (H) 03/13/2020   KAPLAMBRATIO 1.34  07/26/2020   KAPLAMBRATIO 1.09 06/14/2020   KAPLAMBRATIO 7.27 03/23/2020  PATHOLOGY: Surgical Pathology  CASE: [phone]  PATIENT: Shruti Stefanick  Bone Marrow Report   Clinical History: None provided   DIAGNOSIS:   BONE MARROW, ASPIRATE, CLOT, CORE:  - Cellular marrow involved by a plasma cell neoplasm (20%)  - See comment and microscopic description below   PERIPHERAL BLOOD:  - Pancytopenia  - See complete blood cell count   COMMENT:   The bone marrow biopsy is involved by a plasma cell neoplasm, 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 Correlation with radiographic findings  and laboratory data is required to characterize this plasma cell  neoplasm. There was no amyloid detected on the core biopsy or clot  section; however, the sample was suboptimal for evaluation.   MICROSCOPIC DESCRIPTION:   PERIPHERAL BLOOD SMEAR: The peripheral blood has a macrocytic anemia,  leukopenia and thrombocytopenia. Red cell morphology shows mild  anisocytosis. There is no rouleaux formation. Leukocytes are  morphologically unremarkable. Circulating plasma cellsare not  identified.   BONE MARROW ASPIRATE: Spicular, cellular and adequate for evaluation  Erythroid precursors: Orderly maturation without overt dysplasia  Granulocytic precursors: Orderly maturation without overt dysplasia  Megakaryocytes: Qualitatively and quantitatively unremarkable  Lymphocytes/plasma cells: There are increased plasma cells (16% by  manual differential counts) some are enlarged with multi nucleated forms  present. There is no lymphocytosis.   TOUCH PREPARATIONS: Markedly hypocellular with no additional findings as  compared to the aspirate smear   CLOT AND BIOPSY: Examination of the bone marrow core biopsy and clot  section reveals a variably cellular marrow (30%) with trilineage  hematopoiesis. CD138 highlights clusters of plasma cells (approximately  20% of the total marrow  cellularity) which are lambda restricted by  kappa and lambda in situ hybridization. There is a mixed population of  B and T cells by CD20 and CD3 immunohistochemistry respectively. [place]  red performed on the core biopsy and clot section is NEGATIVE.   IRON STAIN: Iron stains are performed on a bone marrow aspirate or touch  imprint smear and section of clot. The controls stained appropriately.     Storage Iron: Present    Ring Sideroblasts: Not identified   ADDITIONAL DATA/TESTING: Cytogenetics is pending and will be reported  separately.   CELL COUNT DATA:   Bone Marrow count performed on 500 cells shows:  Blasts:  0%  Myeloid: 44%  Promyelocytes: 0%  Erythroid:   28%  Myelocytes:  7%  Lymphocytes:  7%  Metamyelocytes:   0%  Plasma cells: 16%  Bands:  8%  Neutrophils:  23% M:E ratio:   1.57  Eosinophils:  5%  Basophils:   1%  Monocytes:   5%   Lab Data: CBC performed on 03/23/2020 shows:  WBC: 3.5 k/uL Neutrophils:  59%  Hgb: 11.0 g/dL Lymphocytes:  20%  HCT: 33.1 %  Monocytes:   14%  MCV: 105.4 fL Eosinophils:  6%  RDW: 14.6 %  Basophils:   1%  PLT: 139 k/uL   GROSS DESCRIPTION:   A. Bone marrow aspirate smear.   B. Received in B-plus fixative is a 1.2 x 1 x 0.1 cm aggregate of  tan-brown tissue, submitted in 1 cassette.   C. Received in B-plus fixative is a 1.2 cm in length by 0.2 cm diameter  hard tan-brown soft tissue core, submitted in 1 cassette following  decalcification in Immunocal. (AK 03/23/2020)%    Final Diagnosis performed by Thressa Sheller, MD.  Electronically signed  03/28/2020    RADIOGRAPHIC STUDIES: I have personally reviewed the radiological images  as listed and agreed with the findings in the report: no evidence of lytic lesions on bone scan.  CUP PACEART REMOTE DEVICE CHECK  Result Date: 07/24/2020 ILR summary report received. Battery status OK. Normal device function. No new symptom, tachy,  brady, or pause episodes. No new AF episodes. Monthly summary reports and ROV/PRN. HB   ASSESSMENT & PLAN Rosellen Bari 79 y.o. female with medical history significant for CHF, CKD, HLD, CVA, and DM type II who presents for evaluation of a monoclonal gammopathy. After review of the labs, discussion with the patient, and reviewed the prior records the patient's findings are concerning for a plasma cell dyscrasia and possible amyloidosis.The patient has cardiac dysfunction with an equivocal myocardial amyloid imaging study performed on 02/28/2020 and a bone marrow biopsy on 03/23/2020 which showed 20% plasma cell population (but no evidence of amyloidosis). Cardiac MRI on 04/19/2020 is highly concerning for amyloid infiltrate. Based on this, we can presumptively start treatment while we await confirmation of amyloid with a tissue sample.   On exam today Mrs. Jezewski is accompanied by her son. Her diarrhea has unfortunately returned and is severe to the point where she requires adult diapers..  She is having considerably worsening back pain which is limiting her mobility and causing her discomfort.  The currently prescribed tramadol and Tylenol not helping with the pain.  She denies having any issues with fevers, chills, sweats, nausea, or vomitting.  Labs show a bump in bilirubin, but kidney function is returning to normal.   In order to confirm the diagnosis of amyloidosis we will need to obtain further tissue samples. Unfortunately the fat pad biopsy is negative, we need to consider biopsy of the liver versus cardiac biopsy in order to help confirm the diagnosis.  Based on her plasma cells and imaging there is strong supporting evidence to start treatment now while we await these biopsy results.  We require the amyloid protein in order to effectively confirm the diagnosis.   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  As such the treatment of choice would be  to target her plasma cell population with a triplet or quadruplet therapy.  Therapy of choice in this case would consist of daratumumab, Velcade, cyclophosphamide, and dexamethasone.  Given the patient's advanced age I would recommend that we start initially with the Velcade, cyclophosphamide, and Dex and then add the daratumumab on if she was shown to tolerate the treatment.  I discussed the side effects of this chemotherapy with the patient including neuropathy, elevated blood pressure, drop in blood counts, hypersensitivity reaction, chest tightness, increased infection risk, and fatigue.  The patient voiced her understanding of these findings and is agreeable to move forward with triple therapy with the intention of advancing to quadruple therapy if well tolerated.  The regimen of choice will be daratumumab, bortezomib, cyclophosphamide and dexamethasone per the ANDROMEDA Study ( Blood. 2020 Jul 2;136(1):71-80). We will modify this to start with CyBorD with the addition of daratumumab if she is shown to tolerate initial therapy.   Treatment consists of: Cyclophosphamide 300 mg/m2 intravenously and bortezomib 1.3 mg/m2 subcutaneously were given on days 1, 8, 15, and 22 of each 28 day cycle for up to 6 cycles. Dexamethasone 40 mg (starting dose) was given orally or intravenously weekly for each cycle for up to 6 cycles. DARA McRoberts was administered in a single, premixed vial and given by manual [place] injection over the course of 3 to 5 minutes  weekly in cycles 1 to 2, every 2 weeks in cycles 3 to 6, and every 4 weeks thereafter as monotherapy for a maximum of 2 years.  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 In order to confirm the diagnosis. In order to confirm diagnosis the next best step would be a biopsy of cardiac tissue. This may not be feasible moving forward.  --given the ample evidence of an AL amyloidosis will start  treatment while awaiting final pathological diagnosis. Further delay may result in worsening heart function/decline.  --The patient's elevations in LFTs may also indicate amyloid involvement of the liver (or more likely congestive hepatopathy).  --monoclonal gammopathy reveals no CRAB criteria. 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  -- given the patient's condition she is a good candidate for systemic treatment, though treatment is on hold due to recent decline in the labs. --restarted chemotherapy on 07/19/2020. Stopped again today 08/09/2020 due to worsening back pain, diarrhea, and lab abnormalities.  --RTC after MRI spine for continued evaluation  #Weight Loss --weight has declined since last visit, is not taking diuretics.  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  --chemo HELD today.  --continue to monitor   No orders of the defined types were placed in this encounter.  All questions were answered. The patient knows to call the clinic with any problems, questions or concerns.  A total of more than 30 minutes were spent on this encounter and over half of that time was spent on counseling and coordination of care as outlined above.   Ledell Peoples, MD Department of Hematology/Oncology [place] [facility] Phone: [phone] Pager: [phone] Email: [email or web address]  08/09/2020 3:28 PM

## 2020-08-10 ENCOUNTER — Ambulatory Visit (HOSPITAL_COMMUNITY)
Admission: RE | Admit: 2020-08-10 | Discharge: 2020-08-10 | Disposition: A | Discharge status: Home / Self Care | Payer: Medicaid Other | Source: Ambulatory Visit | Attending: Internal Medicine | Admitting: Internal Medicine

## 2020-08-10 ENCOUNTER — Other Ambulatory Visit: Payer: Self-pay

## 2020-08-10 DIAGNOSIS — K224 Dyskinesia of esophagus: Secondary | ICD-10-CM | POA: Diagnosis not present

## 2020-08-10 DIAGNOSIS — R1314 Dysphagia, pharyngoesophageal phase: Secondary | ICD-10-CM | POA: Diagnosis not present

## 2020-08-10 DIAGNOSIS — R1310 Dysphagia, unspecified: Secondary | ICD-10-CM | POA: Diagnosis not present

## 2020-08-11 ENCOUNTER — Telehealth: Payer: Self-pay

## 2020-08-11 ENCOUNTER — Telehealth: Payer: Self-pay | Admitting: *Deleted

## 2020-08-11 NOTE — Telephone Encounter (Signed)
Contacted pt to go over lab results pt didn't answer lvm  

## 2020-08-11 NOTE — Telephone Encounter (Signed)
Received call from pt's granddaughter. She states she is calling to let us know how pt is tolerating her new medications-Lomotil and Oxycodone. She states that diarrhea stopped after 1 dose of lomotil.  Pain in her lower back is better controlled with Oxycodone 5 mg as needed every 4-6 hours.  Reminded granddaughter of potential constipation with both lomotil and oxycodone.  She voiced understanding.  She is aware of upcoming scan of spine and appts with Dr. Lorenso Courier.

## 2020-08-16 ENCOUNTER — Inpatient Hospital Stay: Payer: Medicaid Other

## 2020-08-17 ENCOUNTER — Encounter (HOSPITAL_COMMUNITY): Payer: Self-pay | Admitting: Emergency Medicine

## 2020-08-17 ENCOUNTER — Other Ambulatory Visit: Payer: Self-pay

## 2020-08-17 ENCOUNTER — Emergency Department (HOSPITAL_COMMUNITY): Payer: Medicaid Other

## 2020-08-17 ENCOUNTER — Ambulatory Visit (HOSPITAL_COMMUNITY): Admission: RE | Admit: 2020-08-17 | Payer: Medicaid Other | Source: Ambulatory Visit

## 2020-08-17 ENCOUNTER — Inpatient Hospital Stay (HOSPITAL_COMMUNITY)
Admission: EM | Admit: 2020-08-17 | Discharge: 2020-09-02 | DRG: 297 | Disposition: E | Payer: Medicaid Other | Attending: Pulmonary Disease | Admitting: Pulmonary Disease

## 2020-08-17 DIAGNOSIS — E119 Type 2 diabetes mellitus without complications: Secondary | ICD-10-CM | POA: Diagnosis not present

## 2020-08-17 DIAGNOSIS — R7303 Prediabetes: Secondary | ICD-10-CM | POA: Diagnosis not present

## 2020-08-17 DIAGNOSIS — D631 Anemia in chronic kidney disease: Secondary | ICD-10-CM | POA: Diagnosis present

## 2020-08-17 DIAGNOSIS — R4020 Unspecified coma: Secondary | ICD-10-CM | POA: Diagnosis not present

## 2020-08-17 DIAGNOSIS — I469 Cardiac arrest, cause unspecified: Principal | ICD-10-CM | POA: Diagnosis present

## 2020-08-17 DIAGNOSIS — E854 Organ-limited amyloidosis: Secondary | ICD-10-CM | POA: Diagnosis not present

## 2020-08-17 DIAGNOSIS — Z20822 Contact with and (suspected) exposure to covid-19: Secondary | ICD-10-CM | POA: Diagnosis not present

## 2020-08-17 DIAGNOSIS — I5042 Chronic combined systolic (congestive) and diastolic (congestive) heart failure: Secondary | ICD-10-CM | POA: Diagnosis present

## 2020-08-17 DIAGNOSIS — N183 Chronic kidney disease, stage 3 unspecified: Secondary | ICD-10-CM | POA: Diagnosis present

## 2020-08-17 DIAGNOSIS — I43 Cardiomyopathy in diseases classified elsewhere: Secondary | ICD-10-CM | POA: Diagnosis present

## 2020-08-17 DIAGNOSIS — E785 Hyperlipidemia, unspecified: Secondary | ICD-10-CM | POA: Diagnosis present

## 2020-08-17 DIAGNOSIS — R570 Cardiogenic shock: Secondary | ICD-10-CM | POA: Diagnosis not present

## 2020-08-17 DIAGNOSIS — I517 Cardiomegaly: Secondary | ICD-10-CM | POA: Diagnosis not present

## 2020-08-17 DIAGNOSIS — D61818 Other pancytopenia: Secondary | ICD-10-CM | POA: Diagnosis not present

## 2020-08-17 DIAGNOSIS — E8581 Light chain (AL) amyloidosis: Secondary | ICD-10-CM | POA: Diagnosis present

## 2020-08-17 DIAGNOSIS — R404 Transient alteration of awareness: Secondary | ICD-10-CM | POA: Diagnosis not present

## 2020-08-17 DIAGNOSIS — R0689 Other abnormalities of breathing: Secondary | ICD-10-CM | POA: Diagnosis not present

## 2020-08-17 DIAGNOSIS — J189 Pneumonia, unspecified organism: Secondary | ICD-10-CM | POA: Diagnosis not present

## 2020-08-17 DIAGNOSIS — I4891 Unspecified atrial fibrillation: Secondary | ICD-10-CM | POA: Diagnosis not present

## 2020-08-17 DIAGNOSIS — Z79899 Other long term (current) drug therapy: Secondary | ICD-10-CM

## 2020-08-17 DIAGNOSIS — R000 Tachycardia, unspecified: Secondary | ICD-10-CM | POA: Diagnosis not present

## 2020-08-17 DIAGNOSIS — Z7982 Long term (current) use of aspirin: Secondary | ICD-10-CM | POA: Diagnosis not present

## 2020-08-17 DIAGNOSIS — I110 Hypertensive heart disease with heart failure: Secondary | ICD-10-CM | POA: Diagnosis not present

## 2020-08-17 DIAGNOSIS — Z8673 Personal history of transient ischemic attack (TIA), and cerebral infarction without residual deficits: Secondary | ICD-10-CM

## 2020-08-17 DIAGNOSIS — I340 Nonrheumatic mitral (valve) insufficiency: Secondary | ICD-10-CM | POA: Diagnosis present

## 2020-08-17 DIAGNOSIS — Z5111 Encounter for antineoplastic chemotherapy: Secondary | ICD-10-CM | POA: Diagnosis present

## 2020-08-17 DIAGNOSIS — R21 Rash and other nonspecific skin eruption: Secondary | ICD-10-CM | POA: Diagnosis not present

## 2020-08-17 DIAGNOSIS — Z66 Do not resuscitate: Secondary | ICD-10-CM | POA: Diagnosis not present

## 2020-08-17 DIAGNOSIS — J90 Pleural effusion, not elsewhere classified: Secondary | ICD-10-CM | POA: Diagnosis not present

## 2020-08-17 DIAGNOSIS — I959 Hypotension, unspecified: Secondary | ICD-10-CM | POA: Diagnosis not present

## 2020-08-17 DIAGNOSIS — Z5112 Encounter for antineoplastic immunotherapy: Secondary | ICD-10-CM | POA: Diagnosis present

## 2020-08-17 DIAGNOSIS — Z9049 Acquired absence of other specified parts of digestive tract: Secondary | ICD-10-CM | POA: Diagnosis not present

## 2020-08-17 DIAGNOSIS — G931 Anoxic brain damage, not elsewhere classified: Secondary | ICD-10-CM | POA: Diagnosis not present

## 2020-08-17 DIAGNOSIS — R579 Shock, unspecified: Secondary | ICD-10-CM

## 2020-08-17 LAB — RESP PANEL BY RT-PCR (FLU A&B, COVID) ARPGX2
Influenza A by PCR: NEGATIVE
Influenza B by PCR: NEGATIVE
SARS Coronavirus 2 by RT PCR: NEGATIVE

## 2020-08-17 LAB — CBC WITH DIFFERENTIAL/PLATELET
Abs Immature Granulocytes: 1.3 10*3/uL — ABNORMAL HIGH (ref 0.00–0.07)
Basophils Absolute: 0.1 10*3/uL (ref 0.0–0.1)
Basophils Relative: 1 %
Eosinophils Absolute: 0 10*3/uL (ref 0.0–0.5)
Eosinophils Relative: 0 %
HCT: 41.7 % (ref 36.0–46.0)
Hemoglobin: 13.5 g/dL (ref 12.0–15.0)
Immature Granulocytes: 11 %
Lymphocytes Relative: 8 %
Lymphs Abs: 0.9 10*3/uL (ref 0.7–4.0)
MCH: 36 pg — ABNORMAL HIGH (ref 26.0–34.0)
MCHC: 32.4 g/dL (ref 30.0–36.0)
MCV: 111.2 fL — ABNORMAL HIGH (ref 80.0–100.0)
Monocytes Absolute: 1.8 10*3/uL — ABNORMAL HIGH (ref 0.1–1.0)
Monocytes Relative: 15 %
Neutro Abs: 7.8 10*3/uL — ABNORMAL HIGH (ref 1.7–7.7)
Neutrophils Relative %: 65 %
Platelets: 90 10*3/uL — ABNORMAL LOW (ref 150–400)
RBC: 3.75 MIL/uL — ABNORMAL LOW (ref 3.87–5.11)
RDW: 17.3 % — ABNORMAL HIGH (ref 11.5–15.5)
WBC: 11.9 10*3/uL — ABNORMAL HIGH (ref 4.0–10.5)
nRBC: 0.3 % — ABNORMAL HIGH (ref 0.0–0.2)

## 2020-08-17 LAB — COMPREHENSIVE METABOLIC PANEL
ALT: 518 U/L — ABNORMAL HIGH (ref 0–44)
AST: 867 U/L — ABNORMAL HIGH (ref 15–41)
Albumin: 1.6 g/dL — ABNORMAL LOW (ref 3.5–5.0)
Alkaline Phosphatase: 245 U/L — ABNORMAL HIGH (ref 38–126)
Anion gap: 22 — ABNORMAL HIGH (ref 5–15)
BUN: 26 mg/dL — ABNORMAL HIGH (ref 8–23)
CO2: 13 mmol/L — ABNORMAL LOW (ref 22–32)
Calcium: 7.2 mg/dL — ABNORMAL LOW (ref 8.9–10.3)
Chloride: 103 mmol/L (ref 98–111)
Creatinine, Ser: 3.92 mg/dL — ABNORMAL HIGH (ref 0.44–1.00)
GFR, Estimated: 11 mL/min — ABNORMAL LOW (ref >60)
Glucose, Bld: 121 mg/dL — ABNORMAL HIGH (ref 70–99)
Potassium: 4.2 mmol/L (ref 3.5–5.1)
Sodium: 138 mmol/L (ref 135–145)
Total Bilirubin: 4.2 mg/dL — ABNORMAL HIGH (ref 0.3–1.2)
Total Protein: 3.5 g/dL — ABNORMAL LOW (ref 6.5–8.1)

## 2020-08-17 LAB — LACTIC ACID, PLASMA: Lactic Acid, Venous: >11 mmol/L (ref 0.5–1.9)

## 2020-08-17 LAB — TROPONIN I (HIGH SENSITIVITY): Troponin I (High Sensitivity): 4033 ng/L (ref <18)

## 2020-08-17 LAB — PROTIME-INR
INR: 3.2 — ABNORMAL HIGH (ref 0.8–1.2)
Prothrombin Time: 31.8 seconds — ABNORMAL HIGH (ref 11.4–15.2)

## 2020-08-17 LAB — LIPASE, BLOOD: Lipase: 37 U/L (ref 11–51)

## 2020-08-17 MED ORDER — SODIUM CHLORIDE 0.9 % IV SOLN: 250.0000 mL | INTRAVENOUS | Status: DC

## 2020-08-17 MED ORDER — NOREPINEPHRINE 4 MG/250ML-% IV SOLN: 2.0000 ug/min | INTRAVENOUS | Status: DC

## 2020-08-17 MED ORDER — FENTANYL 2500MCG IN NS 250ML (10MCG/ML) PREMIX INFUSION
0.0000 ug/h | INTRAVENOUS | Status: DC
  Administered 2020-08-17: 25 ug/h via INTRAVENOUS
  Filled 2020-08-17: qty 250

## 2020-08-17 MED ORDER — SODIUM CHLORIDE 0.9 % IV SOLN
INTRAVENOUS | Status: AC | PRN
  Administered 2020-08-17: 150 mL/h via INTRAVENOUS

## 2020-08-17 MED ORDER — SODIUM CHLORIDE 0.9 % IV SOLN
250.0000 mL | INTRAVENOUS | Status: DC
  Administered 2020-08-17 (×2): 250 mL via INTRAVENOUS

## 2020-08-17 MED ORDER — SODIUM CHLORIDE 0.9 % IV SOLN
INTRAVENOUS | Status: AC | PRN
  Administered 2020-08-17: 1000 mL via INTRAVENOUS
  Administered 2020-08-17: 250 mL via INTRAVENOUS

## 2020-08-17 MED ORDER — FENTANYL BOLUS VIA INFUSION
50.0000 ug | INTRAVENOUS | Status: DC | PRN
  Filled 2020-08-17: qty 50

## 2020-08-17 MED ORDER — NOREPINEPHRINE 4 MG/250ML-% IV SOLN
INTRAVENOUS | Status: AC
  Administered 2020-08-17: 18:00:00 4 mg
  Administered 2020-08-17: 18:00:00 4 ug/min via INTRAVENOUS
  Filled 2020-08-17: qty 250

## 2020-08-21 ENCOUNTER — Ambulatory Visit: Payer: Medicaid Other | Admitting: Cardiology

## 2020-08-21 ENCOUNTER — Telehealth: Payer: Self-pay | Admitting: Internal Medicine

## 2020-08-21 NOTE — Telephone Encounter (Signed)
Phone call placed to patient's granddaughter this morning.  I gave my condolences for the loss of her grandmother.  Granddaughter was very thankful and appreciative of me calling and told me that her grandmother always appreciated everything that I did for her and felt that I was a very dedicated doctor.  I thank her for her kind remarks and again extended my sincere condolences for her loss and asked that she extend my condolences to the rest of her family.

## 2020-08-22 LAB — CULTURE, BLOOD (ROUTINE X 2)
Culture: NO GROWTH
Culture: NO GROWTH
Special Requests: ADEQUATE
Special Requests: ADEQUATE

## 2020-08-23 ENCOUNTER — Inpatient Hospital Stay: Payer: Medicaid Other

## 2020-08-23 ENCOUNTER — Inpatient Hospital Stay: Payer: Medicaid Other | Admitting: Hematology and Oncology

## 2020-08-30 ENCOUNTER — Inpatient Hospital Stay: Payer: Medicaid Other

## 2020-09-02 NOTE — ED Triage Notes (Signed)
Pt brought to ED by [facility] from home for a post CPR, 7 round of CPR and 2 epi given by EMS prior to arrival. Pt unresponsive on ED arrival, [facility] airway in place.

## 2020-09-02 NOTE — ED Notes (Signed)
Date and time results received: 2020-09-16   Test:trop  Critical Value: 4033  Name of Provider Notified: Kipp Brood

## 2020-09-02 NOTE — ED Provider Notes (Signed)
[facility] EMERGENCY DEPARTMENT Provider Note   CSN: [id number] Arrival date & time: 2020/08/31  1740     History Chief Complaint  Patient presents with   post cpr    Mikayla Reynolds is a 80 y.o. female.  She has a history of cardiac amyloidosis.  She was at home and reportedly per EMS has been feeling well for last few days.  EMS was called for unresponsive.  Fire found her without pulses and the ED recommended no shock.  CPR was administered and when ALS got there they gave 3-4 rounds of epi.  Initial presentation rhythm was PEA which improved to A. fib with RVR.  Has been having some breathing effort otherwise no other neurologic activity.  The history is provided by the EMS personnel.  Cardiac Arrest Witnessed by:  Family member Incident location:  Home Condition upon EMS arrival:  Unresponsive Pulse:  Absent Initial cardiac rhythm per EMS:  PEA Treatments prior to arrival:  ACLS protocol Medications given prior to ED:  Epinephrine Airway:  Combitube Rhythm on admission to ED:  Atrial fibrillation Risk factors: heart problem        Past Medical History:  Diagnosis Date   Abnormal liver function    Anemia, unspecified    Chronic combined systolic and diastolic CHF (congestive heart failure) (HCC)    CKD (chronic kidney disease), stage III ([place]) 02/19/2018   Elevated troponin 12/27/2017   Hyperlipidemia    Junctional bradycardia    Mitral regurgitation    Pre-diabetes    Renal mass    Stroke [facility])     Patient Active Problem List   Diagnosis Date Noted   Cardiac amyloidosis ([place]) 04/30/2020   Mild depression ([place]) 03/27/2020   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   Chronic combined systolic and diastolic CHF (congestive heart failure) ([place]) 06/07/2019   Cardiomyopathy ([place]) 04/09/2019   Right kidney mass 04/09/2019   History of loop recorder 03/11/2019   Macrocytic anemia 03/11/2019   CKD (chronic  kidney disease), stage III ([place]) 02/19/2018   Stroke ([place]) 12/27/2017   Elevated troponin 12/27/2017   Chest pain 12/27/2017   Essential hypertension     Past Surgical History:  Procedure Laterality Date   abdominal wall surgery  2017   APPENDECTOMY     LOOP RECORDER INSERTION N/A 06/23/2018   Procedure: LOOP RECORDER INSERTION;  Surgeon: [name], MD;  Location: [place] [facility];  Service: Cardiovascular;  Laterality: N/A;   TEE WITHOUT CARDIOVERSION N/A 06/23/2018   Procedure: TRANSESOPHAGEAL ECHOCARDIOGRAM (TEE);  Surgeon: [name], MD;  Location: [facility] ENDOSCOPY;  Service: Cardiovascular;  Laterality: N/A;     OB History   No obstetric history on file.     Family History  Problem Relation Age of Onset   Healthy Sister    Healthy Brother     Social History   Tobacco Use   Smoking status: Never Smoker   Smokeless tobacco: Never Used  Scientific laboratory technician Use: Never used  Substance Use Topics   Alcohol use: Not Currently   Drug use: Not Currently    Home Medications Prior to Admission medications   Medication Sig Start Date End Date Taking? Authorizing Provider  acetaminophen (TYLENOL) 500 MG tablet Take 1 tablet (500 mg total) by mouth every 6 (six) hours as needed. 07/26/20   Orson Slick, MD  acyclovir (ZOVIRAX) 400 MG tablet Take 1 tablet (400 mg total) by mouth daily. 05/11/20  Orson Slick, MD  aspirin EC 81 MG tablet Take 1 tablet (81 mg total) by mouth daily. Swallow whole. 02/21/20   Dunn, Nedra Hai, PA-C  atorvastatin (LIPITOR) 40 MG tablet Take 1 tablet (40 mg total) by mouth daily at 6 PM. 11/17/19   Jerline Pain, MD  diphenoxylate-atropine (LOMOTIL) 2.5-0.025 MG tablet Take 1 tablet by mouth 4 (four) times daily as needed for diarrhea or loose stools. 08/09/20   Orson Slick, MD  furosemide (LASIX) 20 MG tablet Take 1 tablet (20 mg total) by mouth every other day. 08/09/20 11/07/20  Orson Slick, MD  guaiFENesin  (MUCINEX) 600 MG 12 hr tablet Take 1 tablet (600 mg total) by mouth 2 (two) times daily as needed. Patient not taking: Reported on 07/31/2020 07/26/20   Orson Slick, MD  loperamide (IMODIUM) 2 MG capsule Take 1 capsule (2 mg total) by mouth as needed for diarrhea or loose stools. 06/14/20   Orson Slick, MD  Methylcellulose, Laxative, (FIBER THERAPY) 500 MG TABS Take by mouth daily. Patient not taking: Reported on 07/26/2020    [provider]  midodrine (PROAMATINE) 5 MG tablet Take 1 tablet (5 mg total) by mouth 3 (three) times daily with meals. 06/15/20   Bensimhon, Shaune Pascal, MD  oxyCODONE (OXY IR/ROXICODONE) 5 MG immediate release tablet Take 1 tablet (5 mg total) by mouth every 4 (four) hours as needed for severe pain. 08/09/20   Orson Slick, MD  Polyethylene Glycol 3350 (MIRALAX PO) Take by mouth.    [provider]  potassium chloride SA (KLOR-CON) 20 MEQ tablet Take 2 tablets by mouth for 2 days then hold 03/09/20   Dunn, Nedra Hai, PA-C  spironolactone (ALDACTONE) 25 MG tablet Take 0.5 tablets (12.5 mg total) by mouth daily. 08/09/20   Orson Slick, MD  traMADol (ULTRAM) 50 MG tablet Take 1 tablet (50 mg total) by mouth every 6 (six) hours as needed. 07/26/20   Orson Slick, MD    Allergies    Patient has no known allergies.  Review of Systems   Review of Systems  Unable to perform ROS: Patient unresponsive    Physical Exam Updated Vital Signs BP (!) 56/40    Pulse (!) 121    Temp (!) 95.8 F (35.4 C) (Temporal)    Resp 16    Ht 5' (1.524 m)    Wt 58.9 kg    SpO2 (!) 72%    BMI 25.36 kg/m   Physical Exam Vitals and nursing note reviewed.  Constitutional:      Appearance: She is well-nourished. She is cachectic. She is ill-appearing.     Comments: Unresponsive  HENT:     Head: Normocephalic and atraumatic.     Mouth/Throat:     Comments: Orally intubated Eyes:     Conjunctiva/sclera: Conjunctivae normal.     Comments: Pupils  nonreactive  Neck:     Comments: Trach midline Cardiovascular:     Rate and Rhythm: Tachycardia present. Rhythm irregular.     Heart sounds: No murmur heard.   Pulmonary:     Effort: No respiratory distress.     Comments: Equal breath sounds by ambo, some spontaneous respiratory effort Abdominal:     Palpations: Abdomen is soft.     Tenderness: There is no abdominal tenderness. There is no guarding or rebound.  Musculoskeletal:        General: No deformity or signs  of injury.     Right lower leg: Edema present.     Left lower leg: Edema present.     Comments: IO left shoulder  Skin:    Comments: Cool and dry  Neurological:     Comments: Patient has no corneal reflexes.  She has no spontaneous motor movement and no response to pain.  Psychiatric:        Mood and Affect: Mood and affect normal.     ED Results / Procedures / Treatments   Labs (all labs ordered are listed, but only abnormal results are displayed) Labs Reviewed  COMPREHENSIVE METABOLIC PANEL - Abnormal; Notable for the following components:      Result Value   CO2 13 (*)    Glucose, Bld 121 (*)    BUN 26 (*)    Creatinine, Ser 3.92 (*)    Calcium 7.2 (*)    Total Protein 3.5 (*)    Albumin 1.6 (*)    AST 867 (*)    ALT 518 (*)    Alkaline Phosphatase 245 (*)    Total Bilirubin 4.2 (*)    GFR, Estimated 11 (*)    Anion gap 22 (*)    All other components within normal limits  LACTIC ACID, PLASMA - Abnormal; Notable for the following components:   Lactic Acid, Venous >11.0 (*)    All other components within normal limits  CBC WITH DIFFERENTIAL/PLATELET - Abnormal; Notable for the following components:   WBC 11.9 (*)    RBC 3.75 (*)    MCV 111.2 (*)    MCH 36.0 (*)    RDW 17.3 (*)    Platelets 90 (*)    nRBC 0.3 (*)    Neutro Abs 7.8 (*)    Monocytes Absolute 1.8 (*)    Abs Immature Granulocytes 1.30 (*)    All other components within normal limits  PROTIME-INR - Abnormal; Notable for the  following components:   Prothrombin Time 31.8 (*)    INR 3.2 (*)    All other components within normal limits  TROPONIN I (HIGH SENSITIVITY) - Abnormal; Notable for the following components:   Troponin I (High Sensitivity) 4,033 (*)    All other components within normal limits  CULTURE, BLOOD (ROUTINE X 2)  CULTURE, BLOOD (ROUTINE X 2)  RESP PANEL BY RT-PCR (FLU A&B, COVID) ARPGX2  URINE CULTURE  LIPASE, BLOOD  TROPONIN I (HIGH SENSITIVITY)    EKG None EKG showing A. fib with rapid ventricular response, no acute ST-T changes. Radiology DG Chest Port 1 View  Result Date: 09/04/20 CLINICAL DATA:  80 year old female with unresponsiveness. EXAM: PORTABLE CHEST 1 VIEW COMPARISON:  Chest radiograph dated 02/21/2020. FINDINGS: Endotracheal tube with tip approximately 4.5 cm above the carina. Enteric tube extends to the distal esophagus and fold back on itself with tip position in the mid esophagus. Recommend retraction and repositioning. There is cardiomegaly with vascular congestion and edema. Pneumonia is not excluded clinical correlation is recommended. A small right pleural effusion may be present. No pneumothorax. Atherosclerotic calcification of the aorta. No acute osseous pathology. A loop recorder device noted. IMPRESSION: 1. Endotracheal tube above the carina. 2. Enteric tube fold back on itself with tip position in the mid esophagus. Recommend retraction and repositioning. 3. Cardiomegaly with vascular congestion and edema. Electronically Signed   By: Anner Crete M.D.   On: 09/04/20 18:26    Procedures .Critical Care Performed by: Hayden Rasmussen, MD Authorized by: Hayden Rasmussen, MD  Critical care provider statement:    Critical care time (minutes):  45   Critical care time was exclusive of:  Separately billable procedures and treating other patients   Critical care was time spent personally by me on the following activities:  Discussions with consultants,  evaluation of patient's response to treatment, examination of patient, ordering and performing treatments and interventions, ordering and review of laboratory studies, ordering and review of radiographic studies, pulse oximetry, re-evaluation of patient's condition, obtaining history from patient or surrogate, review of old charts and development of treatment plan with patient or surrogate Procedure Name: Intubation Date/Time: 09-16-2020 6:27 PM Performed by: Hayden Rasmussen, MD Pre-anesthesia Checklist: Patient identified, Patient being monitored, Emergency Drugs available, Timeout performed and Suction available Oxygen Delivery Method: Non-rebreather mask Preoxygenation: Pre-oxygenation with 100% oxygen Ventilation: Mask ventilation without difficulty Laryngoscope Size: Glidescope and 3 Grade View: Grade II Tube size: 7.5 mm Number of attempts: 1 Placement Confirmation: ETT inserted through vocal cords under direct vision,  CO2 detector and Breath sounds checked- equal and bilateral Dental Injury: Teeth and Oropharynx as per pre-operative assessment     Ultrasound ED Echo  Date/Time: 09/16/20 6:27 PM Performed by: Hayden Rasmussen, MD Authorized by: Hayden Rasmussen, MD   Procedure details:    Indications: cardiac arrest     Views: parasternal long axis view, parasternal short axis view and apical 4 chamber view     Images: archived     Limitations:  Positioning Findings:    Pericardium: no pericardial effusion     Cardiac Activity comment:  Diminished   LV Function: severly depressed (<30%)   Impression:    Impression: decreased contractility     (including critical care time)  Medications Ordered in ED Medications  0.9 %  sodium chloride infusion (250 mLs Intravenous Bolus from Bag Sep 16, 2020 2011)  norepinephrine (LEVOPHED) 4mg  in 29mL premix infusion (10 mcg/min Intravenous Infusion Verify September 16, 2020 1935)  0.9 %  sodium chloride infusion (has no administration in  time range)  fentaNYL 2565mcg in NS 236mL (48mcg/ml) infusion-PREMIX (25 mcg/hr Intravenous New Bag/Given Sep 16, 2020 2015)  fentaNYL (SUBLIMAZE) bolus via infusion 50 mcg (has no administration in time range)  0.9 %  sodium chloride infusion (250 mLs Intravenous New Bag/Given Sep 16, 2020 1820)  norepinephrine (LEVOPHED) 4-5 MG/250ML-% infusion SOLN (4 mg  New Bag/Given 16-Sep-2020 1810)  0.9 %  sodium chloride infusion (150 mL/hr Intravenous New Bag/Given 09/16/20 1959)    ED Course  I have reviewed the triage vital signs and the nursing notes.  Pertinent labs & imaging results that were available during my care of the patient were reviewed by me and considered in my medical decision making (see chart for details).  Clinical Course as of 08/18/20 1114  09-16-2020  1829 Femoral stick right groin to get blood.  Betadine prep.  18-gauge single pass. [MB]  9211 Blood pressure low Levophed started [MB]  1939 ICU was evaluated the patient and her admitting her to their service.  I updated the family on her condition.  It sounds like ICU talk to family and are making her DNR. [MB]  2327 Called to bedside as patient has become asystolic. Patient at the vent. No spontaneous respirations. No pulses. No response to pain. Pronounced at 1124. Family at bedside. [MB]    Clinical Course User Index [MB] Hayden Rasmussen, MD   MDM Rules/Calculators/A&P  This patient complains of cardiac arrest; this involves an extensive number of treatment Options and is a complaint that carries with it a high risk of complications and Morbidity. The differential includes arrhythmia, ACS, PE sepsis, metabolic derangement  I ordered, reviewed and interpreted labs, which included CBC with elevated white count, normal hemoglobin, low platelets similar to prior, chemistries with elevated creatinine new AKI, elevated LFTs, troponin markedly elevated, lactic acid critical, Covid testing negative I  ordered medication IV fluids and Levophed for hypotension.  Foley was placed by the nurse and there was no urine output. I ordered imaging studies which included chest x-ray and I independently    visualized and interpreted imaging which showed ET tube above the carina no pneumothorax Additional history obtained from EMS and patient's family members Previous records obtained and reviewed in epic, patient has a history of cardiac amyloidosis and was receiving chemotherapy I consulted critical care and discussed lab and imaging findings  Critical Interventions: Removal of King airway and intubation with glide scope, initiation of pressors for her hypotension  After the interventions stated above, I reevaluated the patient and found remains without any neurologic activity other than some minimal respiratory effort.  She is still in shock.  She has made no urine.  Discussed with patient's husband and daughter the critical nature of her illness.  ICU team down to evaluate patient.  They are admitting the patient and transitioning her to comfort care only.  Patient has been made a DNR.  At 11:24 PM I was called to bedside as the patient had lost pulses and was asystolic.  Upon the patient to have no neurologic activity no pulses and no spontaneous respiratory effort.  I pronounced her at that time.  Do not feel she is a medical examiner case due to her multiple comorbidities.  Family at bedside.  Updated ICU team.   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

## 2020-09-02 NOTE — ED Notes (Signed)
Unable to get continues SPO2 reading.

## 2020-09-02 NOTE — ED Notes (Signed)
Family refusing to have blood work done since they decided to make her comfort care only.

## 2020-09-02 NOTE — ED Notes (Signed)
Multiple attempts made by this RN and phlebotomist to get labs with no success.

## 2020-09-02 NOTE — ED Notes (Signed)
Levophed increased to 10 mcg/min and intensive care provider notified, family at the bedside, orders for DNR to be placed by intensive care provider.

## 2020-09-02 NOTE — H&P (Signed)
 NAME:  Mikayla Reynolds, MRN:  [id number], DOB:  02/02/1941, LOS: 0 ADMISSION DATE:  08/02/2020, CONSULTATION DATE:  08/04/2020 REFERRING MD:  Butler, Michael, CHIEF COMPLAINT:  Cardiac arrest.   HPI/course in hospital  80 year old woman with newly diagnosed multiple myeloma and known cardiomyopathy with EF 25%.  Unwell for the past week. Found down at home. In cardiac arrest  For EMS. PEA with 20min of CPR prior to ROSC.  Has not regained consciousness.  Past Medical History   Past Medical History:  Diagnosis Date  . Abnormal liver function   . Anemia, unspecified   . Chronic combined systolic and diastolic CHF (congestive heart failure) (HCC)   . CKD (chronic kidney disease), stage III (HCC) 02/19/2018  . Elevated troponin 12/27/2017  . Hyperlipidemia   . Junctional bradycardia   . Mitral regurgitation   . Pre-diabetes   . Renal mass   . Stroke (HCC)      Past Surgical History:  Procedure Laterality Date  . abdominal wall surgery  2017  . APPENDECTOMY    . LOOP RECORDER INSERTION N/A 06/23/2018   Procedure: LOOP RECORDER INSERTION;  Surgeon: Allred, James, MD;  Location: [facility];  Service: Cardiovascular;  Laterality: N/A;  . TEE WITHOUT CARDIOVERSION N/A 06/23/2018   Procedure: TRANSESOPHAGEAL ECHOCARDIOGRAM (TEE);  Surgeon: Turner, Traci R, MD;  Location: [facility];  Service: Cardiovascular;  Laterality: N/A;     Review of Systems:   Review of Systems  Unable to perform ROS: Critical illness    Social History   reports that she has never smoked. She has never used smokeless tobacco. She reports previous alcohol use. She reports previous drug use.   Family History   Her family history includes Healthy in her brother and sister.   Allergies No Known Allergies   Home Medications  Prior to Admission medications   Medication Sig Start Date End Date Taking? Authorizing Provider  acetaminophen (TYLENOL) 500 MG tablet Take 1 tablet (500 mg total) by mouth  every 6 (six) hours as needed. 07/26/20   Dorsey, John T IV, MD  acyclovir (ZOVIRAX) 400 MG tablet Take 1 tablet (400 mg total) by mouth daily. 05/11/20   Dorsey, John T IV, MD  aspirin EC 81 MG tablet Take 1 tablet (81 mg total) by mouth daily. Swallow whole. 02/21/20   Dunn, Dayna N, PA-C  atorvastatin (LIPITOR) 40 MG tablet Take 1 tablet (40 mg total) by mouth daily at 6 PM. 11/17/19   Skains, Mark C, MD  diphenoxylate-atropine (LOMOTIL) 2.5-0.025 MG tablet Take 1 tablet by mouth 4 (four) times daily as needed for diarrhea or loose stools. 08/09/20   Dorsey, John T IV, MD  furosemide (LASIX) 20 MG tablet Take 1 tablet (20 mg total) by mouth every other day. 08/09/20 11/07/20  Dorsey, John T IV, MD  guaiFENesin (MUCINEX) 600 MG 12 hr tablet Take 1 tablet (600 mg total) by mouth 2 (two) times daily as needed. Patient not taking: Reported on 07/31/2020 07/26/20   Dorsey, John T IV, MD  loperamide (IMODIUM) 2 MG capsule Take 1 capsule (2 mg total) by mouth as needed for diarrhea or loose stools. 06/14/20   Dorsey, John T IV, MD  Methylcellulose, Laxative, (FIBER THERAPY) 500 MG TABS Take by mouth daily. Patient not taking: Reported on 07/26/2020    [provider]  midodrine (PROAMATINE) 5 MG tablet Take 1 tablet (5 mg total) by mouth 3 (three) times daily with meals. 06/15/20     Bensimhon, Daniel R, MD  oxyCODONE (OXY IR/ROXICODONE) 5 MG immediate release tablet Take 1 tablet (5 mg total) by mouth every 4 (four) hours as needed for severe pain. 08/09/20   Dorsey, John T IV, MD  Polyethylene Glycol 3350 (MIRALAX PO) Take by mouth.    [provider]  potassium chloride SA (KLOR-CON) 20 MEQ tablet Take 2 tablets by mouth for 2 days then hold 03/09/20   Dunn, Dayna N, PA-C  spironolactone (ALDACTONE) 25 MG tablet Take 0.5 tablets (12.5 mg total) by mouth daily. 08/09/20   Dorsey, John T IV, MD  traMADol (ULTRAM) 50 MG tablet Take 1 tablet (50 mg total) by mouth every 6 (six) hours as needed.  07/26/20   Dorsey, John T IV, MD     Interim history/subjective:  On increasing doses of vasopressors.   Objective   Blood pressure (!) 83/70, pulse (!) 48, temperature (!) 94 F (34.4 C), resp. rate (!) 22, height 5' (1.524 m), weight 58.9 kg, SpO2 93 %.    Vent Mode: PRVC FiO2 (%):  [100 %] 100 % Set Rate:  [18 bmp] 18 bmp Vt Set:  [450 mL-510 mL] 450 mL PEEP:  [8 cmH20] 8 cmH20   Intake/Output Summary (Last 24 hours) at 08/06/2020 1940 Last data filed at 08/21/2020 1935 Gross per 24 hour  Intake 286.92 ml  Output 0 ml  Net 286.92 ml   Filed Weights   08/18/2020 1743  Weight: 58.9 kg    Examination: Physical Exam Constitutional:      Appearance: She is cachectic.     Interventions: She is sedated and intubated.     Comments: comatose  HENT:     Head:     Comments: Orally intubated  Eyes:     Conjunctiva/sclera: Conjunctivae normal.     Comments: Pupils fixed  Cardiovascular:     Rate and Rhythm: Tachycardia present.     Heart sounds: Normal heart sounds.  Pulmonary:     Effort: She is intubated.     Breath sounds: Wheezes: mechanically ventilated.  Abdominal:     General: Abdomen is flat.     Palpations: Abdomen is soft.  Musculoskeletal:     Right lower leg: 2+ Pitting Edema present.     Left lower leg: 2+ Pitting Edema present.  Neurological:     Mental Status: She is unresponsive.     GCS: GCS eye subscore is 1. GCS verbal subscore is 1. GCS motor subscore is 2.      Ancillary tests (personally reviewed)  CBC: Recent Labs  Lab 08/20/2020 1828  WBC 11.9*  NEUTROABS 7.8*  HGB 13.5  HCT 41.7  MCV 111.2*  PLT 90*    Basic Metabolic Panel: No results for input(s): NA, K, CL, CO2, GLUCOSE, BUN, CREATININE, CALCIUM, MG, PHOS in the last 168 hours. GFR: Estimated Creatinine Clearance: 35.1 mL/min (A) (by C-G formula based on SCr of 1.06 mg/dL (H)). Recent Labs  Lab 08/08/2020 1828  WBC 11.9*  LATICACIDVEN >11.0*    Liver Function Tests: No  results for input(s): AST, ALT, ALKPHOS, BILITOT, PROT, ALBUMIN in the last 168 hours. No results for input(s): LIPASE, AMYLASE in the last 168 hours. No results for input(s): AMMONIA in the last 168 hours.  ABG    Component Value Date/Time   TCO2 26 12/27/2017 1316     Coagulation Profile: Recent Labs  Lab 08/15/2020 1828  INR 3.2*    Cardiac Enzymes: No results for input(s): CKTOTAL, CKMB, CKMBINDEX, TROPONINI   in the last 168 hours.  HbA1C: HbA1c, POC (prediabetic range)  Date/Time Value Ref Range Status  05/22/2018 05:00 PM 5.7 5.7 - 6.4 % Final   Hgb A1c MFr Bld  Date/Time Value Ref Range Status  03/11/2019 11:57 AM 6.1 (H) 4.8 - 5.6 % Final    Comment:             Prediabetes: 5.7 - 6.4          Diabetes: >6.4          Glycemic control for adults with diabetes: <7.0   12/28/2017 05:29 AM 6.0 (H) 4.8 - 5.6 % Final    Comment:    (NOTE) Pre diabetes:          5.7%-6.4% Diabetes:              >6.4% Glycemic control for   <7.0% adults with diabetes     CBG: No results for input(s): GLUCAP in the last 168 hours.   Assessment & Plan:  Critically ill due to cardiac arrest without spontaneous recovery of consciousness requiring mechanical ventilation for airway protection. Critically ills due to distributive and cardiogenic shock requiring titration of vasopressors.  Poor premorbid condition with advanced age, heart failure and malignancy   Plan:  Dismal prognosis: poor neurological recovery likely and best out come would further decline with already advanced co-morbidities.  Currently in worsening shock, further indicating poor prognosis.   Have advised family that best course of action given overall picture is to proceed to transition to comfort care and compassionate extubation once the family has gathered.  Daily Goals Checklist  Pain/Anxiety/Delirium protocol (if indicated): fentanyl infusioin VAP protocol (if indicated): bundle in place Respiratory  support goals: full support pending withdrawal Blood pressure target: MAP >65.  DVT prophylaxis: hold given comfort care  Nutritional status and feeding goals: NPO GI prophylaxis: none given comfort care Fluid status goals: IV NS at 75 Urinary catheter: end of life Central lines: none Glucose control: N/A Mobility/therapy needs: bedrest Antibiotic de-escalation: none Home medication reconciliation: N/A Daily labs: N/A Code Status: DNR Family Communication: spoke to family at the bedside. Disposition: ICU  CRITICAL CARE Performed by: Ravi Agarwala   Total critical care time: 40 minutes  Critical care time was exclusive of separately billable procedures and treating other patients.  Critical care was necessary to treat or prevent imminent or life-threatening deterioration.  Critical care was time spent personally by me on the following activities: development of treatment plan with patient and/or surrogate as well as nursing, discussions with consultants, evaluation of patient's response to treatment, examination of patient, obtaining history from patient or surrogate, ordering and performing treatments and interventions, ordering and review of laboratory studies, ordering and review of radiographic studies, pulse oximetry, re-evaluation of patient's condition and participation in multidisciplinary rounds.  Ravi Agarwala, MD [facility] ICU Physician [facility]  Pager: [phone] Mobile: [phone] After hours: [phone].     08/24/2020, 7:40 PM       

## 2020-09-02 NOTE — ED Notes (Signed)
Critical care provider at the bedside. 

## 2020-09-02 NOTE — ED Notes (Signed)
No urine return or output on foley placement

## 2020-09-02 NOTE — ED Notes (Signed)
Pt on comfort care only, cool skin, SPO2 reading intermittent, unable to get accurate SPO2 reading, EDP and admitted provider aware.

## 2020-09-02 NOTE — ED Notes (Signed)
Only able to get one 2 cc blood on blue top blood culture by this RN

## 2020-09-02 DEATH — deceased

## 2020-09-06 ENCOUNTER — Ambulatory Visit: Payer: Medicaid Other | Admitting: Hematology and Oncology

## 2020-09-06 ENCOUNTER — Other Ambulatory Visit: Payer: Medicaid Other

## 2020-09-06 ENCOUNTER — Ambulatory Visit: Payer: Medicaid Other

## 2020-09-13 ENCOUNTER — Other Ambulatory Visit: Payer: Medicaid Other

## 2020-09-13 ENCOUNTER — Ambulatory Visit: Payer: Medicaid Other

## 2020-09-19 ENCOUNTER — Encounter (HOSPITAL_COMMUNITY): Payer: Medicaid Other | Admitting: Internal Medicine

## 2020-09-20 ENCOUNTER — Other Ambulatory Visit: Payer: Medicaid Other

## 2020-09-20 ENCOUNTER — Ambulatory Visit: Payer: Medicaid Other | Admitting: Hematology and Oncology

## 2020-09-20 ENCOUNTER — Ambulatory Visit: Payer: Medicaid Other

## 2020-09-21 ENCOUNTER — Ambulatory Visit: Payer: Medicaid Other | Admitting: Adult Health

## 2020-09-27 ENCOUNTER — Ambulatory Visit: Payer: Medicaid Other

## 2020-09-27 ENCOUNTER — Other Ambulatory Visit: Payer: Medicaid Other

## 2022-04-30 IMAGING — DX DG CHEST 2V
2 series · 2 of 2 positions shown · non-contrast
Comparison: December 27, 2017.

CLINICAL DATA: Shortness of breath.

EXAM:
CHEST - 2 VIEW

[dg chest 2 view (1 of 2)]
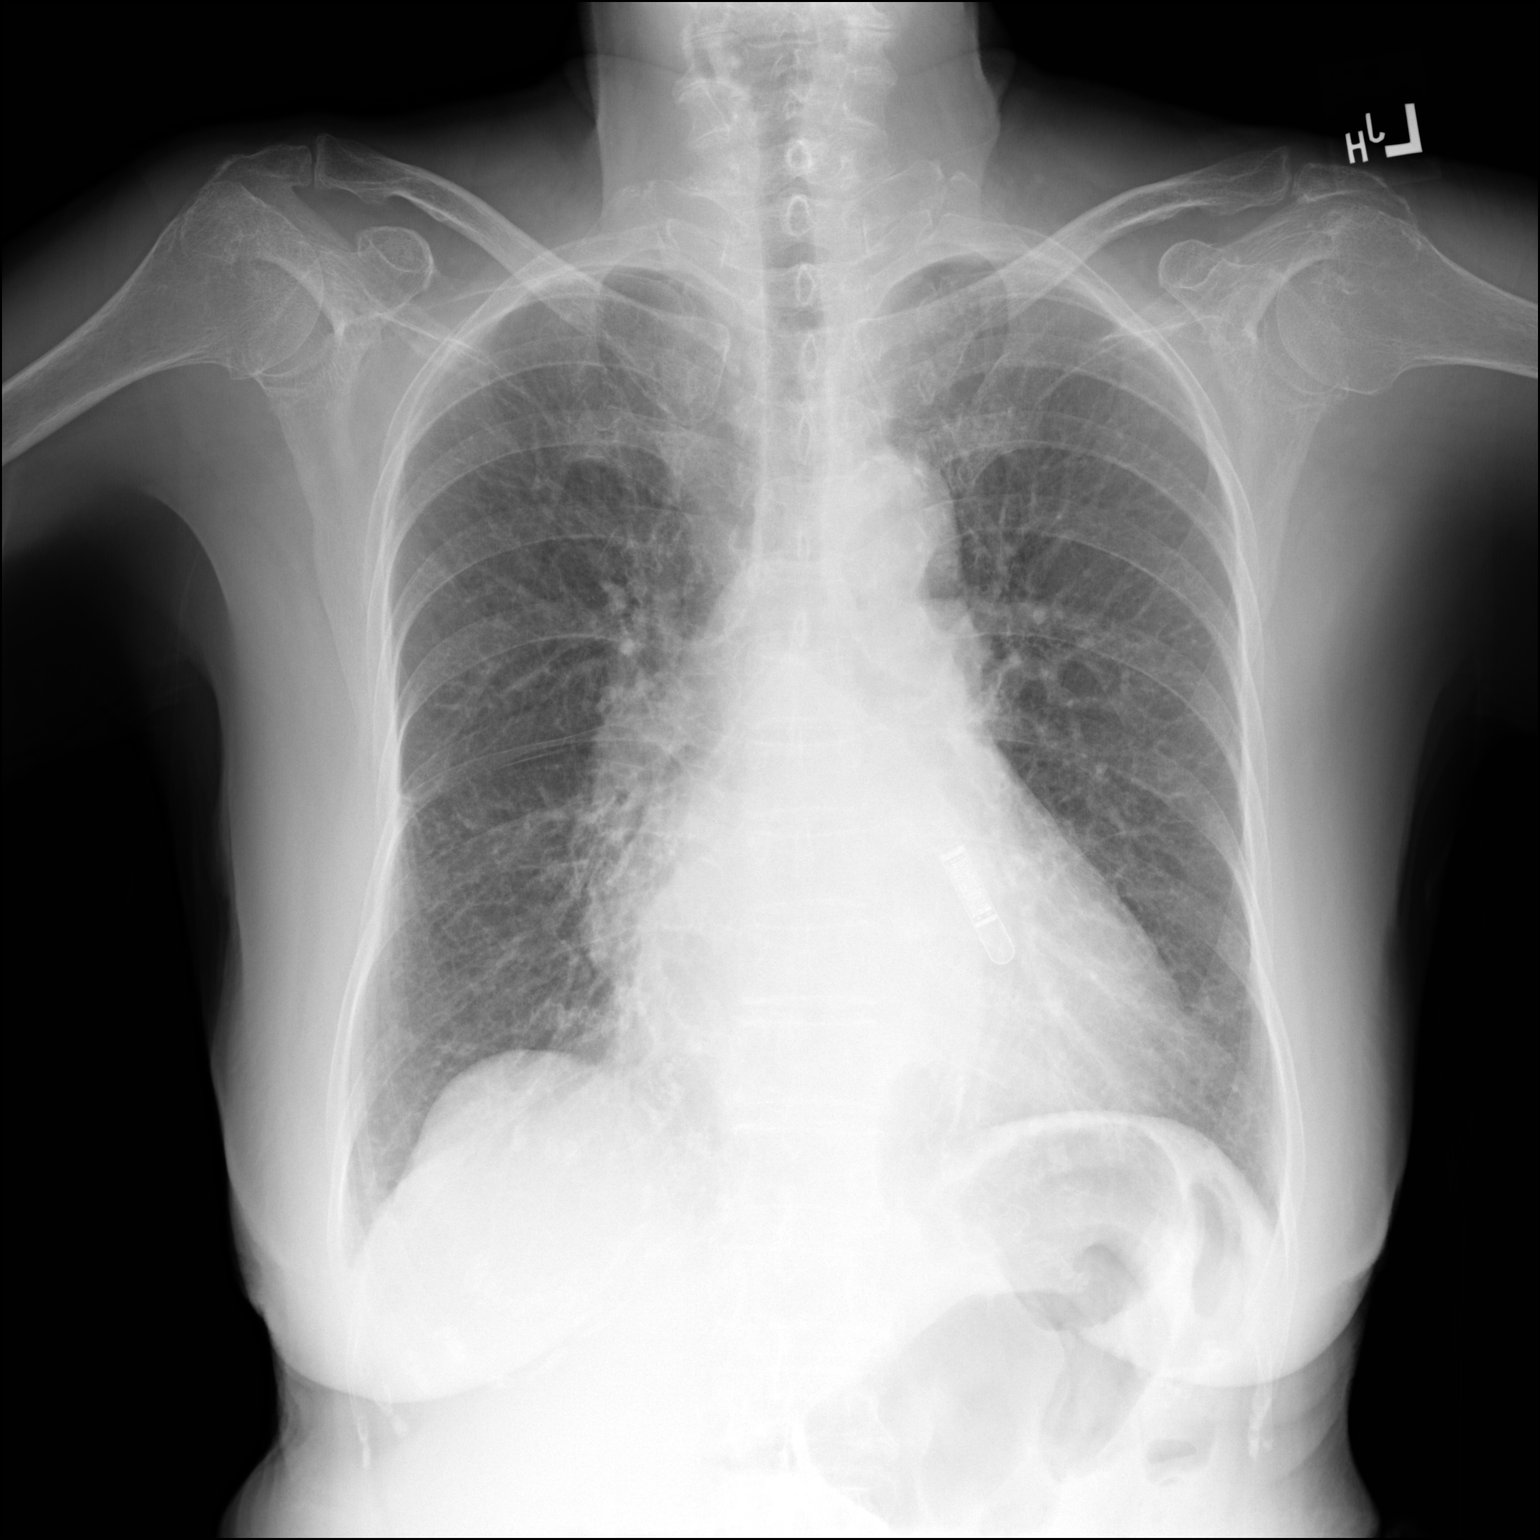

[dg chest 2 view (2 of 2)]
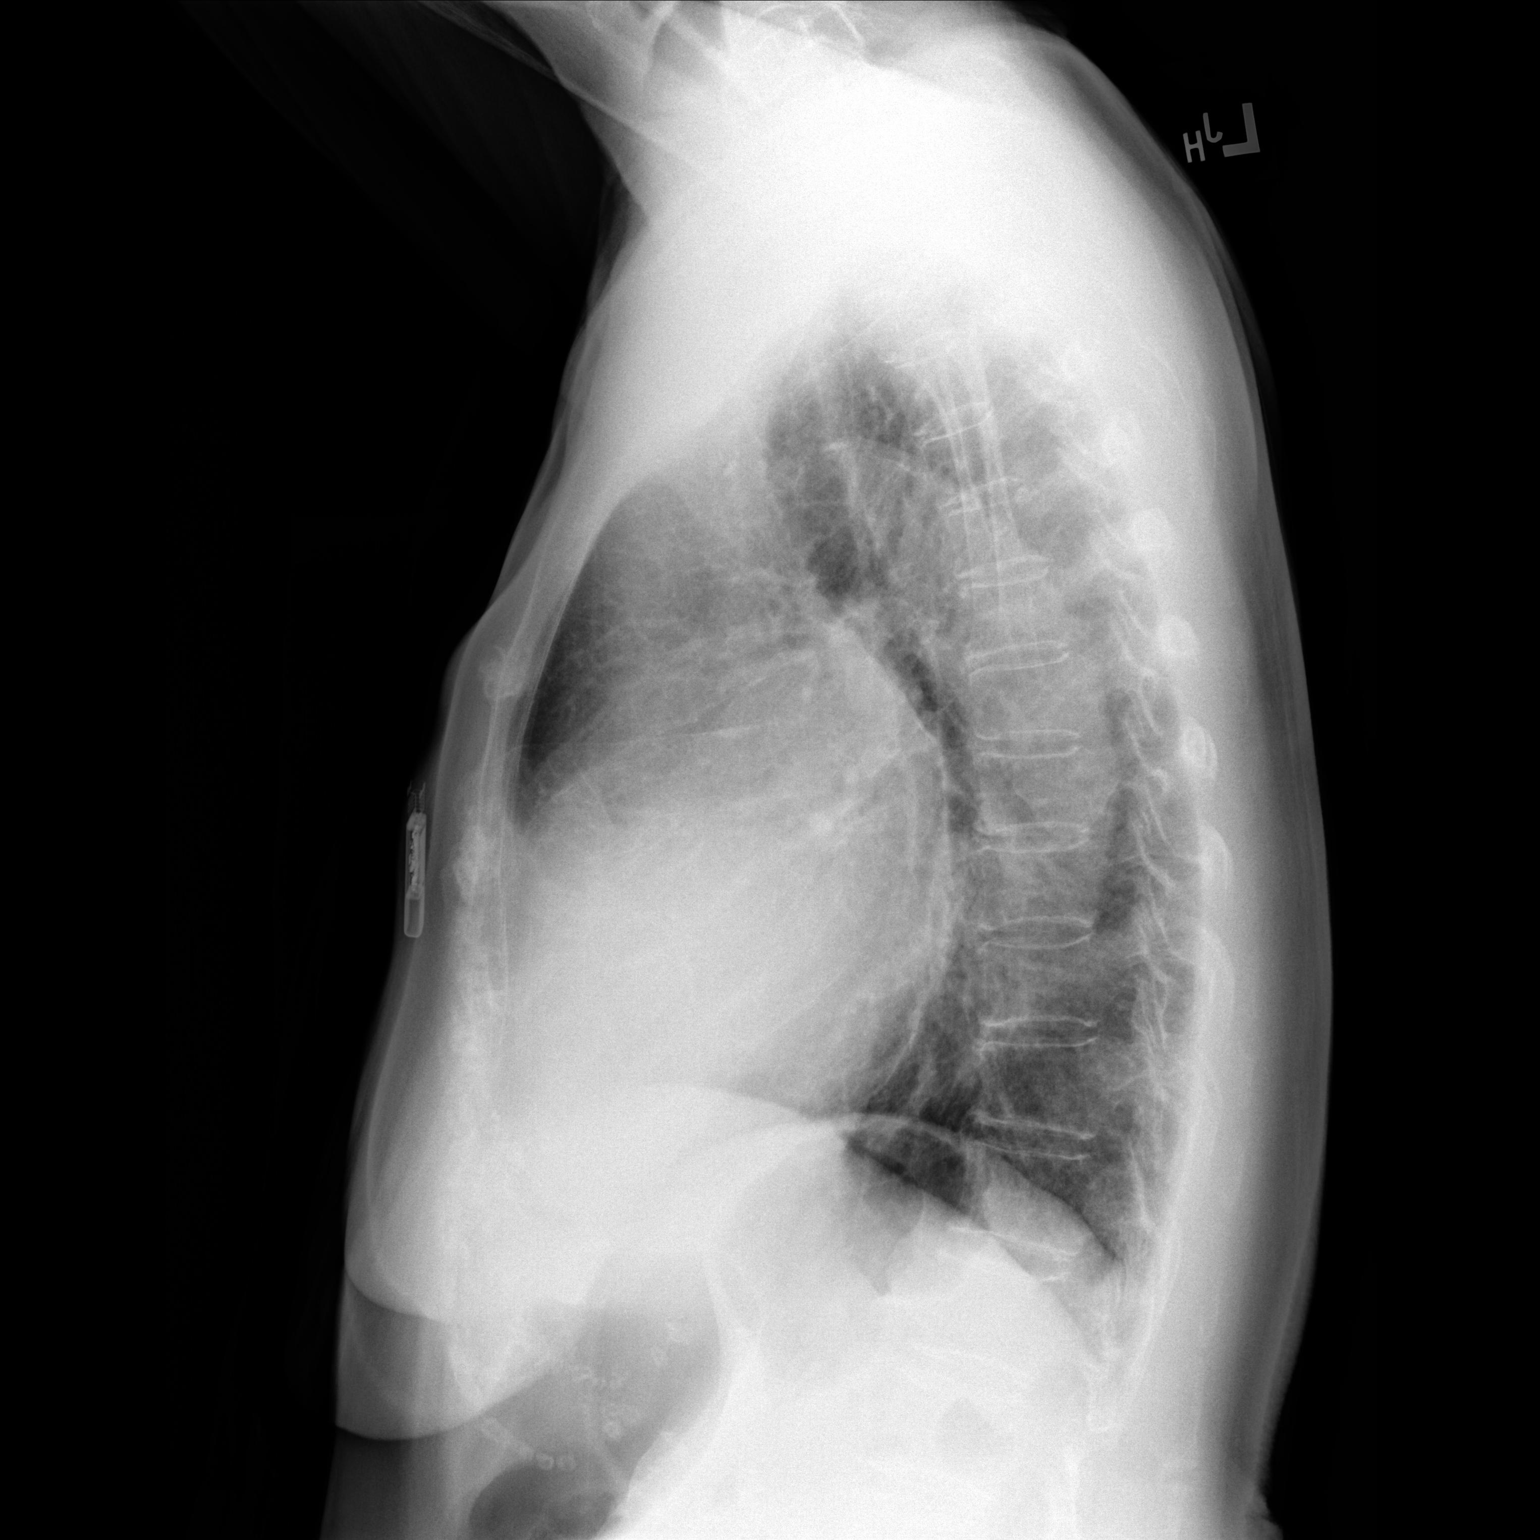

[2 of 2 positions shown; findings below may reference images not displayed]

FINDINGS: Mild cardiomegaly is noted. No pneumothorax or pleural effusion is
noted. No consolidative process is noted. Bony thorax is
unremarkable.
IMPRESSION: No active cardiopulmonary disease.

## 2022-05-17 IMAGING — CR DG BONE SURVEY MET
9 of 10 series · 9 of 10 positions shown · non-contrast
Comparison: Chest x-ray 02/21/2020.  CT 04/01/2019.

CLINICAL DATA: Evaluation for multiple myeloma.

EXAM:
METASTATIC BONE SURVEY

[skull lat]
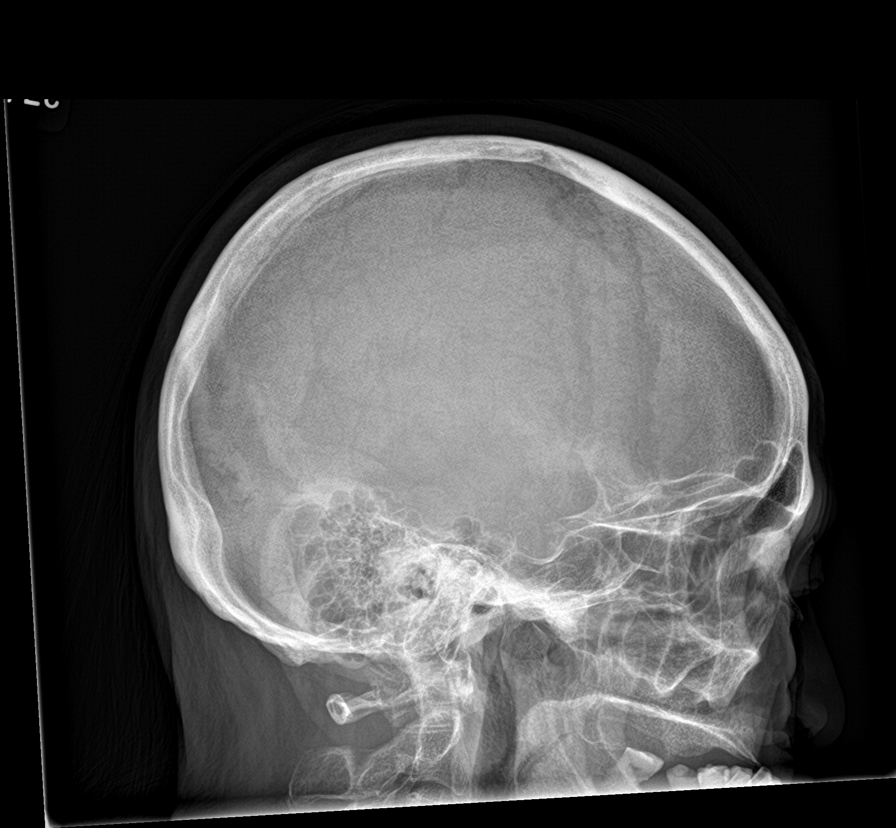

[shoulder ap (1 of 2)]
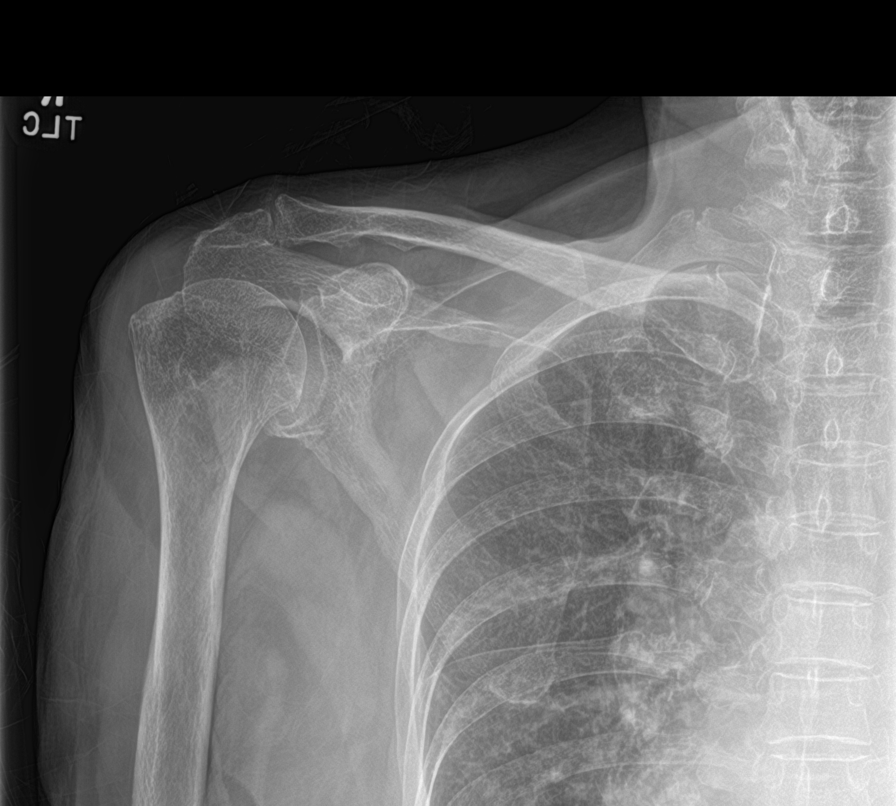

[shoulder ap (2 of 2)]
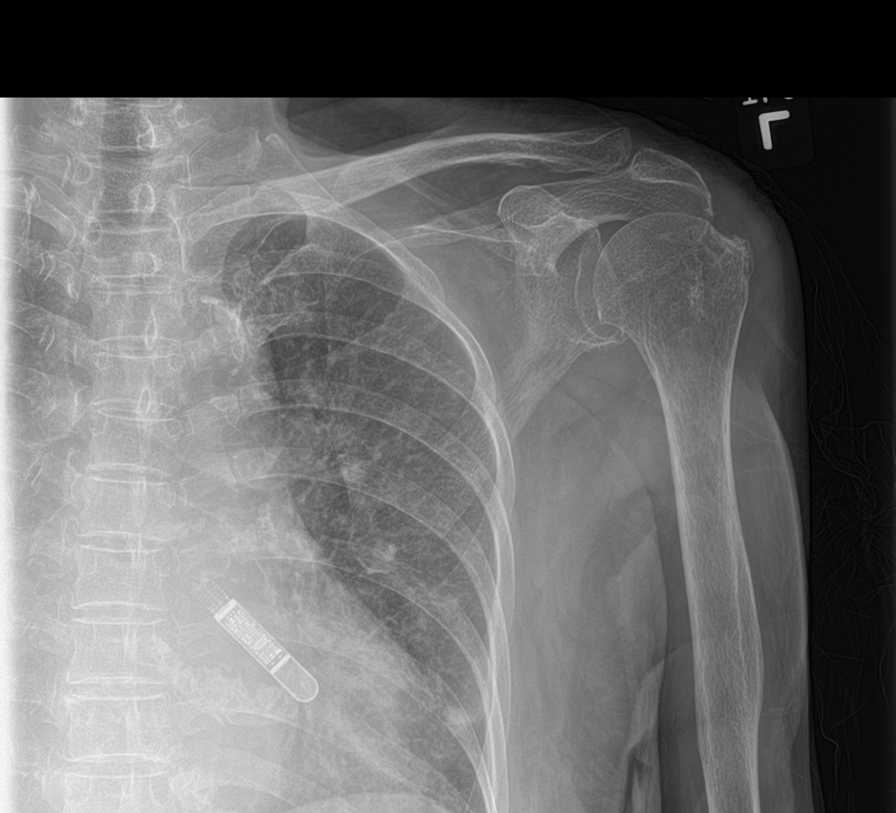

[humerus ap (1 of 2)]
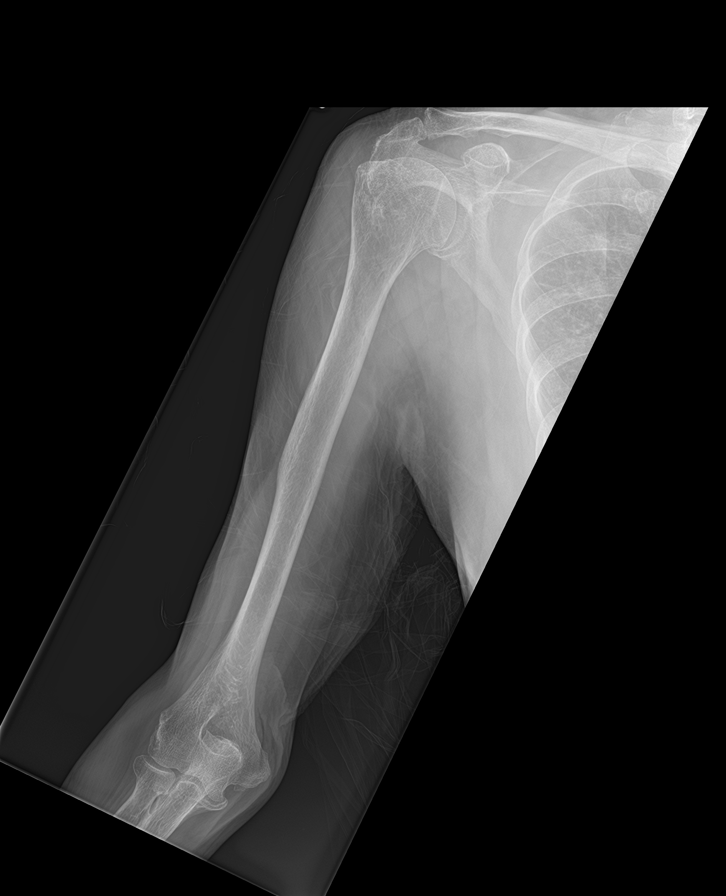

[humerus ap (2 of 2)]
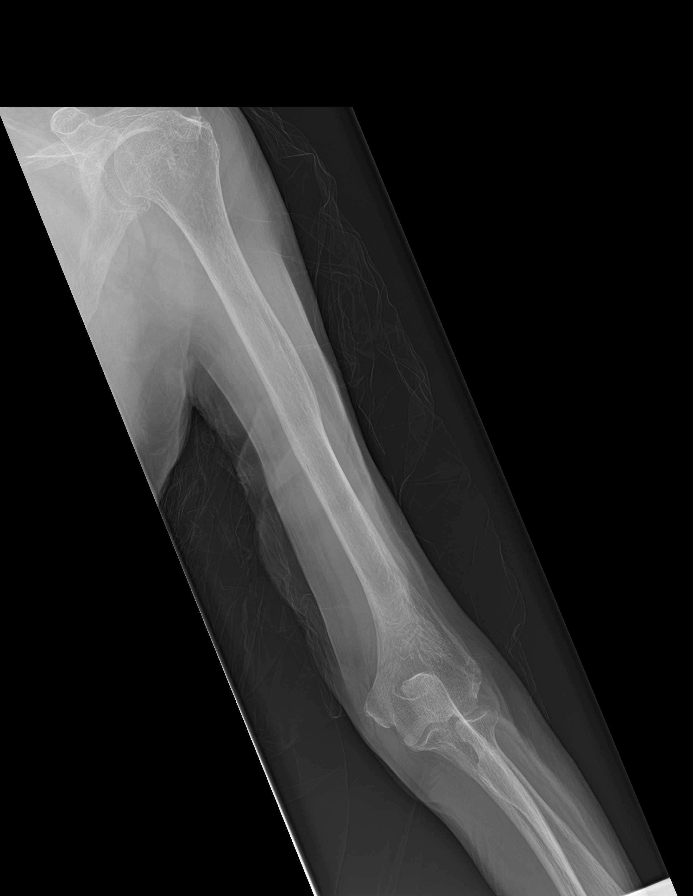

[forearm ap (1 of 2)]
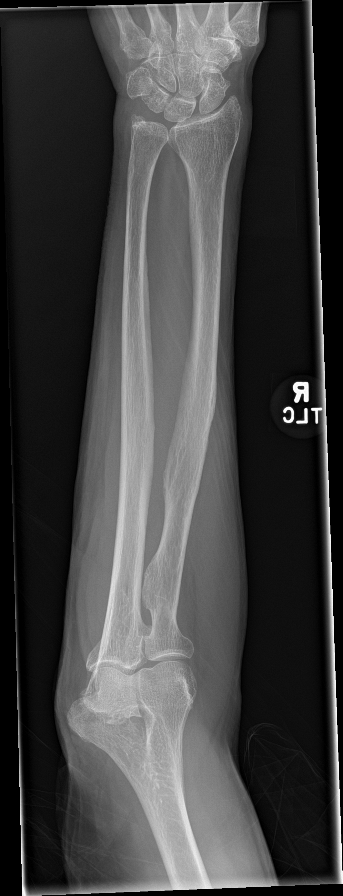

[forearm ap (2 of 2)]
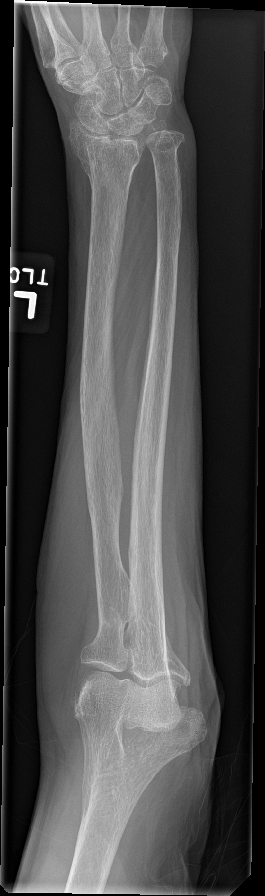

[c-spine ap]
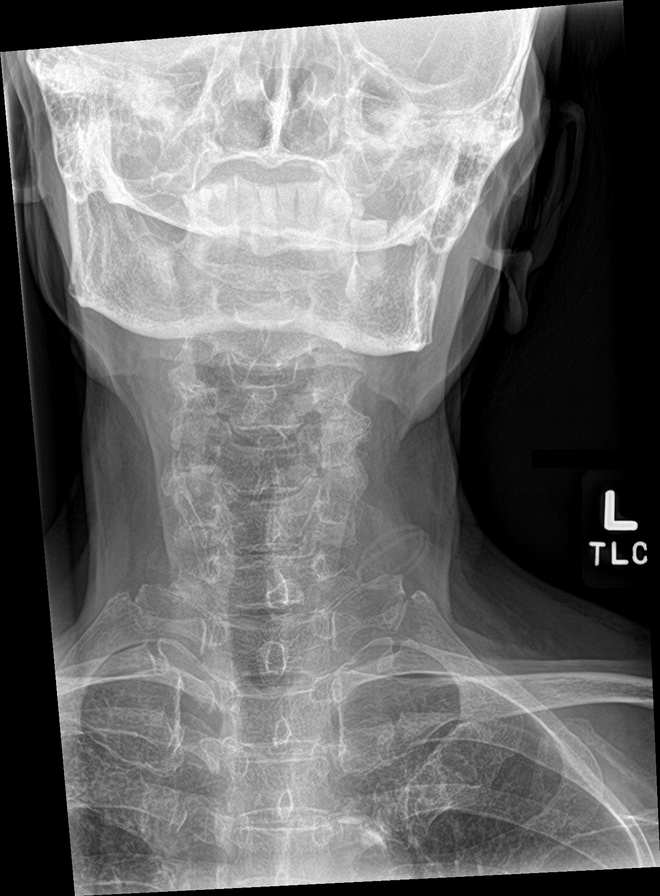

[c-spine lat]
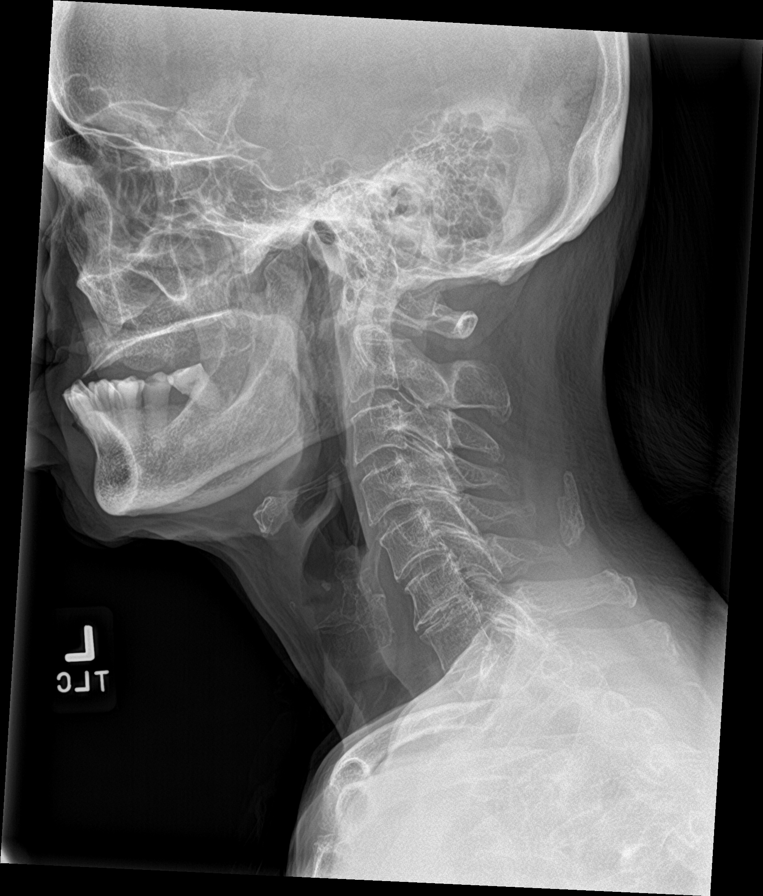

[9 of 10 positions shown; findings below may reference images not displayed]

FINDINGS: Standard metastatic bone survey obtained with imaging of the axial
and appendicular skeleton. Diffuse severe osteopenia. Diffuse
degenerative change noted about the axial and appendicular skeleton.
Cardiac loop recorder noted. Cardiomegaly. No pulmonary venous
congestion. Small density noted over the left lung base on left
shoulder image, this clears on chest x-ray most likely represents
small area of atelectasis. No focal bony abnormalities.
IMPRESSION: 1. Diffuse severe osteopenia. Diffuse degenerative changes noted
about the axial and appendicular skeleton. No focal bony lesions
identified.

2. Cardiac loop recorder noted. Cardiomegaly. No pulmonary venous
congestion.

## 2023-06-16 NOTE — Telephone Encounter (Signed)
error
